# Patient Record
Sex: Female | Born: 1985 | Race: White | Hispanic: No | Smoking: Never smoker
Health system: Southern US, Community
[De-identification: ages and names within clinical notes are randomized; demographics above are authoritative.]

## PROBLEM LIST (undated history)

## (undated) DIAGNOSIS — R000 Tachycardia, unspecified: Secondary | ICD-10-CM

## (undated) DIAGNOSIS — Z8619 Personal history of other infectious and parasitic diseases: Secondary | ICD-10-CM

## (undated) DIAGNOSIS — T7840XA Allergy, unspecified, initial encounter: Secondary | ICD-10-CM

## (undated) DIAGNOSIS — K219 Gastro-esophageal reflux disease without esophagitis: Secondary | ICD-10-CM

## (undated) DIAGNOSIS — J386 Stenosis of larynx: Secondary | ICD-10-CM

## (undated) DIAGNOSIS — I10 Essential (primary) hypertension: Secondary | ICD-10-CM

## (undated) DIAGNOSIS — Z8041 Family history of malignant neoplasm of ovary: Secondary | ICD-10-CM

## (undated) DIAGNOSIS — O24419 Gestational diabetes mellitus in pregnancy, unspecified control: Secondary | ICD-10-CM

## (undated) HISTORY — DX: Personal history of other infectious and parasitic diseases: Z86.19

## (undated) HISTORY — DX: Gestational diabetes mellitus in pregnancy, unspecified control: O24.419

## (undated) HISTORY — DX: Family history of malignant neoplasm of ovary: Z80.41

## (undated) HISTORY — PX: TONSILLECTOMY: SUR1361

## (undated) HISTORY — DX: Essential (primary) hypertension: I10

## (undated) HISTORY — DX: Allergy, unspecified, initial encounter: T78.40XA

---
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
## 2005-01-04 ENCOUNTER — Emergency Department: Payer: Self-pay | Admitting: Emergency Medicine

## 2010-07-06 ENCOUNTER — Ambulatory Visit: Payer: Self-pay | Admitting: Family Medicine

## 2010-07-06 DIAGNOSIS — Z9189 Other specified personal risk factors, not elsewhere classified: Secondary | ICD-10-CM | POA: Insufficient documentation

## 2010-07-06 DIAGNOSIS — J301 Allergic rhinitis due to pollen: Secondary | ICD-10-CM

## 2010-07-06 DIAGNOSIS — F329 Major depressive disorder, single episode, unspecified: Secondary | ICD-10-CM

## 2010-07-14 ENCOUNTER — Ambulatory Visit: Payer: Self-pay | Admitting: Psychology

## 2010-08-11 ENCOUNTER — Ambulatory Visit: Payer: Self-pay | Admitting: Psychology

## 2010-09-16 ENCOUNTER — Ambulatory Visit: Admit: 2010-09-16 | Payer: Self-pay | Admitting: Psychology

## 2010-10-07 NOTE — Assessment & Plan Note (Signed)
Summary: 11:30 NEW PT PER DR COPLAND/CLE   Vital Signs:  Patient profile:   25 year old female Height:      68 inches Weight:      192.0 pounds BMI:     29.30 Temp:     97.8 degrees F oral Pulse rate:   76 / minute Pulse rhythm:   regular BP sitting:   120 / 78  (left arm)  Vitals Entered By: Mallory Pearson CMA Mallory Pearson) (July 06, 2010 11:32 AM)  History of Present Illness: Chief complaint new patient to be established   25 year old female:  Has beeen feeling beside herself.  Not herself. Crying. Went to the gym the other day.   Works about  40 hours a week at [employer] Thirty hours a week at [employer].  Mom and Dad is next door Crying some at work  Feels tired and not want to friends will make her    Preventive Screening-Counseling & Management  Alcohol-Tobacco     Smoking Status: never  Caffeine-Diet-Exercise     Does Patient Exercise: yes      Drug Use:  no.    Allergies (verified): 1)  ! Sulfa  Past History:  Past medical, surgical, family and social histories (including risk factors) reviewed, and no changes noted (except as noted below).  Past Medical History: Current Problems:  ALLERGIC RHINITIS, SEASONAL (ICD-477.0) CHICKENPOX, HX OF (ICD-V15.9)    Past Surgical History: tonsils and adenoids 2003 knee 2005  Family History: Reviewed history and no changes required.  Social History: Reviewed history and no changes required. Occupation:cna Single Never Smoked Alcohol use-yes Drug use-no Regular exercise-yes Occupation:  employed Smoking Status:  never Drug Use:  no Does Patient Exercise:  yes  Review of Systems Psych:  Complains of anxiety, depression, easily tearful, and irritability.  Physical Exam  General:  Well-developed,well-nourished,in no acute distress; alert,appropriate and cooperative throughout examination Head:  Normocephalic and atraumatic without obvious abnormalities. No apparent alopecia or balding. Ears:  no  external deformities.   Nose:  no external deformity.   Lungs:  Normal respiratory effort, chest expands symmetrically. Lungs are clear to auscultation, no crackles or wheezes. Heart:  Normal rate and regular rhythm. S1 and S2 normal without gallop, murmur, click, rub or other extra sounds. Psych:  labile affect and tearful.     Impression & Recommendations:  Problem # 1:  DEPRESSION (ICD-311) Assessment New >30 minutes spent in face to face time with patient, >50% spent in counselling or coordination of care: asked to see person acutely for new onset depression. For at least a month, crying all the time, does not feel herself. Sleeping 5-10 hours. Decreased interest and energy. Working 70+ hours a week, [place] and at [employer] buy. 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 No guns at home. No h/o psych hosp or suicide in the family. No hallucinations. No HI. Has had some fleeting thoughts of suicide, but no active plan, "would never actually do that," but some fleeting thoughts. With permission, called and spoke to the patient's mother, who I know as a pt. [employer]ory [employer] is going to spend the night with her mother and father to ensure that someone is around if needed at night over the next few weeks. They live next door. Start Celexa and close f/u with Dr. Laymond Pearson next week. To call if acutely doing worse. If worsens, she knows about psychiatric admission, ER.  Her updated medication list for this problem includes:    Citalopram  Hydrobromide 20 Mg Tabs (Citalopram hydrobromide) .Marland Kitchen... 1 by mouth daily  Orders: Psychology Referral (Psychology)  Complete Medication List: 1)  Citalopram Hydrobromide 20 Mg Tabs (Citalopram hydrobromide) .Marland Kitchen.. 1 by mouth daily  Patient Instructions: 1)  Referral Appointment Information 2)  Day/Date: 3)  Time: 4)  Place/MD: 5)  Address: 6)  Phone/Fax: 7)  Patient given appointment information. Information/Orders faxed/mailed.  8)  FOLLOW-UP IN 2 WEEKS Prescriptions: CITALOPRAM  HYDROBROMIDE 20 MG TABS (CITALOPRAM HYDROBROMIDE) 1 by mouth daily  #30 x 0   Entered and Authorized by:   Mallory Beat MD   Signed by:   Mallory Beat MD on 07/06/2010   Method used:   Print then Give to Patient   RxID:   [id number]    Orders Added: 1)  Psychology Referral [Psychology] 2)  New Patient Level III [14782]    Prior Medications (reviewed today): None Current Allergies (reviewed today): ! SULFA

## 2011-06-07 ENCOUNTER — Encounter: Payer: Self-pay | Admitting: Family Medicine

## 2011-06-08 ENCOUNTER — Encounter: Payer: Self-pay | Admitting: Family Medicine

## 2011-06-08 ENCOUNTER — Ambulatory Visit (INDEPENDENT_AMBULATORY_CARE_PROVIDER_SITE_OTHER): Payer: Managed Care, Other (non HMO) | Admitting: Family Medicine

## 2011-06-08 VITALS — BP 130/80 | HR 127 | Temp 98.4°F | Ht 67.0 in | Wt 212.4 lb

## 2011-06-08 DIAGNOSIS — R51 Headache: Secondary | ICD-10-CM

## 2011-06-08 DIAGNOSIS — R030 Elevated blood-pressure reading, without diagnosis of hypertension: Secondary | ICD-10-CM

## 2011-06-08 MED ORDER — LABETALOL HCL 100 MG PO TABS: 100.0000 mg | ORAL_TABLET | Freq: Two times a day (BID) | ORAL | Status: DC

## 2011-06-08 NOTE — Patient Instructions (Signed)
F/u with OB in 2 weeks

## 2011-06-08 NOTE — Progress Notes (Signed)
  Subjective:    Patient ID: Mallory Pearson, female    DOB: 1985-12-15, 25 y.o.   MRN: [id number]  HPI  Mallory Pearson, a 25 y.o. female presents today in the office for the following:    Patient well known who presents with repetitive blood pressures elevated above the goal of 130/80 during intrauterine pregnancy. She is [redacted] weeks pregnant. She is also been having some headaches over the last couple of weeks. These are not debilitating, do not have any aura or photophobia or photophobia.  F/u ? HTN: [redacted] weeks pregnant  OB is [facility]. And also Dr. Chauncey Cruel.  Tried caffeine, headaches would not go away. BP was 134/94 Was monitoring it at home -- has had some bad headaches --- was 150/96 150/88 at that point.  135/80  The PMH, PSH, Social History, Family History, Medications, and allergies have been reviewed in [facility], and have been updated if relevant.   Review of Systems ROS: GEN: No acute illnesses, no fevers, chills. GI: No n/v/d, eating normally Pulm: No SOB Interactive and getting along well at home.  Otherwise, ROS is as per the HPI.     Objective:   Physical Exam   Physical Exam  Blood pressure 130/80, pulse 127, temperature 98.4 F (36.9 C), temperature source Oral, height 5\' 7"  (1.702 m), weight 212 lb 6.4 oz (96.344 kg), SpO2 98.00%.  GEN: WDWN, NAD, Non-toxic, A & O x 3 HEENT: Atraumatic, Normocephalic. Neck supple. No masses, No LAD. Ears and Nose: No external deformity. CV: RRR, No M/G/R. No JVD. No thrill. No extra heart sounds. PULM: CTA B, no wheezes, crackles, rhonchi. No retractions. No resp. distress. No accessory muscle use EXTR: No c/c/e NEURO Normal gait.  PSYCH: Normally interactive. Conversant. Not depressed or anxious appearing.  Calm demeanor.        Assessment & Plan:   1. Elevated blood pressure   2. Headache     It is unclear at this is baseline hypertension or pregnancy-induced hypertension. Nevertheless during interuterine pregnancy at 13  weeks, recommendations would be to treat this repetitive elevated blood pressure. I have initiated the patient on labetalol 100 mg p.o. B.i.d. Also asked her to follow up with her obstetrician in 2 weeks.  Also wrote a letter to her obstetrician explaining the situation, findings, and starting her on labetalol.  I reassured her about her headaches, they may be related to her blood pressure. Certainly aside from controlling her blood pressure and using some occasional Tylenol, no other treatment is needed.

## 2011-08-25 ENCOUNTER — Encounter: Payer: Self-pay | Admitting: Family Medicine

## 2011-08-25 ENCOUNTER — Ambulatory Visit (INDEPENDENT_AMBULATORY_CARE_PROVIDER_SITE_OTHER): Payer: BC Managed Care – PPO | Admitting: Family Medicine

## 2011-08-25 VITALS — BP 120/72 | HR 112 | Temp 98.6°F | Ht 66.0 in | Wt 226.8 lb

## 2011-08-25 DIAGNOSIS — M654 Radial styloid tenosynovitis [de Quervain]: Secondary | ICD-10-CM

## 2011-08-25 DIAGNOSIS — G5600 Carpal tunnel syndrome, unspecified upper limb: Secondary | ICD-10-CM

## 2011-08-25 DIAGNOSIS — O139 Gestational [pregnancy-induced] hypertension without significant proteinuria, unspecified trimester: Secondary | ICD-10-CM

## 2011-08-25 DIAGNOSIS — G5603 Carpal tunnel syndrome, bilateral upper limbs: Secondary | ICD-10-CM

## 2011-08-25 DIAGNOSIS — IMO0002 Reserved for concepts with insufficient information to code with codable children: Secondary | ICD-10-CM

## 2011-08-25 MED ORDER — LABETALOL HCL 100 MG PO TABS: 100.0000 mg | ORAL_TABLET | Freq: Two times a day (BID) | ORAL | Status: DC

## 2011-08-25 NOTE — Progress Notes (Signed)
  Patient Name: Mallory Pearson Date of Birth: 09-14-1985 Age: 25 y.o. Medical Record Number: [id number] Gender: female  History of Present Illness:  Mallory Pearson is a 25 y.o. very pleasant female patient who presents with the following:  Pleasant female who is not bilateral hand numbness at all times, but is presented when the patient became pregnant. She is [redacted] weeks pregnant. She is at no trauma or injury.  She also has some pain with motion at the in the first dorsal compartment. 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  Pregnancy-induced hypertension, stable on current labetalol dose. No side effects.  Past Medical History, Surgical History, Social History, Family History, and Problem List have been reviewed in EHR and updated if relevant.  Review of Systems:  GEN: No fevers, chills. Nontoxic. Primarily MSK c/o today. MSK: Detailed in the HPI GI: tolerating PO intake without difficulty Neuro: detailed above Otherwise the pertinent positives of the ROS are noted above.   Physical Examination: Filed Vitals:   08/25/11 1207  BP: 120/72  Pulse: 112  Temp: 98.6 F (37 C)  TempSrc: Oral  Height: 5\' 6"  (1.676 m)  Weight: 226 lb 12.8 oz (102.876 kg)  SpO2: 99%    Body mass index is 36.61 kg/(m^2).   GEN: WDWN, NAD, Non-toxic, Alert & Oriented x 3 HEENT: Atraumatic, Normocephalic.  Ears and Nose: No external deformity. EXTR: No clubbing/cyanosis/edema NEURO: Normal gait.  PSYCH: Normally interactive. Conversant. Not depressed or anxious appearing.  Calm demeanor.   B hand Ecchymosis or edema: neg ROM wrist/hand/digits: full  Carpals, MCP's, digits: NT Distal Ulna and Radius: NT Ecchymosis or edema: neg No instability Cysts/nodules: There is a cyst on the right hand around the medial aspect of the radius distally. This does cause some discomfort with motion. Digit triggering: neg Finkelstein's test: pos Snuffbox tenderness: neg Scaphoid tubercle:  NT Resisted supination: NT Full composite fist, no malrotation Grip, all digits: 5/5 str DIPJT: NT PIP JT: NT MCP JT: NT No tenosynovitis Axial load test: neg Phalen's: pos Tinel's: pos Atrophy: neg  Hand sensation: intact   Assessment and Plan: 1. Carpal tunnel syndrome on both sides   2. DeQuervain's disease (tenosynovitis)   3. Pregnancy induced hypertension     Suspected carpal tunnel syndrome and nerve entrapment is likely pregnancy-induced. Certainly this often happens where edema from pregnancy and traps or compresses the median nerve. Advised her to continue conservative management until several months after her pregnancy. I placed her in a thumb spica splint bilaterally given that she also has de Quervain's tenosynovitis. She also takes Tylenol when necessary.  Refill labetalol

## 2011-11-12 ENCOUNTER — Observation Stay: Payer: Self-pay

## 2011-11-12 LAB — PIH PROFILE
Anion Gap: 12 (ref 7–16)
Chloride: 106 mmol/L (ref 98–107)
Co2: 21 mmol/L (ref 21–32)
Creatinine: 0.65 mg/dL (ref 0.60–1.30)
MCV: 86 fL (ref 80–100)
Potassium: 4.1 mmol/L (ref 3.5–5.1)
RBC: 3.97 10*6/uL (ref 3.80–5.20)
RDW: 13.1 % (ref 11.5–14.5)
Sodium: 139 mmol/L (ref 136–145)
Uric Acid: 4.8 mg/dL (ref 2.6–6.0)
WBC: 10.4 10*3/uL (ref 3.6–11.0)

## 2011-11-12 LAB — PROTEIN / CREATININE RATIO, URINE: Protein, Random Urine: 34 mg/dL — ABNORMAL HIGH (ref 0–12)

## 2011-11-18 ENCOUNTER — Inpatient Hospital Stay: Payer: Self-pay

## 2011-11-18 LAB — PIH PROFILE
Anion Gap: 12 (ref 7–16)
BUN: 13 mg/dL (ref 7–18)
Chloride: 105 mmol/L (ref 98–107)
Creatinine: 0.8 mg/dL (ref 0.60–1.30)
HCT: 33.2 % — ABNORMAL LOW (ref 35.0–47.0)
HGB: 11.4 g/dL — ABNORMAL LOW (ref 12.0–16.0)
MCHC: 34.2 g/dL (ref 32.0–36.0)
RBC: 3.89 10*6/uL (ref 3.80–5.20)
SGOT(AST): 20 U/L (ref 15–37)
WBC: 7.7 10*3/uL (ref 3.6–11.0)

## 2011-11-18 LAB — PROTEIN / CREATININE RATIO, URINE
Creatinine, Urine: 91.5 mg/dL (ref 30.0–125.0)
Protein, Random Urine: 71 mg/dL — ABNORMAL HIGH (ref 0–12)

## 2011-11-19 LAB — PROTEIN, URINE, 24 HOUR
Collection Hours: 24 hours
Protein, 24 Hour Urine: 782 mg/24HR — ABNORMAL HIGH (ref 30–149)
Protein, Urine: 34 mg/dL (ref 0–12)
Total Volume: 2300 mL

## 2011-11-19 LAB — CREATININE CLEARANCE, URINE, 24 HOUR
Creatinine Clearance: 142 mL/min — ABNORMAL HIGH (ref 80–130)
Creatinine, Urine: 89.3 mg/dL (ref 30.0–125.0)
Total Volume: 2300 mL

## 2011-11-21 LAB — PIH PROFILE
Anion Gap: 11 (ref 7–16)
Calcium, Total: 9 mg/dL (ref 8.5–10.1)
Chloride: 106 mmol/L (ref 98–107)
Co2: 22 mmol/L (ref 21–32)
Creatinine: 0.66 mg/dL (ref 0.60–1.30)
MCH: 28.9 pg (ref 26.0–34.0)
MCHC: 33.7 g/dL (ref 32.0–36.0)
MCV: 86 fL (ref 80–100)
Osmolality: 276 (ref 275–301)
Platelet: 174 10*3/uL (ref 150–440)
Potassium: 3.9 mmol/L (ref 3.5–5.1)
RBC: 3.9 10*6/uL (ref 3.80–5.20)
RDW: 13.3 % (ref 11.5–14.5)
Uric Acid: 5.5 mg/dL (ref 2.6–6.0)

## 2011-11-22 LAB — PROTEIN / CREATININE RATIO, URINE: Protein/Creat. Ratio: 370 mg/gCREAT — ABNORMAL HIGH (ref 0–200)

## 2011-11-23 LAB — PIH PROFILE
Anion Gap: 15 (ref 7–16)
Calcium, Total: 8.8 mg/dL (ref 8.5–10.1)
Chloride: 107 mmol/L (ref 98–107)
Co2: 20 mmol/L — ABNORMAL LOW (ref 21–32)
Glucose: 80 mg/dL (ref 65–99)
HCT: 33.1 % — ABNORMAL LOW (ref 35.0–47.0)
MCH: 28.6 pg (ref 26.0–34.0)
MCHC: 33.1 g/dL (ref 32.0–36.0)
MCV: 86 fL (ref 80–100)
Osmolality: 282 (ref 275–301)
Platelet: 168 10*3/uL (ref 150–440)
Potassium: 4.1 mmol/L (ref 3.5–5.1)
RDW: 13.2 % (ref 11.5–14.5)
SGOT(AST): 18 U/L (ref 15–37)
Sodium: 142 mmol/L (ref 136–145)
Uric Acid: 5.7 mg/dL (ref 2.6–6.0)
WBC: 9.4 10*3/uL (ref 3.6–11.0)

## 2011-11-25 LAB — CBC WITH DIFFERENTIAL/PLATELET
Basophil %: 0.2 %
Eosinophil #: 0 10*3/uL (ref 0.0–0.7)
Eosinophil %: 0.4 %
HCT: 34.5 % — ABNORMAL LOW (ref 35.0–47.0)
HGB: 11.9 g/dL — ABNORMAL LOW (ref 12.0–16.0)
Lymphocyte #: 1.5 10*3/uL (ref 1.0–3.6)
Lymphocyte %: 14.8 %
MCHC: 34.6 g/dL (ref 32.0–36.0)
MCV: 84 fL (ref 80–100)
Monocyte #: 0.7 10*3/uL (ref 0.0–0.7)
Monocyte %: 7.1 %
Neutrophil #: 7.8 10*3/uL — ABNORMAL HIGH (ref 1.4–6.5)
RBC: 4.1 10*6/uL (ref 3.80–5.20)
WBC: 10.1 10*3/uL (ref 3.6–11.0)

## 2011-11-26 DIAGNOSIS — O10919 Unspecified pre-existing hypertension complicating pregnancy, unspecified trimester: Secondary | ICD-10-CM

## 2011-11-27 LAB — HEMATOCRIT: HCT: 30.1 % — ABNORMAL LOW (ref 35.0–47.0)

## 2014-12-29 NOTE — Op Note (Signed)
PATIENT NAME:  Mallory Pearson, Mallory Pearson MR#:  [id number] DATE OF BIRTH:  Feb 14, 1986  DATE OF PROCEDURE:  11/26/2011  PREOPERATIVE DIAGNOSES: Term intrauterine pregnancy, hypertension, preeclampsia, failure to progress.   POSTOPERATIVE DIAGNOSES: Term intrauterine pregnancy, hypertension, preeclampsia, failure to progress.   PROCEDURE PERFORMED: Low transverse cesarean section.   SURGEON: Glean Salen, M.D.   ASSISTANT: Prentice Docker, M.D.  ANESTHESIA: Spinal.   ESTIMATED BLOOD LOSS: 500 mL.   COMPLICATIONS: None.   FINDINGS: Normal tubes, ovaries, and uterus. Viable female infant weighing 5 pounds 14 ounces with Apgar scores of 8 and 9 at one and five minutes, respectively.   DISPOSITION: To the recovery room in stable condition.   TECHNIQUE: The patient is prepped and draped in the usual sterile fashion after adequate anesthesia is obtained in the supine position on the operating room table. A skin incision is made with a scalpel that is extended in the horizontal fashion. The incision is carried down to the level of the rectus fascia which is then dissected bilaterally using Mayo scissors. The rectus muscles are dissected away from the rectus fascia and then separated in the midline, the peritoneum is penetrated and the bladder inferiorly retracted after careful dissection of the lower uterine segment. A scalpel is used to create a low transverse hysterotomy incision that is extended by blunt dissection and then amniotomy reveals clear fluid. The infant is delivered with the use of a vacuum and the nuchal cord is reduced. The infant is completely delivered and the umbilical cord is clamped and cut.   Cord blood is obtained, the placenta is manually extracted, and the uterus is externalized and cleansed of all membranes and debris using a moist sponge. The hysterotomy incision is closed with running #1 Vicryl suture in a locking fashion followed by a second layer to imbricate the first layer  with excellent hemostasis noted. The uterus is then placed back in the intra-abdominal cavity and the paracolic gutters are irrigated using warm saline. Re-examination of the incision reveals excellent hemostasis. Interceed is placed over the uterine incision. The peritoneum is closed with Vicryl suture. Two trocars are placed through the abdomen into the subfascial space with guidance of the Silver Soaker catheters into place. The fascia is closed with 0 Maxon suture with careful placement of sutures to avoid the catheters.  Subcutaneous tissues are irrigated with hemostasis assured using electrocautery. Skin is closed with subcuticular closure followed by Dermabond. The catheters are held in place by Dermabond and Steri-Strips. The patient goes to the Recovery Room in stable condition. All sponge, instrument, and needle counts are correct.  ____________________________ R. Barnett Applebaum, MD rph:slb D: 11/26/2011 16:55:40 ET T: 11/26/2011 17:13:24 ET JOB#: [id number]  cc: Glean Salen, MD, <Dictator> Gae Dry MD ELECTRONICALLY SIGNED 11/30/2011 9:51

## 2015-01-14 NOTE — H&P (Signed)
L&D Evaluation:  History Expanded:   HPI 29 yo G3P0020 whose EDC - 12/15/11.  PT's [renatal course complicated by developement of hypertension for which she now takes labetolol.  Pt referred from [facility] for a Bp 140/100    Blood Type A positive    Maternal HIV Negative    Maternal Syphilis Ab Nonreactive    Maternal Varicella Immune    Rubella Results immune    [facility] 15-Dec-2011    Patient's Medical History No Chronic Illness    Patient's Surgical History L knee surgery, eye, T+A    Medications Pre Natal Vitamins  Labetolol 200 bid    Allergies Sulfa, Hives    Social History none   ROS:   ROS All systems were reviewed.  HEENT, CNS, GI, GU, Respiratory, CV, Renal and Musculoskeletal systems were found to be normal.   Exam:   Vital Signs stable  127/80, 126/67    Urine Protein negative dipstick    General no apparent distress    Mental Status clear    Chest clear    Heart normal sinus rhythm    Abdomen gravid, non-tender    Estimated Fetal Weight Average for gestational age    Edema 1+    Reflexes 3+    Clonus negative   Impression:   Impression Poss Early PIH vs Chronic HBp   Plan:   Comments PIH profile, monitor   Electronic Signatures: Rosina Lowenstein (MD)  (Signed 08-Mar-13 13:19)  Authored: L&D Evaluation   Last Updated: 08-Mar-13 13:19 by Rosina Lowenstein (MD)

## 2015-01-14 NOTE — H&P (Signed)
L&D Evaluation:  History:   HPI 29 yo G3P0020 @ 36.1wks EDC 12/15/11 by LMP with CHTN on Labetalol presents from the office for PIH eval.  She was seen for routine visit today where BP was 150/110 with 1+ protein.  She had c/o intermittent dull HA and blurred vision.  Denies nausea/vomiting or pain.  +FM.  She has also had swelling of hands/feet.  BP has been mildly elevated to normal since admission.    Presents with elevated BP and proteinuria    Patient's Medical History Hypertension    Patient's Surgical History Knee surgery    Medications Pre Natal Vitamins  Labetalol 200mg  bid    Allergies Sulfa, (hives)    Social History none    Family History HTN   ROS:   ROS see HPI   Exam:   Vital Signs BP >140/90    Urine Protein 1+    General no apparent distress    Mental Status clear    Chest clear    Heart normal sinus rhythm    Abdomen gravid, non-tender    Edema 2+  Pitting    Pelvic Deferred    Mebranes Intact    Description clear    FHT normal rate with no decels    Ucx absent    Skin dry    Other PIH panel WNL except increase in P/C ratio from 502 to 776 in 1 wk.   Impression:   Impression Worsening CHTN vs CHTN with superimposed preeclampsia vs mild preeclampisa   Plan:   Comments 1)  Increase Labetalol to 300mg  bid.  Monitor BP and for s/sx of preeclampsia. 2)  Obs with 24hr urine collection. 3)  Discussed POC with pt and she understands the concern and need for further evaluation with possibility of early delivery if her clinical condition worsens.   Electronic Signatures: Donzetta Matters (MD)  (Signed 14-Mar-13 18:23)  Authored: L&D Evaluation   Last Updated: 14-Mar-13 18:23 by Donzetta Matters (MD)

## 2015-09-02 LAB — OB RESULTS CONSOLE HIV ANTIBODY (ROUTINE TESTING): HIV: NONREACTIVE

## 2015-09-02 LAB — OB RESULTS CONSOLE VARICELLA ZOSTER ANTIBODY, IGG: Varicella: IMMUNE

## 2015-09-02 LAB — OB RESULTS CONSOLE GC/CHLAMYDIA
Chlamydia: NEGATIVE
Gonorrhea: NEGATIVE

## 2015-09-02 LAB — OB RESULTS CONSOLE HEPATITIS B SURFACE ANTIGEN: HEP B S AG: NEGATIVE

## 2015-09-02 LAB — OB RESULTS CONSOLE RUBELLA ANTIBODY, IGM: Rubella: IMMUNE

## 2015-09-02 LAB — OB RESULTS CONSOLE RPR: RPR: NONREACTIVE

## 2015-10-20 ENCOUNTER — Encounter: Payer: BLUE CROSS/BLUE SHIELD | Attending: Obstetrics and Gynecology | Admitting: *Deleted

## 2015-10-20 ENCOUNTER — Encounter: Payer: Self-pay | Admitting: *Deleted

## 2015-10-20 VITALS — BP 136/84 | Ht 67.0 in | Wt 226.5 lb

## 2015-10-20 DIAGNOSIS — O24415 Gestational diabetes mellitus in pregnancy, controlled by oral hypoglycemic drugs: Secondary | ICD-10-CM

## 2015-10-20 DIAGNOSIS — O24419 Gestational diabetes mellitus in pregnancy, unspecified control: Secondary | ICD-10-CM | POA: Diagnosis present

## 2015-10-20 NOTE — Patient Instructions (Signed)
Read booklet on Gestational Diabetes Follow Gestational Meal Planning Guidelines Complete a 3 Day Food Record and bring to next appointment Check blood sugars 4 x day - before breakfast and 2 hrs after every meal and record  Bring blood sugar log to all appointments Purchase urine ketone strips if blood sugars not controlled and check urine ketones every am:  If + increase bedtime snack to 1 protein and 2 carbohydrate servings Walk 20-30 minutes at least 5 x week if permitted by MD  

## 2015-10-21 ENCOUNTER — Encounter: Payer: Self-pay | Admitting: *Deleted

## 2015-10-21 NOTE — Progress Notes (Signed)
Diabetes Self-Management Education  Visit Type: First/Initial  Appt. Start Time: 1505 Appt. End Time: [id number]  10/21/2015  Ms. Mallory Pearson, identified by name and date of birth, is a 30 y.o. female with a diagnosis of Diabetes: Gestational Diabetes.   ASSESSMENT  Blood pressure 136/84, height 5\' 7"  (1.702 m), weight 226 lb 8 oz (102.74 kg), last menstrual period 07/15/2015. Body mass index is 35.47 kg/(m^2).      Diabetes Self-Management Education - 10/20/15 1736    Visit Information   Visit Type First/Initial   Initial Visit   Diabetes Type Gestational Diabetes   Are you currently following a meal plan? Yes   What type of meal plan do you follow? "watching carbs and sugars; try not to eat more than 5 grams of each"   Are you taking your medications as prescribed? Yes   Date Diagnosed February 2017   Health Coping   How would you rate your overall health? Fair   Psychosocial Assessment   Patient Belief/Attitude about Diabetes Motivated to manage diabetes  "bad, nervous"   Self-care barriers None   Self-management support Doctor's office;Family   Patient Concerns Nutrition/Meal planning;Glycemic Control;Weight Control;Problem Solving;Healthy Lifestyle   Special Needs None   Preferred Learning Style Visual   Learning Readiness Change in progress   How often do you need to have someone help you when you read instructions, pamphlets, or other written materials from your doctor or pharmacy? 1 - Never   What is the last grade level you completed in school? BS   Complications   How often do you check your blood sugar? 3-4 times/day   Fasting Blood glucose range (mg/dL) 130-179;70-129  FBG's 119-155 mg/dL   Postprandial Blood glucose range (mg/dL) 130-179;70-129  pp's 105-145 mg/dL   Have you had a dilated eye exam in the past 12 months? Yes   Have you had a dental exam in the past 12 months? Yes   Are you checking your feet? No   Dietary Intake   Breakfast grapes, cheese,  peanuts   Snack (morning) trukey sausage   Lunch chicken, salads   Snack (afternoon) peanuts   Dinner steak and vegetables; chicken; salad   Beverage(s) water   Exercise   Exercise Type ADL's   Patient Education   Previous Diabetes Education No   Disease state  Definition of diabetes, type 1 and 2, and the diagnosis of diabetes   Nutrition management  Role of diet in the treatment of diabetes and the relationship between the three main macronutrients and blood glucose level;Carbohydrate counting   Physical activity and exercise  Role of exercise on diabetes management, blood pressure control and cardiac health.   Medications Reviewed patients medication for diabetes, action, purpose, timing of dose and side effects.   Monitoring Purpose and frequency of SMBG.;Identified appropriate SMBG and/or A1C goals.   Acute complications Taught treatment of hypoglycemia - the 15 rule.   Chronic complications Relationship between chronic complications and blood glucose control   Psychosocial adjustment Identified and addressed patients feelings and concerns about diabetes   Preconception care Pregnancy and GDM  Role of pre-pregnancy blood glucose control on the development of the fetus;Reviewed with patient blood glucose goals with pregnancy   Individualized Goals (developed by patient)   Reducing Risk Improve blood sugars Prevent diabetes complications Lose weight Lead a healthier lifestyle Become more fit   Outcomes   Expected Outcomes Demonstrated interest in learning. Expect positive outcomes      Individualized Plan for Diabetes  Self-Management Training:   Learning Objective:  Patient will have a greater understanding of diabetes self-management. Patient education plan is to attend individual and/or group sessions per assessed needs and concerns.   Plan:   Patient Instructions  Read booklet on Gestational Diabetes Follow Gestational Meal Planning Guidelines Complete a 3 Day Food  Record and bring to next appointment Check blood sugars 4 x day - before breakfast and 2 hrs after every meal and record  Bring blood sugar log to all appointments Purchase urine ketone strips if blood sugars not controlled and check urine ketones every am:  If + increase bedtime snack to 1 protein and 2 carbohydrate servings Walk 20-30 minutes at least 5 x week if permitted by MD  Expected Outcomes:  Demonstrated interest in learning. Expect positive outcomes  Education material provided:  Gestational Booklet Gestational Meal Planning Guidelines Viewed Gestational Diabetes Video Glucose tabs 3 Day Food Record Goals for a Healthy Pregnancy Causes, symptoms and treatments for low blood sugars  If problems or questions, patient to contact team via:   Johny Drilling, [place], [place], CDE [phone]  Future DSME appointment:  November 06, 2015 at 1:00 pm with dietitian

## 2015-11-06 ENCOUNTER — Encounter: Payer: Self-pay | Admitting: Dietician

## 2015-11-06 ENCOUNTER — Encounter: Payer: BLUE CROSS/BLUE SHIELD | Attending: Obstetrics and Gynecology | Admitting: Dietician

## 2015-11-06 VITALS — BP 130/76 | Ht 67.0 in | Wt 227.5 lb

## 2015-11-06 DIAGNOSIS — O24419 Gestational diabetes mellitus in pregnancy, unspecified control: Secondary | ICD-10-CM | POA: Insufficient documentation

## 2015-11-06 DIAGNOSIS — O24415 Gestational diabetes mellitus in pregnancy, controlled by oral hypoglycemic drugs: Secondary | ICD-10-CM

## 2015-11-06 NOTE — Progress Notes (Signed)
   Patient's BG record indicates BGs improved since starting glyburide, but some still above goal; FBGs ranging 92-107mg /dl, and post-meal BGs ranging 78-150.  She reports extra stress, as her husband is currently in [place], and will be there until May. She is trying to manage work, pregnancy, and 61yr-old child by herself.   Patient's food diary indicates healthy food choices, many meals low in carbohydrate.    Provided 1900kcal meal plan, and wrote individualized menus based on patient's food preferences. Encouraged at least 30g carb with each meal, and a total of at least 9 carb servings daily.   Discussed role of stress and exercise on BG control. Encouraged patient to begin with 10-15 min. light exercise if able.   Instructed patient on food safety, including avoidance of Listeriosis, and limiting mercury from fish.  Discussed importance of maintaining healthy lifestyle habits to reduce risk of Type 2 DM as well as Gestational DM with any future pregnancies.  Advised patient to use any remaining testing supplies to test some BGs after delivery, and to have BG tested ideally annually, as well as prior to attempting future pregnancies.

## 2015-11-06 NOTE — Patient Instructions (Signed)
   Begin walking 10-15 minutes at a time.   Include at least 30g carbohydrate with meals.   If you are still hungry after eating, add extra low-carb vegetables or protein food.   Call as needed with any questions or concerns.

## 2016-02-16 ENCOUNTER — Ambulatory Visit: Payer: Managed Care, Other (non HMO)

## 2016-02-16 ENCOUNTER — Ambulatory Visit
Admission: RE | Admit: 2016-02-16 | Discharge: 2016-02-16 | Disposition: A | Discharge status: Home / Self Care | Payer: Managed Care, Other (non HMO) | Source: Ambulatory Visit | Attending: Obstetrics & Gynecology | Admitting: Obstetrics & Gynecology

## 2016-02-16 VITALS — BP 119/80 | HR 119 | Temp 97.5°F | Resp 20 | Ht 66.0 in | Wt 234.0 lb

## 2016-02-16 DIAGNOSIS — O24419 Gestational diabetes mellitus in pregnancy, unspecified control: Secondary | ICD-10-CM

## 2016-02-16 NOTE — Progress Notes (Signed)
Maternal Fetal Medicine Consult   CC: Advice Only  HPI:  Mallory Pearson is a 30 y.o. G3P1010 @ [redacted]w[redacted]d (by LMP c/w 9 wk [place]) who presents for MFM consultation due to suboptimally controlled gestational diabetes type A2.   PMHx: Patient  has a past medical history of Allergy; History of chicken pox; Hypertension; and Gestational diabetes.  PSHx: She  has past surgical history that includes Tonsillectomy and adenoidectomy; Knee surgery (2005); and Cesarean section (11/26/11). Medications:  Current Outpatient Prescriptions on File Prior to Encounter  Medication Sig Dispense Refill  . glyBURIDE (DIABETA) 2.5 MG tablet Take 4 mg by mouth daily with supper.     . Prenatal Vit-Fe Fumarate-FA (MULTIVITAMIN-PRENATAL) 27-0.8 MG TABS Take 1 tablet by mouth daily. Reported on 10/20/2015     No current facility-administered medications on file prior to encounter.   Allergies: is allergic to sulfa antibiotics and sulfonamide derivatives. OBHx:  OB History  Gravida Para Term Preterm AB SAB TAB Ectopic Multiple Living  3 1 1  1 1         # Outcome Date GA Lbr Len/2nd Weight Sex Delivery Anes PTL Lv  3 Current           2 Term      CS-Unspec     1 SAB                FHx: family history is not on file. Soc Hx:  reports that she has never smoked. She has never used smokeless tobacco. She reports that she does not drink alcohol or use illicit drugs.    Objective:  BP 119/80 mmHg  Pulse 119  Temp(Src) 97.5 F (36.4 C)  Resp 20  Ht 5\' 6"  (1.676 m)  Wt 234 lb (106.142 kg)  BMI 37.79 kg/m2  SpO2 99%  LMP 07/15/2015 Temp:  [97.5 F (36.4 C)] 97.5 F (36.4 C) (06/12 1410) Pulse Rate:  [119] 119 (06/12 1410) Resp:  [20] 20 (06/12 1410) BP: (119)/(80) 119/80 mmHg (06/12 1410) SpO2:  [99 %] 99 % (06/12 1410) Weight:  [234 lb (106.142 kg)] 234 lb (106.142 kg) (06/12 1410) Exam Deferred  Assessment & Plan:  Mallory Pearson is a 30 y.o. G3P1010 @ [redacted]w[redacted]d (by LMP c/w 9 wk sono) who presents for MFM  consultation due to suboptimally controlled GDMA2.   Ms. Toon was diagnosed with gestational diabetes based on an abnormal first trimester 1 hr GCT (172) followed by an abnormal 3 hour GTT (107/204/198/145).  Ms. Lyster has been followed by her primary ob group [facility]) and is currently on glyburide 1.25 with lunch and 3.75 in the evening.  Ms. Curatola was previously on 2.5mg  in the morning and 5 mg at night, however her dose was decreased due to "low sugars".  She reports her low sugars were in the mid 60-70's.  She also reports feeling jittery at this glucose level.  Based on her symptoms she was decreased to her current regimen on 01/15/16.  Ms. Norton keeps meticulous logs of her glucose checks.  On review of her log from 5/12 to today (6/12), her fasting sugars are suboptimal (90-100's).  Although 24/32 of her fasting values are out of range the highest is 116. Our typical goal for fasting sugars is 60-90.  Her 2-hour post meal sugars better than the fastings goal ranging from the 80-130's with only 6/32 breakfast, 11/32 lunch and 22/32 values out of range with a max of 162.  Our typical goal for 2  hours post meal glucose is less than 120.    Per Ms. Frieze, her last growth ultrasound on 5/22 was within normal limits at the 75th percentile.  We discussed options to maximize her control including attempting a glyburide increase, switching to metformin or initiating insulin.  I am concerned that Ms. Oman will have more side effects with metformin and would be more likely to have episodes of true hypoglycemia on insulin.  We discussed that her previous "low sugars" are actually considered within the normal range.  After discussing the options we decided to attempt an increase in her glyburide to 5mg  qhs while continuing the 1.25mg  with lunch.  We discussed the natural history of diabetes to worsen during pregnancy therefore she may tolerate this dose better now that she's further along.  If Ms. Veal  is unable to to this increased glyburide dose we are happy to see her again and consider either switching to metformin or very careful insulin titration.  If Ms. Mathus is able to tolerate the increased pm glyburide she may also require gentle increases to her lunch time dose as well.  Only the nighttime dose was increased today to try and minimize symptomatology from the medication.    We further recommend continued serial growth scans ever 4 weeks.  We would also recommend increased monitoring with twice weekly BPP or twice weekly NST with weekly fluid checks starting at 34 weeks. Approximately 30 minutes were spent in face to face consultation and coordination of care.   We are happy to provide further consultation, ultrasounds and/or testing as desired.    Thank you for involving our group in the care of this patient. Louie Bun, MD

## 2016-03-15 ENCOUNTER — Ambulatory Visit
Admission: RE | Admit: 2016-03-15 | Discharge: 2016-03-15 | Disposition: A | Discharge status: Home / Self Care | Payer: Managed Care, Other (non HMO) | Source: Ambulatory Visit | Attending: Maternal and Fetal Medicine | Admitting: Maternal and Fetal Medicine

## 2016-03-15 VITALS — BP 134/80 | HR 128 | Temp 98.4°F | Resp 20 | Ht 66.0 in | Wt 236.0 lb

## 2016-03-15 DIAGNOSIS — O24419 Gestational diabetes mellitus in pregnancy, unspecified control: Secondary | ICD-10-CM

## 2016-03-15 MED ORDER — SYRINGE (DISPOSABLE) 1 ML MISC: Status: DC

## 2016-03-15 MED ORDER — INSULIN NPH (HUMAN) (ISOPHANE) 100 UNIT/ML ~~LOC~~ SUSP: SUBCUTANEOUS | Status: DC

## 2016-03-15 MED ORDER — ALCOHOL PADS 70 % PADS: MEDICATED_PAD | Status: DC

## 2016-03-15 MED ORDER — INSULIN LISPRO 100 UNIT/ML ~~LOC~~ SOLN: SUBCUTANEOUS | Status: DC

## 2016-03-15 NOTE — Progress Notes (Signed)
Dear Dr Leonides Schanz,  Thank you for referring MS Mallory Pearson to [facility] for ongoing management of her gestational diabetes. We reviewed the importance of good glucose control to minimize fetal and maternal risks.   Ms Mallory Pearson has tried glyburide and now is taking both metformin and glyburide with continued suboptimal control of her gestational diabetes. Her current control is as follows: 8/15 fasting abnormal (75-127) 7/13 PB abnormal (97-225) 5/14 PL abnormal (84-141) 10/12 PD abnormal (117-203)  We discussed the goals and the failure of therapy despite multiple medication regimens. Today, I recommended insulin therapy and the patient was in agreement with this.  The goals of her insulin regimen is to have: 1. Fasting a capillary blood glucose between 60 and 95 mg/dl; 2. Two hour postprandial capillary blood glucoses less than 120 mg/dl.  In addition, we have the following recommendations:  1. Ultrasound for fetal growth every 4 weeks to continue 2. Twice weekly nonstress tests 3. Delivery planning will be determined once the patient is closer to her estimated due date. This will be determined by:    A. Fetal size;    B. Amniotic fluid volume;    C. Level of glucose control;    D. Other medical or obstetrical complications.  We will be happy to be involved in the ongoing care of Mallory Pearson in anyway her obstetricians desire. We will see her weekly to adjust her insulin as needed.  Coralie Keens, MD

## 2016-03-19 ENCOUNTER — Encounter: Payer: BC Managed Care – PPO | Admitting: Obstetrics and Gynecology

## 2016-03-22 ENCOUNTER — Ambulatory Visit
Admission: RE | Admit: 2016-03-22 | Discharge: 2016-03-22 | Disposition: A | Discharge status: Home / Self Care | Payer: Managed Care, Other (non HMO) | Source: Ambulatory Visit | Attending: Obstetrics & Gynecology | Admitting: Obstetrics & Gynecology

## 2016-03-22 VITALS — BP 133/89 | HR 114 | Temp 98.3°F | Resp 20 | Ht 66.0 in | Wt 244.0 lb

## 2016-03-22 DIAGNOSIS — O24419 Gestational diabetes mellitus in pregnancy, unspecified control: Secondary | ICD-10-CM | POA: Diagnosis not present

## 2016-03-22 MED ORDER — INSULIN LISPRO 100 UNIT/ML ~~LOC~~ SOLN: 16.0000 [IU] | Freq: Three times a day (TID) | SUBCUTANEOUS | Status: DC

## 2016-03-22 MED ORDER — INSULIN GLARGINE 100 UNIT/ML ~~LOC~~ SOLN: 36.0000 [IU] | Freq: Every day | SUBCUTANEOUS | Status: DC

## 2016-03-22 NOTE — Progress Notes (Signed)
[facility] [place]: Mallory Pearson returns to review her sugar log. She was seen on July 1 and transitioned from oral agents (glyburide and metformin) to insulin. She was started on:  NPH 32/14 and Humalog 16 at breakfast, none at lunch and 14 at dinner. She has no complaints. She has a growth scan scheduled this Thursday at [facility] and is having twice-weekly testing. She is asking if she can have her NSTs on Monday's with [place] as she'll be here to review her logs.  Exam: Filed Vitals:   03/22/16 1541  BP: 133/89  Pulse: 114  Temp: 98.3 F (36.8 C)  Resp: 20   Accuchecks (03/15/16 - 03/22/16): FS 92, 94, 91, 102, 91, 95, 94, 90 2hr PP Breakfast: 119, 106, 120, 116, 99, 123, 104 2hr PP Lunch: 111, 138, 98, 170, 150, 147, 143, 186 2hr PP Dinner: 119, 113, 198, 188, 158, 140  Assessment and Recs: Need better lunch and dinner control. Will discontinue her NPH and adjust her insulin as follows: -Stop NPH -Start Lantus 34 Units at Bedtime -Use Humalog now at all three meals including lunch (16 at each meal) -RTC with log in one week -Continue twice weekly testing. NSTs at [facility] on Mondays per pt request and AFI/NST at [facility] on Thursdays -Growth scan at [facility] on Thursday -Insulin scripts sent electronically to her pharmacy.  Mallory Pearson, [place] A, MD

## 2016-03-25 LAB — OB RESULTS CONSOLE GBS: GBS: NEGATIVE

## 2016-03-29 ENCOUNTER — Ambulatory Visit
Admission: RE | Admit: 2016-03-29 | Discharge: 2016-03-29 | Disposition: A | Discharge status: Home / Self Care | Payer: Managed Care, Other (non HMO) | Source: Ambulatory Visit | Attending: Obstetrics and Gynecology | Admitting: Obstetrics and Gynecology

## 2016-03-29 DIAGNOSIS — O24419 Gestational diabetes mellitus in pregnancy, unspecified control: Secondary | ICD-10-CM

## 2016-03-29 NOTE — Progress Notes (Signed)
[facility]  Mallory Pearson returns for NST and follow up review of her BS.  She was seen on July 1 and transitioned from oral agents (glyburide and metformin) to insulin.  On 7/17 her NPH was discontinued and she was started on Lantus qhs.  She had a growth [place] at [place] last Thursday with an EFW at the [place] percentile but the [facility] was >98th percentile (suggestive of fetopathy).  She reports elevated BPs in the mornings with HA.  She had preeclampsia labs in the office last week (these were also requested).  Current insulin regimen:   Lantus 36 U qhs Humalog 16/16/16  NST:  160 with change to 150 baseline Moderate variability 15x15 accels; no decels No regular uterine activity REACTIVE NST  BP: 137/89   Pulse: 110 Temp: 98.5 Weight:243 #  Accuchecks (03/22/16-03/29/16) FS:  94, 93, 84, 96, 91, 84, 80 2hr PP breakfast:  109, 120, 114, 115, 121, 87 2hr PP lunch:  109, 95, 116, 131, 120, 113 2hr PP dinner:  121, 152, 129, 132, 153, 109  A/P:  30yo G3P1010 at [redacted]w[redacted]d (by LMP c/s 9wk [place]) with diabetes and history of preeclampsia requiring induction (leading to c/s) at 37 weeks. - elevated BPs compared to baseline - monitor for s/sx of preeclampsia.  Continue baby ASA.   - diabetes with continued need for optimization.  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   Change insulin regimen to:  Lantus 38 U qhs  Humalog 16/16/18 - NST today reactive - Would strongly consider delivery around 37 weeks given combination of increasing BPs and diabetes with evidence of fetopathy

## 2016-04-01 ENCOUNTER — Encounter: Payer: Self-pay | Admitting: Anesthesiology

## 2016-04-01 ENCOUNTER — Inpatient Hospital Stay
Admission: EM | Admit: 2016-04-01 | Discharge: 2016-04-04 | DRG: 765 | Disposition: A | Discharge status: Home / Self Care | Payer: Managed Care, Other (non HMO) | Attending: Obstetrics & Gynecology | Admitting: Obstetrics & Gynecology

## 2016-04-01 ENCOUNTER — Inpatient Hospital Stay: Payer: Managed Care, Other (non HMO) | Admitting: Anesthesiology

## 2016-04-01 ENCOUNTER — Encounter
Admission: EM | Disposition: A | Discharge status: Home / Self Care | Payer: Self-pay | Source: Home / Self Care | Attending: Obstetrics & Gynecology

## 2016-04-01 ENCOUNTER — Inpatient Hospital Stay
Admission: AD | Admit: 2016-04-01 | Payer: Managed Care, Other (non HMO) | Source: Ambulatory Visit | Admitting: Obstetrics & Gynecology

## 2016-04-01 DIAGNOSIS — Z882 Allergy status to sulfonamides status: Secondary | ICD-10-CM | POA: Diagnosis not present

## 2016-04-01 DIAGNOSIS — O1092 Unspecified pre-existing hypertension complicating childbirth: Secondary | ICD-10-CM | POA: Diagnosis present

## 2016-04-01 DIAGNOSIS — O10019 Pre-existing essential hypertension complicating pregnancy, unspecified trimester: Secondary | ICD-10-CM | POA: Diagnosis present

## 2016-04-01 DIAGNOSIS — O24424 Gestational diabetes mellitus in childbirth, insulin controlled: Secondary | ICD-10-CM | POA: Diagnosis present

## 2016-04-01 DIAGNOSIS — O34211 Maternal care for low transverse scar from previous cesarean delivery: Secondary | ICD-10-CM | POA: Diagnosis present

## 2016-04-01 DIAGNOSIS — Z3A37 37 weeks gestation of pregnancy: Secondary | ICD-10-CM

## 2016-04-01 DIAGNOSIS — Z98891 History of uterine scar from previous surgery: Secondary | ICD-10-CM

## 2016-04-01 LAB — COMPREHENSIVE METABOLIC PANEL
ALK PHOS: 83 U/L (ref 38–126)
ALT: 12 U/L — ABNORMAL LOW (ref 14–54)
ANION GAP: 10 (ref 5–15)
AST: 16 U/L (ref 15–41)
Albumin: 3.4 g/dL — ABNORMAL LOW (ref 3.5–5.0)
BILIRUBIN TOTAL: 0.8 mg/dL (ref 0.3–1.2)
BUN: 11 mg/dL (ref 6–20)
CALCIUM: 9.2 mg/dL (ref 8.9–10.3)
CO2: 20 mmol/L — ABNORMAL LOW (ref 22–32)
Chloride: 107 mmol/L (ref 101–111)
Creatinine, Ser: 0.49 mg/dL (ref 0.44–1.00)
GFR calc non Af Amer: >60 mL/min (ref >60)
Glucose, Bld: 73 mg/dL (ref 65–99)
Potassium: 3.9 mmol/L (ref 3.5–5.1)
Sodium: 137 mmol/L (ref 135–145)
TOTAL PROTEIN: 6.6 g/dL (ref 6.5–8.1)

## 2016-04-01 LAB — GLUCOSE, CAPILLARY
Glucose-Capillary: 62 mg/dL — ABNORMAL LOW (ref 65–99)
Glucose-Capillary: 81 mg/dL (ref 65–99)

## 2016-04-01 LAB — CBC
HEMATOCRIT: 39.4 % (ref 35.0–47.0)
Hemoglobin: 13.5 g/dL (ref 12.0–16.0)
MCH: 29 pg (ref 26.0–34.0)
MCHC: 34.3 g/dL (ref 32.0–36.0)
MCV: 84.5 fL (ref 80.0–100.0)
Platelets: 188 10*3/uL (ref 150–440)
RBC: 4.66 MIL/uL (ref 3.80–5.20)
RDW: 14.1 % (ref 11.5–14.5)
WBC: 10.9 10*3/uL (ref 3.6–11.0)

## 2016-04-01 LAB — RAPID HIV SCREEN (HIV 1/2 AB+AG)
HIV 1/2 ANTIBODIES: NONREACTIVE
HIV-1 P24 ANTIGEN - HIV24: NONREACTIVE

## 2016-04-01 LAB — TYPE AND SCREEN
ABO/RH(D): A POS
ANTIBODY SCREEN: NEGATIVE

## 2016-04-01 LAB — URIC ACID: URIC ACID, SERUM: 5 mg/dL (ref 2.3–6.6)

## 2016-04-01 SURGERY — Surgical Case
Anesthesia: Spinal | Site: Abdomen | Wound class: Clean Contaminated

## 2016-04-01 MED ORDER — BUPIVACAINE HCL 0.5 % IJ SOLN
10.0000 mL | Freq: Once | INTRAMUSCULAR | Status: DC
  Filled 2016-04-01: qty 10

## 2016-04-01 MED ORDER — OXYCODONE HCL 5 MG/5ML PO SOLN: 5.0000 mg | Freq: Once | ORAL | Status: DC | PRN

## 2016-04-01 MED ORDER — BUPIVACAINE IN DEXTROSE 0.75-8.25 % IT SOLN
INTRATHECAL | Status: DC | PRN
  Administered 2016-04-01: 1.8 mL via INTRATHECAL

## 2016-04-01 MED ORDER — SIMETHICONE 80 MG PO CHEW
80.0000 mg | CHEWABLE_TABLET | ORAL | Status: DC | PRN
  Administered 2016-04-02: 80 mg via ORAL

## 2016-04-01 MED ORDER — CEFOXITIN SODIUM-DEXTROSE 2-2.2 GM-% IV SOLR (PREMIX)
INTRAVENOUS | Status: AC
  Administered 2016-04-01: 19:00:00
  Filled 2016-04-01: qty 50

## 2016-04-01 MED ORDER — NALOXONE HCL 2 MG/2ML IJ SOSY
1.0000 ug/kg/h | PREFILLED_SYRINGE | INTRAVENOUS | Status: DC | PRN
  Filled 2016-04-01: qty 2

## 2016-04-01 MED ORDER — DIPHENHYDRAMINE HCL 25 MG PO CAPS: 25.0000 mg | ORAL_CAPSULE | Freq: Four times a day (QID) | ORAL | Status: DC | PRN

## 2016-04-01 MED ORDER — OXYCODONE-ACETAMINOPHEN 5-325 MG PO TABS
2.0000 | ORAL_TABLET | ORAL | Status: DC | PRN
  Administered 2016-04-02 – 2016-04-03 (×2): 2 via ORAL
  Filled 2016-04-01 (×4): qty 2

## 2016-04-01 MED ORDER — OXYTOCIN 40 UNITS IN LACTATED RINGERS INFUSION - SIMPLE MED
INTRAVENOUS | Status: AC
  Filled 2016-04-01: qty 1000

## 2016-04-01 MED ORDER — KETOROLAC TROMETHAMINE 30 MG/ML IJ SOLN
30.0000 mg | Freq: Four times a day (QID) | INTRAMUSCULAR | Status: DC | PRN
  Administered 2016-04-01: 30 mg via INTRAVENOUS
  Filled 2016-04-01 (×2): qty 1

## 2016-04-01 MED ORDER — KETOROLAC TROMETHAMINE 30 MG/ML IJ SOLN: 30.0000 mg | Freq: Four times a day (QID) | INTRAMUSCULAR | Status: DC | PRN

## 2016-04-01 MED ORDER — KETOROLAC TROMETHAMINE 30 MG/ML IJ SOLN
30.0000 mg | Freq: Four times a day (QID) | INTRAMUSCULAR | Status: DC
Start: 2016-04-02 — End: 2016-04-02
  Administered 2016-04-02 (×3): 30 mg via INTRAVENOUS
  Filled 2016-04-01 (×2): qty 1

## 2016-04-01 MED ORDER — OXYCODONE-ACETAMINOPHEN 5-325 MG PO TABS
1.0000 | ORAL_TABLET | ORAL | Status: DC | PRN
  Administered 2016-04-02 – 2016-04-04 (×8): 1 via ORAL
  Filled 2016-04-01 (×7): qty 1

## 2016-04-01 MED ORDER — DIPHENHYDRAMINE HCL 50 MG/ML IJ SOLN: 12.5000 mg | INTRAMUSCULAR | Status: DC | PRN

## 2016-04-01 MED ORDER — BUPIVACAINE 0.25 % ON-Q PUMP DUAL CATH 400 ML
INJECTION | Status: AC
  Filled 2016-04-01: qty 400

## 2016-04-01 MED ORDER — BUPIVACAINE 0.25 % ON-Q PUMP DUAL CATH 400 ML: 400.0000 mL | INJECTION | Status: DC

## 2016-04-01 MED ORDER — DEXTROSE 5 % IN LACTATED RINGERS IV BOLUS: 500.0000 mL | Freq: Once | INTRAVENOUS | Status: DC

## 2016-04-01 MED ORDER — SIMETHICONE 80 MG PO CHEW
80.0000 mg | CHEWABLE_TABLET | Freq: Three times a day (TID) | ORAL | Status: DC
  Filled 2016-04-01: qty 1

## 2016-04-01 MED ORDER — OXYCODONE HCL 5 MG PO TABS: 5.0000 mg | ORAL_TABLET | Freq: Once | ORAL | Status: DC | PRN

## 2016-04-01 MED ORDER — PRENATAL MULTIVITAMIN CH
1.0000 | ORAL_TABLET | Freq: Every day | ORAL | Status: DC
  Administered 2016-04-02 – 2016-04-04 (×3): 1 via ORAL
  Filled 2016-04-01 (×3): qty 1

## 2016-04-01 MED ORDER — FENTANYL CITRATE (PF) 100 MCG/2ML IJ SOLN: 25.0000 ug | INTRAMUSCULAR | Status: DC | PRN

## 2016-04-01 MED ORDER — ACETAMINOPHEN 325 MG PO TABS: 650.0000 mg | ORAL_TABLET | ORAL | Status: DC | PRN

## 2016-04-01 MED ORDER — NALBUPHINE HCL 10 MG/ML IJ SOLN: 5.0000 mg | INTRAMUSCULAR | Status: DC | PRN

## 2016-04-01 MED ORDER — MORPHINE SULFATE (PF) 0.5 MG/ML IJ SOLN
INTRAMUSCULAR | Status: DC | PRN
  Administered 2016-04-01: .1 mg via INTRATHECAL

## 2016-04-01 MED ORDER — OXYTOCIN 40 UNITS IN LACTATED RINGERS INFUSION - SIMPLE MED: 2.5000 [IU]/h | INTRAVENOUS | Status: AC

## 2016-04-01 MED ORDER — HEPARIN SODIUM (PORCINE) 5000 UNIT/ML IJ SOLN: 5000.0000 [IU] | INTRAMUSCULAR | Status: DC

## 2016-04-01 MED ORDER — OXYTOCIN 40 UNITS IN LACTATED RINGERS INFUSION - SIMPLE MED
INTRAVENOUS | Status: DC | PRN
  Administered 2016-04-01: 40 mL via INTRAVENOUS

## 2016-04-01 MED ORDER — HEPARIN SODIUM (PORCINE) 5000 UNIT/ML IJ SOLN
5000.0000 [IU] | INTRAMUSCULAR | Status: DC
  Filled 2016-04-01: qty 1

## 2016-04-01 MED ORDER — SIMETHICONE 80 MG PO CHEW: 80.0000 mg | CHEWABLE_TABLET | ORAL | Status: DC

## 2016-04-01 MED ORDER — ZOLPIDEM TARTRATE 5 MG PO TABS: 5.0000 mg | ORAL_TABLET | Freq: Every evening | ORAL | Status: DC | PRN

## 2016-04-01 MED ORDER — MENTHOL 3 MG MT LOZG: 1.0000 | LOZENGE | OROMUCOSAL | Status: DC | PRN

## 2016-04-01 MED ORDER — BUPIVACAINE HCL 0.25 % IJ SOLN
INTRAMUSCULAR | Status: DC | PRN
  Administered 2016-04-01: 10 mL

## 2016-04-01 MED ORDER — SODIUM CHLORIDE 0.9% FLUSH: 3.0000 mL | INTRAVENOUS | Status: DC | PRN

## 2016-04-01 MED ORDER — NALBUPHINE HCL 10 MG/ML IJ SOLN: 5.0000 mg | Freq: Once | INTRAMUSCULAR | Status: DC | PRN

## 2016-04-01 MED ORDER — DIPHENHYDRAMINE HCL 25 MG PO CAPS: 25.0000 mg | ORAL_CAPSULE | ORAL | Status: DC | PRN

## 2016-04-01 MED ORDER — SENNOSIDES-DOCUSATE SODIUM 8.6-50 MG PO TABS
2.0000 | ORAL_TABLET | ORAL | Status: DC
  Administered 2016-04-02 – 2016-04-03 (×3): 2 via ORAL
  Filled 2016-04-01 (×3): qty 2

## 2016-04-01 MED ORDER — LACTATED RINGERS IV SOLN: INTRAVENOUS | Status: DC

## 2016-04-01 MED ORDER — INSULIN ASPART 100 UNIT/ML ~~LOC~~ SOLN: 0.0000 [IU] | SUBCUTANEOUS | Status: DC

## 2016-04-01 MED ORDER — DIBUCAINE 1 % RE OINT: 1.0000 "application " | TOPICAL_OINTMENT | RECTAL | Status: DC | PRN

## 2016-04-01 MED ORDER — ONDANSETRON HCL 4 MG/2ML IJ SOLN: 4.0000 mg | Freq: Three times a day (TID) | INTRAMUSCULAR | Status: DC | PRN

## 2016-04-01 MED ORDER — FENTANYL CITRATE (PF) 100 MCG/2ML IJ SOLN
INTRAMUSCULAR | Status: DC | PRN
  Administered 2016-04-01: 15 ug via INTRATHECAL

## 2016-04-01 MED ORDER — EPHEDRINE SULFATE 50 MG/ML IJ SOLN
INTRAMUSCULAR | Status: DC | PRN
  Administered 2016-04-01: 10 mg via INTRAVENOUS

## 2016-04-01 MED ORDER — COCONUT OIL OIL: 1.0000 "application " | TOPICAL_OIL | Status: DC | PRN

## 2016-04-01 MED ORDER — SOD CITRATE-CITRIC ACID 500-334 MG/5ML PO SOLN
30.0000 mL | ORAL | Status: AC
  Administered 2016-04-01: 30 mL via ORAL

## 2016-04-01 MED ORDER — MORPHINE SULFATE (PF) 2 MG/ML IV SOLN: 1.0000 mg | INTRAVENOUS | Status: DC | PRN

## 2016-04-01 MED ORDER — PHENYLEPHRINE 40 MCG/ML (10ML) SYRINGE FOR IV PUSH (FOR BLOOD PRESSURE SUPPORT)
PREFILLED_SYRINGE | INTRAVENOUS | Status: DC | PRN
  Administered 2016-04-01 (×3): 100 ug via INTRAVENOUS

## 2016-04-01 MED ORDER — WITCH HAZEL-GLYCERIN EX PADS: 1.0000 "application " | MEDICATED_PAD | CUTANEOUS | Status: DC | PRN

## 2016-04-01 MED ORDER — BUPIVACAINE HCL (PF) 0.5 % IJ SOLN
INTRAMUSCULAR | Status: AC
  Filled 2016-04-01: qty 30

## 2016-04-01 MED ORDER — LABETALOL HCL 5 MG/ML IV SOLN: 10.0000 mg | INTRAVENOUS | Status: DC | PRN

## 2016-04-01 MED ORDER — CEFOXITIN SODIUM-DEXTROSE 2-2.2 GM-% IV SOLR (PREMIX)
2.0000 g | INTRAVENOUS | Status: AC
  Administered 2016-04-01: 2 g via INTRAVENOUS

## 2016-04-01 MED ORDER — LACTATED RINGERS IV SOLN
INTRAVENOUS | Status: DC
  Administered 2016-04-01: 18:00:00 via INTRAVENOUS
  Administered 2016-04-01: 125 mL/h via INTRAVENOUS
  Administered 2016-04-01: 500 mL via INTRAVENOUS
  Administered 2016-04-01: 125 mL/h via INTRAVENOUS

## 2016-04-01 MED ORDER — NALOXONE HCL 0.4 MG/ML IJ SOLN: 0.4000 mg | INTRAMUSCULAR | Status: DC | PRN

## 2016-04-01 MED ORDER — SOD CITRATE-CITRIC ACID 500-334 MG/5ML PO SOLN
ORAL | Status: AC
  Administered 2016-04-01: 30 mL via ORAL
  Filled 2016-04-01: qty 15

## 2016-04-01 MED ORDER — ACETAMINOPHEN 500 MG PO TABS
1000.0000 mg | ORAL_TABLET | Freq: Four times a day (QID) | ORAL | Status: AC
  Administered 2016-04-02 (×2): 1000 mg via ORAL
  Filled 2016-04-01 (×2): qty 2

## 2016-04-01 SURGICAL SUPPLY — 26 items
CANISTER SUCT 3000ML (MISCELLANEOUS) ×3 IMPLANT
CATH KIT ON-Q SILVERSOAK 5IN (CATHETERS) ×6 IMPLANT
CHLORAPREP W/TINT 26ML (MISCELLANEOUS) ×6 IMPLANT
CLOSURE WOUND 1/2 X4 (GAUZE/BANDAGES/DRESSINGS) ×1
DRSG OPSITE POSTOP 4X12 (GAUZE/BANDAGES/DRESSINGS) ×3 IMPLANT
ELECT CAUTERY BLADE 6.4 (BLADE) ×3 IMPLANT
ELECT REM PT RETURN 9FT ADLT (ELECTROSURGICAL) ×3
ELECTRODE REM PT RTRN 9FT ADLT (ELECTROSURGICAL) ×1 IMPLANT
GLOVE SKINSENSE NS SZ8.0 LF (GLOVE) ×2
GLOVE SKINSENSE STRL SZ8.0 LF (GLOVE) ×1 IMPLANT
GOWN STRL REUS W/ TWL LRG LVL3 (GOWN DISPOSABLE) ×1 IMPLANT
GOWN STRL REUS W/ TWL XL LVL3 (GOWN DISPOSABLE) ×2 IMPLANT
GOWN STRL REUS W/TWL LRG LVL3 (GOWN DISPOSABLE) ×2
GOWN STRL REUS W/TWL XL LVL3 (GOWN DISPOSABLE) ×4
LIQUID BAND (GAUZE/BANDAGES/DRESSINGS) ×3 IMPLANT
NS IRRIG 1000ML POUR BTL (IV SOLUTION) ×3 IMPLANT
PACK C SECTION AR (MISCELLANEOUS) ×3 IMPLANT
PAD OB MATERNITY 4.3X12.25 (PERSONAL CARE ITEMS) ×3 IMPLANT
PAD PREP 24X41 OB/GYN DISP (PERSONAL CARE ITEMS) ×3 IMPLANT
STRIP CLOSURE SKIN 1/2X4 (GAUZE/BANDAGES/DRESSINGS) ×2 IMPLANT
SUCT VACUUM KIWI BELL (SUCTIONS) ×3 IMPLANT
SUT MAXON ABS #0 GS21 30IN (SUTURE) ×6 IMPLANT
SUT PLAIN 2 0 XLH (SUTURE) ×3 IMPLANT
SUT VIC AB 1 CT1 36 (SUTURE) ×9 IMPLANT
SUT VIC AB 2-0 CT1 36 (SUTURE) IMPLANT
SUT VIC AB 4-0 FS2 27 (SUTURE) ×3 IMPLANT

## 2016-04-01 NOTE — Transfer of Care (Signed)
Immediate Anesthesia Transfer of Care Note  Patient: Mallory Pearson  Procedure(s) Performed: Procedure(s) with comments: CESAREAN SECTION (N/A) - Time of birth 18:50 Sex: Female Weight: 7 lbs, 13 oz   Patient Location: PACU  Anesthesia Type:Spinal  Level of Consciousness: awake, alert , oriented and patient cooperative  Airway & Oxygen Therapy: Patient Spontanous Breathing  Post-op Assessment: Report given to RN and Post -op Vital signs reviewed and stable  Post vital signs: Reviewed and stable  Last Vitals:  Vitals:   04/01/16 1815 04/01/16 1942  BP: (!) 137/102 (!) 114/44  Pulse:  (!) 106  Resp:  18  Temp:  36.6 C    Last Pain:  Vitals:   04/01/16 1814  TempSrc: Oral  PainSc:          Complications: No apparent anesthesia complications

## 2016-04-01 NOTE — Anesthesia Procedure Notes (Signed)
Spinal  Patient location during procedure: OR Start time: 04/01/2016 6:33 PM End time: 04/01/2016 6:35 PM Staffing Anesthesiologist: PISCITELLO, JOSEPH K Performed: anesthesiologist  Preanesthetic Checklist Completed: patient identified, site marked, surgical consent, pre-op evaluation, timeout performed, IV checked, risks and benefits discussed and monitors and equipment checked Spinal Block Patient position: sitting Prep: ChloraPrep Patient monitoring: heart rate, continuous pulse ox, blood pressure and cardiac monitor Approach: midline Location: L4-5 Injection technique: single-shot Needle Needle type: Whitacre and Introducer  Needle gauge: 24 G Needle length: 9 cm Assessment Sensory level: T4 Additional Notes Negative paresthesia. Negative blood return. Positive free-flowing CSF. Expiration date of kit checked and confirmed. Patient tolerated procedure well, without complications.       

## 2016-04-01 NOTE — Progress Notes (Signed)
Dr. Kenton Kingfisher at the bedside, D5LR given for low BS (62 mg/dl); 500 mol bolus given.

## 2016-04-01 NOTE — Progress Notes (Addendum)
Pt. Complaining of slight headache, blood sugar checked - 62 mg/dl.  MD notified.  (Dr. Kenton Kingfisher)

## 2016-04-01 NOTE — Progress Notes (Signed)
RN to the bedside, pt. Stable, fetal heart rate in 140's-150s, with accelerations, no decels.  Irregular contractions, 40-90 secs. Lower abd. Clipped in preparation for repeat c-section. SCDs in place. Chlorhexidine wrap performed on abd and upper thighs. Pt. And spouse waiting for surgery.

## 2016-04-01 NOTE — Lactation Note (Signed)
Lactation Consultation Note  Patient Name: Mallory Pearson [id number] Date: 04/01/2016 Reason for consult: Initial assessment   Maternal Data   Breast feeding basics reviewed. Lorenz Coaster Tasheba Henson 04/01/2016, 12:19 PM

## 2016-04-01 NOTE — H&P (Signed)
Obstetrics Admission History & Physical   CC: cHTN, Term Pregnancy, Prior CS    HPI:  30 y.o. G3P1010 @ [redacted]w[redacted]d (04/20/2016, Date entered prior to episode creation). Admitted on 04/01/2016:   Patient Active Problem List   Diagnosis Date Noted  . Gestational diabetes mellitus (GDM) affecting pregnancy 03/15/2016  . DEPRESSION 07/06/2010  . ALLERGIC RHINITIS, SEASONAL 07/06/2010     Presents for worsening BPs and headaches, now >37 weeks, prior CS and h/o cHTN and DM on Insulin.  Prenatal care at: at [facility]  PMHx:  Past Medical History:  Diagnosis Date  . Allergy   . Gestational diabetes   . History of chicken pox   . Hypertension    PSHx:  Past Surgical History:  Procedure Laterality Date  . CESAREAN SECTION  11/26/11  . KNEE SURGERY  2005  . TONSILLECTOMY AND ADENOIDECTOMY     Medications:  Prescriptions Prior to Admission  Medication Sig Dispense Refill Last Dose  . Alcohol Swabs (ALCOHOL PADS) 70 % PADS Prep skin prior to injection 100 each 10 03/31/2016  . aspirin EC 81 MG tablet Take 81 mg by mouth daily.   Past Week  . insulin glargine (LANTUS) 100 UNIT/ML injection Inject 0.36 mLs (36 Units total) into the skin at bedtime. 10 mL 11 03/31/2016  . insulin lispro (HUMALOG) 100 UNIT/ML injection Inject 0.16 mLs (16 Units total) into the skin 3 (three) times daily before meals. 10 mL 11 03/31/2016  . Prenatal Vit-Fe Fumarate-FA (MULTIVITAMIN-PRENATAL) 27-0.8 MG TABS Take 1 tablet by mouth daily. Reported on 10/20/2015   Past Week  . Syringe, Disposable, 1 ML MISC Use for insulin as directed 60 each 10 03/31/2016   Allergies: is allergic to sulfa antibiotics and sulfonamide derivatives. OBHx:  OB History  Gravida Para Term Preterm AB Living  3 1 1   1     SAB TAB Ectopic Multiple Live Births  1            # Outcome Date GA Lbr Len/2nd Weight Sex Delivery Anes PTL Lv  3 Current           2 Term      CS-Unspec     1 SAB              [id number] except as  detailed in HPI. Soc Hx: Pregnancy welcomed  Objective:   Vitals:   04/01/16 1037  BP: 140/90  Pulse: (!) 107  Resp: 20  Temp: 99.1 F (37.3 C)   General: Well nourished, well developed female in no acute distress.  Skin: Warm and dry.  Cardiovascular:Regular rate and rhythm. Respiratory: Clear to auscultation bilateral. Normal respiratory effort Abdomen: mild Neuro/Psych: Normal mood and affect.   Pelvic exam: is not limited by body habitus EGBUS: within normal limits Vagina: within normal limits and with normal mucosa blood in the vault Cervix: not evaluated Uterus: No contractions observed for 30 minutes.  Adnexa: not evaluated  EFM:FHR: 150 bpm, variability: moderate,  accelerations:  Present,  decelerations:  Absent Toco: None   Perinatal info:  Blood type: A positive Rubella- Immune Varicella -Immune TDaP Given during third trimester of this pregnancy RPR NR / HIV Neg/ HBsAg Neg   Assessment & Plan:   30 y.o. G3P1010 @ [redacted]w[redacted]d, Admitted on 04/01/2016:Repeat CS today due to >37 weeks with diagnosis of cHTN with superimposed acute hypertension with headache, diabetes, and prior CS.    CS discussed/ planned.   The risks of cesarean section discussed with the patient  included but were not limited to: bleeding which may require transfusion or reoperation; infection which may require antibiotics; injury to bowel, bladder, ureters or other surrounding organs; injury to the fetus; need for additional procedures including hysterectomy in the event of a life-threatening hemorrhage; placental abnormalities wth subsequent pregnancies, incisional problems, thromboembolic phenomenon and other postoperative/anesthesia complications. The patient concurred with the proposed plan, giving informed written consent for the procedure.

## 2016-04-01 NOTE — Op Note (Signed)
Cesarean Section Procedure Note Indications: Chronic Hypertension with superimposed symptomatic acute hypertension, prior cesarean section and term intrauterine pregnancy  Pre-operative Diagnosis: Intrauterine pregnancy [redacted]w[redacted]d ;  Cnronic HTN with superimposed acute HTN, prior cesarean section and term intrauterine pregnancy Post-operative Diagnosis: same, delivered. Procedure: Low Transverse Cesarean Section Surgeon: Barnett Applebaum, MD, FACOG Anesthesia: Spinal anesthesia Estimated Blood 123456 mL Complications: None; patient tolerated the procedure well. Disposition: PACU - hemodynamically stable. Condition: stable  Findings: A female infant in the cephalic presentation. Amniotic fluid - Clear  Birth weight 7-13 lbs.  Apgars of 7 and 8.  Intact placenta with a three-vessel cord. Grossly normal uterus, tubes and ovaries bilaterally. No intraabdominal adhesions were noted.  Procedure Details   The patient was taken to Operating Room, identified as the correct patient and the procedure verified as C-Section Delivery. A Time Out was held and the above information confirmed. After induction of anesthesia, the patient was draped and prepped in the usual sterile manner. A Pfannenstiel incision was made and carried down through the subcutaneous tissue to the fascia. Fascial incision was made and extended transversely with the Mayo scissors. The fascia was separated from the underlying rectus tissue superiorly and inferiorly. The peritoneum was identified and entered bluntly. Peritoneal incision was extended longitudinally. The utero-vesical peritoneal reflection was incised transversely and a bladder flap was created digitally.  A low transverse hysterotomy was made. The fetus was delivered atraumatically. The umbilical cord was clamped x2 and cut and the infant was handed to the awaiting pediatricians. The placenta was removed intact and appeared normal with a 3-vessel cord.  The uterus was  exteriorized and cleared of all clot and debris. The hysterotomy was closed with running sutures of 0 Vicryl suture. A second imbricating layer was placed with the same suture. Excellent hemostasis was observed. The uterus was returned to the abdomen. The pelvis was irrigated and again, excellent hemostasis was noted.  The On Q Pain pump System was then placed.  Trocars were placed through the abdominal wall into the subfascial space and these were used to thread the silver soaker cathaters into place.The rectus fascia was then reapproximated with running sutures of Maxon, with careful placement not to incorporate the cathaters. Subcutaneous tissues are then irrigated with saline and hemostasis assured.  Skin is then closed with 4-0 vicryl suture in a subcuticular fashion followed by skin adhesive. The cathaters are flushed each with 5 mL of Bupivicaine and stabilized into place with dressing. Instrument, sponge, and needle counts were correct prior to the abdominal closure and at the conclusion of the case.  The patient tolerated the procedure well and was transferred to the recovery room in stable condition.

## 2016-04-01 NOTE — Anesthesia Preprocedure Evaluation (Signed)
Anesthesia Evaluation  Patient identified by MRN, date of birth, ID band Patient awake    Reviewed: Allergy & Precautions, H&P , NPO status , Patient's Chart, lab work & pertinent test results  Airway Mallampati: III  TM Distance: >3 FB Neck ROM: full    Dental  (+) Poor Dentition   Pulmonary neg pulmonary ROS,    Pulmonary exam normal breath sounds clear to auscultation       Cardiovascular Exercise Tolerance: Good hypertension, Normal cardiovascular exam Rhythm:regular Rate:Normal     Neuro/Psych PSYCHIATRIC DISORDERS Depression    GI/Hepatic negative GI ROS,   Endo/Other  diabetes, Gestational, Insulin Dependent  Renal/GU   negative genitourinary   Musculoskeletal   Abdominal   Peds  Hematology negative hematology ROS (+)   Anesthesia Other Findings Past Medical History: No date: Allergy No date: Gestational diabetes No date: History of chicken pox No date: Hypertension  Past Surgical History: 11/26/11: CESAREAN SECTION 2005: KNEE SURGERY No date: TONSILLECTOMY AND ADENOIDECTOMY  BMI    Body Mass Index:  39.38 kg/m      Reproductive/Obstetrics (+) Pregnancy                             Anesthesia Physical Anesthesia Plan  ASA: III  Anesthesia Plan: Spinal   Post-op Pain Management:    Induction:   Airway Management Planned:   Additional Equipment:   Intra-op Plan:   Post-operative Plan:   Informed Consent: I have reviewed the patients History and Physical, chart, labs and discussed the procedure including the risks, benefits and alternatives for the proposed anesthesia with the patient or authorized representative who has indicated his/her understanding and acceptance.     Plan Discussed with: Anesthesiologist  Anesthesia Plan Comments:         Anesthesia Quick Evaluation

## 2016-04-02 ENCOUNTER — Encounter: Payer: Self-pay | Admitting: Obstetrics & Gynecology

## 2016-04-02 LAB — CBC
HEMATOCRIT: 33 % — AB (ref 35.0–47.0)
Hemoglobin: 11.5 g/dL — ABNORMAL LOW (ref 12.0–16.0)
MCH: 29.6 pg (ref 26.0–34.0)
MCHC: 34.7 g/dL (ref 32.0–36.0)
MCV: 85.2 fL (ref 80.0–100.0)
PLATELETS: 152 10*3/uL (ref 150–440)
RBC: 3.88 MIL/uL (ref 3.80–5.20)
RDW: 13.9 % (ref 11.5–14.5)
WBC: 10.5 10*3/uL (ref 3.6–11.0)

## 2016-04-02 LAB — GLUCOSE, CAPILLARY
GLUCOSE-CAPILLARY: 99 mg/dL (ref 65–99)
GLUCOSE-CAPILLARY: 80 mg/dL (ref 65–99)
Glucose-Capillary: 123 mg/dL — ABNORMAL HIGH (ref 65–99)

## 2016-04-02 MED ORDER — IBUPROFEN 600 MG PO TABS
600.0000 mg | ORAL_TABLET | Freq: Four times a day (QID) | ORAL | Status: DC | PRN
  Administered 2016-04-02 – 2016-04-04 (×7): 600 mg via ORAL
  Filled 2016-04-02 (×7): qty 1

## 2016-04-02 NOTE — Discharge Summary (Signed)
Obstetrical Discharge Summary  Date of Admission: 04/01/2016 Date of Discharge: @dischargedt @  Discharge Diagnosis: Term Pregnancy-delivered and chronic hypertension with superimposed acute symptomatic hypertension, Diabetes, Prior Cesraean Primary OB:  [facility]   Gestational Age at Delivery: [redacted]w[redacted]d  Antepartum complications: Diabetes, cHTN Date of Delivery: 04/04/2016  Delivered By: Barnett Applebaum, MD Delivery Type: repeat cesarean section, low transverse incision Intrapartum complications/course: acute symptomatic hypertension Anesthesia: spinal Placenta: manual removal Laceration: n/a Episiotomy: none Live born female  Birth Weight: 7 lb 12.9 oz (3540 g) APGAR: 7, 8   Post partum course: Since the delivery, patient has tolerate activity, diet, and daily functions without difficulty or complication.  Min lochia.  No breast concerns at this time.  No signs of depression currently.   Postpartum Exam:General appearance: alert, cooperative and no distress GI: soft, non-tender; bowel sounds normal; no masses,  no organomegaly and Fundus firm Extremities: extremities normal, atraumatic, no cyanosis or edema Breast: normal in size and symmetry, normal contour with no evidence of flattening or dimpling Inc: C/D/I  Disposition: home with infant Rh Immune globulin given: not applicable Rubella vaccine given: not applicable Varicella vaccine given: not applicable Tdap vaccine given in AP or PP setting: given during prenatal care Flu vaccine given in AP or PP setting: given during prenatal care Contraception: oral contraceptives (estrogen/progesterone), oral progesterone-only contraceptive, and this depends on breast feeding status; to determine at [facility] visit  Prenatal Labs: A POS//Rubella Immune//RPR negative//HIV negative/HepB Surface Ag negative//lactation referral  Plan:  Mallory Pearson was discharged to home in good condition. Follow-up appointment with [facility] provider in 1 week  Discharge  Medications:   Medication List    TAKE these medications   Ibuprofen prn   multivitamin-prenatal 27-0.8 MG Tabs tablet Take 1 tablet by mouth daily. Reported on 10/20/2015   oxyCODONE-acetaminophen 5-325 MG tablet Commonly known as:  PERCOCET/ROXICET Take 2 tablets by mouth every 4 (four) hours as needed (pain scale > 7).   senna-docusate 8.6-50 MG tablet Commonly known as:  Senokot-S Take 2 tablets by mouth at bedtime.       Follow-up arrangements:  1-2 weeks, Dr Kenton Kingfisher   Future Appointments Date Time Provider [place]  04/19/2016 10:45 AM [facility] None

## 2016-04-02 NOTE — Anesthesia Post-op Follow-up Note (Cosign Needed)
  Anesthesia Pain Follow-up Note  Patient: Mallory Pearson  Day #: 1  Date of Follow-up: 04/02/2016 Time: 7:41 AM  Last Vitals:  Vitals:   04/02/16 0201 04/02/16 0339  BP: 127/71 131/74  Pulse: (!) 109 100  Resp: 16 16  Temp: 36.8 C 36.9 C    Level of Consciousness: alert  Pain: mild   Side Effects:None  Catheter Site Exam:clean, dry, no drainage     Plan: Continue current therapy  Buckner [place]

## 2016-04-02 NOTE — Lactation Note (Signed)
This note was copied from a baby's chart. Lactation Consultation Note  Patient Name: Mallory Pearson [id number] Date: 04/02/2016 Reason for consult: Follow-up assessment   Maternal Data  pumped 9cc colostrum, baby took in 8cc by syringe  Feeding Feeding Type: Breast Milk Length of feed: 5 min Baby opening mouth, will latch and nurse a few times and then fall off, still has weak suck, spit small amt of mucous and had larger mec. stool  LATCH Score/Interventions Latch: Repeated attempts needed to sustain latch, nipple held in mouth throughout feeding, stimulation needed to elicit sucking reflex. Intervention(s): Adjust position;Assist with latch;Breast massage;Breast compression  Audible Swallowing: A few with stimulation Intervention(s): Skin to skin;Hand expression Intervention(s):  (supp with syringe 4 cc at breast)  Type of Nipple: Everted at rest and after stimulation  Comfort (Breast/Nipple): Soft / non-tender     Hold (Positioning): Assistance needed to correctly position infant at breast and maintain latch.  LATCH Score: 7  Lactation Tools Discussed/Used Tools: 34F feeding tube / Syringe Breast pump type: Double-Electric Breast Pump   Consult Status Consult Status: Follow-up Date: 04/03/16 Follow-up type: In-patient  Continue attempting latch with assist as needed and pump breasts after and give ebm if poor feeding at breast  Ferol Luz 04/02/2016, 6:02 PM

## 2016-04-02 NOTE — Anesthesia Postprocedure Evaluation (Signed)
Anesthesia Post Note  Patient: Mallory Pearson  Procedure(s) Performed: Procedure(s) (LRB): CESAREAN SECTION (N/A)  Patient location during evaluation: Mother Baby Anesthesia Type: Spinal Level of consciousness: awake, awake and alert and oriented Pain management: pain level controlled Vital Signs Assessment: post-procedure vital signs reviewed and stable Respiratory status: spontaneous breathing Cardiovascular status: blood pressure returned to baseline and stable Postop Assessment: no headache and no backache Anesthetic complications: no Comments: Sitting up, no complaints of pain, alert, oriented, moving feet and legs, foley remains    Last Vitals:  Vitals:   04/02/16 0201 04/02/16 0339  BP: 127/71 131/74  Pulse: (!) 109 100  Resp: 16 16  Temp: 36.8 C 36.9 C    Last Pain:  Vitals:   04/02/16 0339  TempSrc: Oral  PainSc:                  Buckner [place]

## 2016-04-02 NOTE — Lactation Note (Signed)
This note was copied from a baby's chart. Lactation Consultation Note  Patient Name: Mallory Pearson [id number] Date: 04/02/2016 Reason for consult: Follow-up assessment;Difficult latch   Maternal Data Does the patient have breastfeeding experience prior to this delivery?: Yes First time getting baby to latch since delivery, low blood sugars yesterday with formula supp.Able to latch after several attempts, and sucked for 5 min. On right.  Mom pumped after and obtained 4 cc colostrum. Feeding Feeding Type: Breast Milk Nipple Type: Slow - flow Length of feed: 5 min (5 min. right breast, few sucks left ) Baby has tight jaw, sl. High palate with difficulty obtaining latch for any length of time, uncoordinated suck on bottle, finger, supplemented by dad 3 cc formula taken poorly and then given 4 cc expressed breastmilk by syringewith poor suck.  Gagging during feed   LATCH Score/Interventions Latch: Repeated attempts needed to sustain latch, nipple held in mouth throughout feeding, stimulation needed to elicit sucking reflex. Intervention(s): Adjust position;Assist with latch;Breast massage;Breast compression  Audible Swallowing: None Intervention(s): Skin to skin;Hand expression  Type of Nipple: Everted at rest and after stimulation  Comfort (Breast/Nipple): Soft / non-tender     Hold (Positioning): Assistance needed to correctly position infant at breast and maintain latch.  LATCH Score: 6  Lactation Tools Discussed/Used Tools: Pump;3F feeding tube / Syringe Breast pump type: Double-Electric Breast Pump WIC Program: No Pump Review: Setup, frequency, and cleaning;Milk Storage Initiated by:: Chaya Jan RNC IBCLC Date initiated:: 04/02/16   Consult Status Consult Status: Follow-up Date: 04/02/16 Follow-up type: In-patient    Ferol Luz 04/02/2016, 1:52 PM

## 2016-04-02 NOTE — Progress Notes (Signed)
Admit Date: 04/01/2016 Today's Date: 04/02/2016  Subjective: Postpartum Day 1: Cesarean Delivery- cHTN, DM, Repeat. Patient reports incisional pain and tolerating PO.  Doing well.  Objective: Vital signs in last 24 hours: Temp:  [97.7 F (36.5 C)-99.1 F (37.3 C)] 98.2 F (36.8 C) (07/28 0757) Pulse Rate:  [95-116] 101 (07/28 0757) Resp:  [16-22] 18 (07/28 0757) BP: (113-140)/(44-102) 125/80 (07/28 0757) SpO2:  [97 %-100 %] 97 % (07/28 0339) Weight:  [244 lb (110.7 kg)] 244 lb (110.7 kg) (07/27 1037)  Physical Exam:  General: alert, cooperative and no distress Lochia: appropriate Uterine Fundus: firm Incision: healing well DVT Evaluation: No evidence of DVT seen on physical exam.   Recent Labs  04/01/16 1225 04/02/16 0500  HGB 13.5 11.5*  HCT 39.4 33.0*    Assessment/Plan: Status post Cesarean section. Doing well postoperatively.  Continue current care. All BS so far have been 80-100.  No meds. BP all normal since PP.  Monitor. Plans to try breast feeding; lactation today. Plans OCP/minipill.  Hoyt Koch 04/02/2016, 8:18 AM

## 2016-04-03 NOTE — Progress Notes (Signed)
Admit Date: 04/01/2016 Today's Date: 04/03/2016  Subjective: Postpartum Day 2: Cesarean Delivery- cHTN, DM, Repeat. Patient reports min-no incisional pain and tolerating PO.  Doing well.  Objective: Vital signs in last 24 hours: Temp:  [97.8 F (36.6 C)-98.9 F (37.2 C)] 98.3 F (36.8 C) (07/29 0713) Pulse Rate:  [88-121] 98 (07/29 0713) Resp:  [17-20] 17 (07/29 0713) BP: (116-137)/(69-90) 127/87 (07/29 0713) SpO2:  [97 %-99 %] 99 % (07/29 0713)  Physical Exam:  General: alert, cooperative and no distress Lochia: appropriate Uterine Fundus: firm Incision: healing well DVT Evaluation: No evidence of DVT seen on physical exam.   Recent Labs  04/01/16 1225 04/02/16 0500  HGB 13.5 11.5*  HCT 39.4 33.0*    Assessment/Plan: Status post Cesarean section. Doing well postoperatively.  Continue current care. BP all normal since PP.  Monitor. Plans to try breast feeding; lactation today. Infant doing well but needs another day of observation and feeding help. Plans OCP/minipill.  Hoyt Koch 04/03/2016, 9:14 AM

## 2016-04-03 NOTE — Discharge Instructions (Signed)
Cesarean Delivery, Care After °Refer to this sheet in the next few weeks. These instructions provide you with information on caring for yourself after your procedure. Your health care provider may also give you specific instructions. Your treatment has been planned according to current medical practices, but problems sometimes occur. Call your health care provider if you have any problems or questions after you go home. °HOME CARE INSTRUCTIONS  °· Only take over-the-counter or prescription medications as directed by your health care provider. °· Do not drink alcohol, especially if you are breastfeeding or taking medication to relieve pain. °· Do not chew or smoke tobacco. °· Continue to use good perineal care. Good perineal care includes: °¨ Wiping your perineum from front to back. °¨ Keeping your perineum clean. °· Check your surgical cut (incision) daily for increased redness, drainage, swelling, or separation of skin. °· Clean your incision gently with soap and water every day, and then pat it dry. If your health care provider says it is okay, leave the incision uncovered. Use a bandage (dressing) if the incision is draining fluid or appears irritated. If the adhesive strips across the incision do not fall off within 7 days, carefully peel them off. °· Hug a pillow when coughing or sneezing until your incision is healed. This helps to relieve pain. °· Do not use tampons or douche until your health care provider says it is okay. °· Shower, wash your hair, and take tub baths as directed by your health care provider. °· Wear a well-fitting bra that provides breast support. °· Limit wearing support panties or control-top hose. °· Drink enough fluids to keep your urine clear or pale yellow. °· Eat high-fiber foods such as whole grain cereals and breads, brown rice, beans, and fresh fruits and vegetables every day. These foods may help prevent or relieve constipation. °· Resume activities such as climbing stairs,  driving, lifting, exercising, or traveling as directed by your health care provider. °· Talk to your health care provider about resuming sexual activities. This is dependent upon your risk of infection, your rate of healing, and your comfort and desire to resume sexual activity. °· Try to have someone help you with your household activities and your newborn for at least a few days after you leave the hospital. °· Rest as much as possible. Try to rest or take a nap when your newborn is sleeping. °· Increase your activities gradually. °· Keep all of your scheduled postpartum appointments. It is very important to keep your scheduled follow-up appointments. At these appointments, your health care provider will be checking to make sure that you are healing physically and emotionally. °SEEK MEDICAL CARE IF:  °· You are passing large clots from your vagina. Save any clots to show your health care provider. °· You have a foul smelling discharge from your vagina. °· You have trouble urinating. °· You are urinating frequently. °· You have pain when you urinate. °· You have a change in your bowel movements. °· You have increasing redness, pain, or swelling near your incision. °· You have pus draining from your incision. °· Your incision is separating. °· You have painful, hard, or reddened breasts. °· You have a severe headache. °· You have blurred vision or see spots. °· You feel sad or depressed. °· You have thoughts of hurting yourself or your newborn. °· You have questions about your care, the care of your newborn, or medications. °· You are dizzy or light-headed. °· You have a rash. °· You   have pain, redness, or swelling at the site of the removed intravenous access (IV) tube.  You have nausea or vomiting.  You stopped breastfeeding and have not had a menstrual period within 12 weeks of stopping.  You are not breastfeeding and have not had a menstrual period within 12 weeks of delivery.  You have a fever. SEEK  IMMEDIATE MEDICAL CARE IF:  You have persistent pain.  You have chest pain.  You have shortness of breath.  You faint.  You have leg pain.  You have stomach pain.  Your vaginal bleeding saturates 2 or more sanitary pads in 1 hour. MAKE SURE YOU:   Understand these instructions.  Will watch your condition.  Will get help right away if you are not doing well or get worse.   This information is not intended to replace advice given to you by your health care provider. Make sure you discuss any questions you have with your health care provider.   Document Released: 05/15/2002 Document Revised: 09/13/2014 Document Reviewed: 04/19/2012 Elsevier Interactive Patient Education 2016 [employer].  Call your doctor for increased pain or vaginal bleeding, temperature above 100.4, depression, or concerns.  No strenuous activity or heavy lifting for 6 weeks.  No intercourse, tampons, douching, or enemas for 6 weeks.  No tub baths-showers only.  No driving for 2 weeks or while taking pain medications.  Continue prenatal vitamin and iron.  Keep incision clean and dry.  Call your doctor for incision concerns including redness, swelling, bleeding or drainage, or if begins to come apart.  Increase calories and fluids while breastfeeding.

## 2016-04-04 MED ORDER — SENNOSIDES-DOCUSATE SODIUM 8.6-50 MG PO TABS
2.0000 | ORAL_TABLET | Freq: Every day | ORAL | 0 refills | Status: DC
Start: 2016-04-04 — End: 2017-04-11

## 2016-04-04 MED ORDER — OXYCODONE-ACETAMINOPHEN 5-325 MG PO TABS: 2.0000 | ORAL_TABLET | ORAL | 0 refills | Status: DC | PRN

## 2016-04-04 NOTE — Progress Notes (Signed)
Patient discharge to home via wheelchair with spouse and baby in car seat.

## 2016-04-19 ENCOUNTER — Inpatient Hospital Stay: Admission: RE | Admit: 2016-04-19 | Payer: BC Managed Care – PPO | Source: Ambulatory Visit

## 2016-09-06 HISTORY — PX: LARYNX SURGERY: SHX692

## 2016-11-02 DIAGNOSIS — H52213 Irregular astigmatism, bilateral: Secondary | ICD-10-CM | POA: Diagnosis not present

## 2017-01-17 ENCOUNTER — Encounter: Payer: Self-pay | Admitting: Physician Assistant

## 2017-01-17 ENCOUNTER — Ambulatory Visit: Payer: Self-pay | Admitting: Physician Assistant

## 2017-01-17 VITALS — BP 130/80 | HR 95 | Temp 98.0°F

## 2017-01-17 DIAGNOSIS — R110 Nausea: Secondary | ICD-10-CM

## 2017-01-17 DIAGNOSIS — R251 Tremor, unspecified: Secondary | ICD-10-CM

## 2017-01-17 LAB — GLUCOSE, POCT (MANUAL RESULT ENTRY): POC Glucose: 97 mg/dl (ref 70–99)

## 2017-01-17 LAB — POCT URINE PREGNANCY: PREG TEST UR: NEGATIVE

## 2017-01-17 MED ORDER — GLUCOSE BLOOD VI STRP: ORAL_STRIP | 12 refills | Status: DC

## 2017-01-17 NOTE — Progress Notes (Signed)
S: c/o being nauseated, shaky, having tremors for about a week, did have gestational diabetes and is concerned that maybe her glucose is elevated as this is how she felt when she was diagnosed with gestational diabetes, denies fever/chills Is breast feeding  O: vitals wnl, nad, fsbs is wnl, neck supple no lymph, lungs c t a, cv rrr, r hand with intentional tremor  A: tremor, nausea  P: rx for glucose monitoring test strips , f/u with pcp on June 4th for eval, consider hyperthyroidism also

## 2017-02-07 DIAGNOSIS — J301 Allergic rhinitis due to pollen: Secondary | ICD-10-CM | POA: Diagnosis not present

## 2017-02-07 DIAGNOSIS — Z13220 Encounter for screening for lipoid disorders: Secondary | ICD-10-CM | POA: Diagnosis not present

## 2017-02-07 DIAGNOSIS — I471 Supraventricular tachycardia: Secondary | ICD-10-CM | POA: Diagnosis not present

## 2017-02-07 DIAGNOSIS — I10 Essential (primary) hypertension: Secondary | ICD-10-CM | POA: Diagnosis not present

## 2017-02-07 DIAGNOSIS — Z0184 Encounter for antibody response examination: Secondary | ICD-10-CM | POA: Diagnosis not present

## 2017-02-07 DIAGNOSIS — Z0000 Encounter for general adult medical examination without abnormal findings: Secondary | ICD-10-CM | POA: Diagnosis not present

## 2017-02-07 DIAGNOSIS — Z111 Encounter for screening for respiratory tuberculosis: Secondary | ICD-10-CM | POA: Diagnosis not present

## 2017-02-07 DIAGNOSIS — R7301 Impaired fasting glucose: Secondary | ICD-10-CM | POA: Diagnosis not present

## 2017-04-06 ENCOUNTER — Encounter: Payer: Self-pay | Admitting: Obstetrics and Gynecology

## 2017-04-11 ENCOUNTER — Ambulatory Visit (INDEPENDENT_AMBULATORY_CARE_PROVIDER_SITE_OTHER): Payer: 59 | Admitting: Obstetrics and Gynecology

## 2017-04-11 ENCOUNTER — Other Ambulatory Visit: Payer: Self-pay | Admitting: Obstetrics and Gynecology

## 2017-04-11 ENCOUNTER — Encounter: Payer: Self-pay | Admitting: Obstetrics and Gynecology

## 2017-04-11 VITALS — BP 130/80 | Ht 67.0 in | Wt 222.0 lb

## 2017-04-11 DIAGNOSIS — O09299 Supervision of pregnancy with other poor reproductive or obstetric history, unspecified trimester: Secondary | ICD-10-CM | POA: Diagnosis not present

## 2017-04-11 DIAGNOSIS — O09899 Supervision of other high risk pregnancies, unspecified trimester: Secondary | ICD-10-CM | POA: Diagnosis not present

## 2017-04-11 DIAGNOSIS — O0990 Supervision of high risk pregnancy, unspecified, unspecified trimester: Secondary | ICD-10-CM

## 2017-04-11 DIAGNOSIS — Z3A09 9 weeks gestation of pregnancy: Secondary | ICD-10-CM

## 2017-04-11 DIAGNOSIS — Z124 Encounter for screening for malignant neoplasm of cervix: Secondary | ICD-10-CM | POA: Diagnosis not present

## 2017-04-11 DIAGNOSIS — Z8632 Personal history of gestational diabetes: Secondary | ICD-10-CM

## 2017-04-11 DIAGNOSIS — O99210 Obesity complicating pregnancy, unspecified trimester: Secondary | ICD-10-CM

## 2017-04-11 DIAGNOSIS — O34219 Maternal care for unspecified type scar from previous cesarean delivery: Secondary | ICD-10-CM | POA: Diagnosis not present

## 2017-04-11 NOTE — Progress Notes (Signed)
New Obstetric Patient H&P    Chief Complaint: "Desires prenatal care"   History of Present Illness: Patient is a 31 y.o. [id number] Not Hispanic or Latino female, LMP 02/06/17 presents with amenorrhea and positive home pregnancy test. Based on her  LMP, her EDD is Estimated Date of Delivery: 11/13/17 and her EGA is [redacted]w[redacted]d. Cycles are regular monthly, lasting 5 days.    She had a urine pregnancy test which was positive 2 weeks ago. Her last menstrual period was normal and lasted for  5 day(s). Since her LMP she claims she has experienced breast tenderness, and fatigue. She denies vaginal bleeding. Her past medical history is noncontributory. Her prior pregnancies are notable for pre-eclampsia, gestational DM  Since her LMP, she admits to the use of tobacco products  no She claims she has gained   no pounds since the start of her pregnancy.  There are cats in the home in the home  no If yes N/A She admits close contact with children on a regular basis  yes  She has had chicken pox in the past no She has had Tuberculosis exposures, symptoms, or previously tested positive for TB   no Current or past history of domestic violence. no  Genetic Screening/Teratology Counseling: (Includes patient, baby's father, or anyone in either family with:)   77. Patient's age >/= 63 at [facility]  no 2. Thalassemia ([place], [place], [place], or Asian background): MCV<80  no 3. Neural tube defect (meningomyelocele, spina bifida, anencephaly)  no 4. Congenital heart defect  no  5. Down syndrome  no 6. Tay-Sachs (Jewish, [place])  no 7. Canavan's Disease  no 8. Sickle cell disease or trait (African)  no  9. Hemophilia or other blood disorders  no  10. Muscular dystrophy  no  11. Cystic fibrosis  no  12. Huntington's Chorea  no  13. Mental retardation/autism  no 14. Other inherited genetic or chromosomal disorder  no 15. Maternal metabolic disorder (DM, PKU, etc)  no 16. Patient or FOB with a  child with a birth defect not listed above no  16a. Patient or FOB with a birth defect themselves no 17. Recurrent pregnancy loss, or stillbirth  no  18. Any medications since LMP other than prenatal vitamins (include vitamins, supplements, OTC meds, drugs, alcohol)  no 19. Any other genetic/environmental exposure to discuss  no  Infection History:   1. Lives with someone with TB or TB exposed  no  2. Patient or partner has history of genital herpes  no 3. Rash or viral illness since LMP  no 4. History of STI (GC, CT, HPV, syphilis, HIV)  no 5. History of recent travel :  no  Other pertinent information:  no     Review of Systems:10 point review of systems negative unless otherwise noted in HPI  Past Medical History:  Past Medical History:  Diagnosis Date  . Allergy   . Gestational diabetes   . History of chicken pox   . Hypertension     Past Surgical History:  Past Surgical History:  Procedure Laterality Date  . CESAREAN SECTION  11/26/11  . CESAREAN SECTION N/A 04/01/2016   Procedure: CESAREAN SECTION;  Surgeon: Gae Dry, MD;  Location: [facility] ORS;  Service: Obstetrics;  Laterality: N/A;  Time of birth 18:50 Sex: Female Weight: 7 lbs, 13 oz   . KNEE SURGERY  2005  . TONSILLECTOMY AND ADENOIDECTOMY      Gynecologic History: Patient's last menstrual  period was 02/06/2017 (exact date).  Obstetric History: [id number]  Family History:  No family history on file.  Social History:  Social History   Social History  . Marital status: Married    Spouse name: N/A  . Number of children: N/A  . Years of education: N/A   Occupational History  . cna [employer]   Social History Main Topics  . Smoking status: Never Smoker  . Smokeless tobacco: Never Used  . Alcohol use No  . Drug use: No  . Sexual activity: Not on file   Other Topics Concern  . Not on file   Social History Narrative   Regular exercise--yes    Allergies:  Allergies  Allergen Reactions  .  Sulfa Antibiotics Hives  . Sulfonamide Derivatives Hives    Medications: Prior to Admission medications   Medication Sig Start Date End Date Taking? Authorizing Provider  Prenatal Vit-Fe Fumarate-FA (MULTIVITAMIN-PRENATAL) 27-0.8 MG TABS Take 1 tablet by mouth daily. Reported on 10/20/2015   Yes [provider]    Physical Exam Vitals: Blood pressure 130/80, height 5\' 7"  (1.702 m), weight 222 lb (100.7 kg), last menstrual period 02/06/2017, unknown if currently breastfeeding.  General: NAD HEENT: normocephalic, anicteric Thyroid: no enlargement, no palpable nodules Pulmonary: No increased work of breathing, CTAB Cardiovascular: RRR, distal pulses 2+ Abdomen: NABS, soft, non-tender, non-distended.  Umbilicus without lesions.  No hepatomegaly, splenomegaly or masses palpable. No evidence of hernia  Genitourinary:  External: Normal external female genitalia.  Normal urethral meatus, normal  Bartholin's and Skene's glands.    Vagina: Normal vaginal mucosa, no evidence of prolapse.    Cervix: Grossly normal in appearance, no bleeding  Uterus:  Non-enlarged, mobile, normal contour.  No CMT  Adnexa: ovaries non-enlarged, no adnexal masses  Rectal: deferred Extremities: no edema, erythema, or tenderness Neurologic: Grossly intact Psychiatric: mood appropriate, affect full   Assessment: 31 y.o. G4P2011 at [redacted]w[redacted]d presenting to initiate prenatal care  Plan: 1) Avoid alcoholic beverages. 2) Patient encouraged not to smoke.  3) Discontinue the use of all non-medicinal drugs and chemicals.  4) Take prenatal vitamins daily.  5) Nutrition, food safety (fish, cheese advisories, and high nitrite foods) and exercise discussed. 6) Hospital and practice style discussed with cross coverage system.  7) Genetic Screening, such as with 1st Trimester Screening, cell free fetal DNA, AFP testing, and Ultrasound, as well as with amniocentesis and CVS as appropriate, is discussed with patient. At the  conclusion of today's visit patient requested genetic testing 8) Patient is asked about travel to areas at risk for the Zika virus, and counseled to avoid travel and exposure to mosquitoes or sexual partners who may have themselves been exposed to the virus. Testing is discussed, and will be ordered as appropriate.

## 2017-04-11 NOTE — Patient Instructions (Signed)
Start low dose ASA at >[redacted] weeks gestation as per USPTF recommendation "Low-Dose Aspirin Use for the Prevention of Morbidity and Mortality From Preeclampsia: Preventive Medicine"  furthermore endorsed by ACOG, WHO, and NIH based on evidence level B for the prevention of preeclampsia  In women deemed high risk  (diabetes, renal disease, chronic hypertension, history of preeclampsia in prior gestation, autoimmune diseases, or multifetal gestations)  First Trimester of Pregnancy The first trimester of pregnancy is from week 1 until the end of week 13 (months 1 through 3). A week after a sperm fertilizes an egg, the egg will implant on the wall of the uterus. This embryo will begin to develop into a baby. Genes from you and your partner will form the baby. The female genes will determine whether the baby will be a boy or a girl. At 6-8 weeks, the eyes and face will be formed, and the heartbeat can be seen on ultrasound. At the end of 12 weeks, all the baby's organs will be formed. Now that you are pregnant, you will want to do everything you can to have a healthy baby. Two of the most important things are to get good prenatal care and to follow your health care provider's instructions. Prenatal care is all the medical care you receive before the baby's birth. This care will help prevent, find, and treat any problems during the pregnancy and childbirth. Body changes during your first trimester Your body goes through many changes during pregnancy. The changes vary from woman to woman.  You may gain or lose a couple of pounds at first.  You may feel sick to your stomach (nauseous) and you may throw up (vomit). If the vomiting is uncontrollable, call your health care provider.  You may tire easily.  You may develop headaches that can be relieved by medicines. All medicines should be approved by your health care provider.  You may urinate more often. Painful urination may mean you have a bladder  infection.  You may develop heartburn as a result of your pregnancy.  You may develop constipation because certain hormones are causing the muscles that push stool through your intestines to slow down.  You may develop hemorrhoids or swollen veins (varicose veins).  Your breasts may begin to grow larger and become tender. Your nipples may stick out more, and the tissue that surrounds them (areola) may become darker.  Your gums may bleed and may be sensitive to brushing and flossing.  Dark spots or blotches (chloasma, mask of pregnancy) may develop on your face. This will likely fade after the baby is born.  Your menstrual periods will stop.  You may have a loss of appetite.  You may develop cravings for certain kinds of food.  You may have changes in your emotions from day to day, such as being excited to be pregnant or being concerned that something may go wrong with the pregnancy and baby.  You may have more vivid and strange dreams.  You may have changes in your hair. These can include thickening of your hair, rapid growth, and changes in texture. Some women also have hair loss during or after pregnancy, or hair that feels dry or thin. Your hair will most likely return to normal after your baby is born.  What to expect at prenatal visits During a routine prenatal visit:  You will be weighed to make sure you and the baby are growing normally.  Your blood pressure will be taken.  Your abdomen will be  measured to track your baby's growth.  The fetal heartbeat will be listened to between weeks 10 and 14 of your pregnancy.  Test results from any previous visits will be discussed.  Your health care provider may ask you:  How you are feeling.  If you are feeling the baby move.  If you have had any abnormal symptoms, such as leaking fluid, bleeding, severe headaches, or abdominal cramping.  If you are using any tobacco products, including cigarettes, chewing tobacco, and  electronic cigarettes.  If you have any questions.  Other tests that may be performed during your first trimester include:  Blood tests to find your blood type and to check for the presence of any previous infections. The tests will also be used to check for low iron levels (anemia) and protein on red blood cells (Rh antibodies). Depending on your risk factors, or if you previously had diabetes during pregnancy, you may have tests to check for high blood sugar that affects pregnant women (gestational diabetes).  Urine tests to check for infections, diabetes, or protein in the urine.  An ultrasound to confirm the proper growth and development of the baby.  Fetal screens for spinal cord problems (spina bifida) and Down syndrome.  HIV (human immunodeficiency virus) testing. Routine prenatal testing includes screening for HIV, unless you choose not to have this test.  You may need other tests to make sure you and the baby are doing well.  Follow these instructions at home: Medicines  Follow your health care provider's instructions regarding medicine use. Specific medicines may be either safe or unsafe to take during pregnancy.  Take a prenatal vitamin that contains at least 600 micrograms (mcg) of folic acid.  If you develop constipation, try taking a stool softener if your health care provider approves. Eating and drinking  Eat a balanced diet that includes fresh fruits and vegetables, whole grains, good sources of protein such as meat, eggs, or tofu, and low-fat dairy. Your health care provider will help you determine the amount of weight gain that is right for you.  Avoid raw meat and uncooked cheese. These carry germs that can cause birth defects in the baby.  Eating four or five small meals rather than three large meals a day may help relieve nausea and vomiting. If you start to feel nauseous, eating a few soda crackers can be helpful. Drinking liquids between meals, instead of  during meals, also seems to help ease nausea and vomiting.  Limit foods that are high in fat and processed sugars, such as fried and sweet foods.  To prevent constipation: ? Eat foods that are high in fiber, such as fresh fruits and vegetables, whole grains, and beans. ? Drink enough fluid to keep your urine clear or pale yellow. Activity  Exercise only as directed by your health care provider. Most women can continue their usual exercise routine during pregnancy. Try to exercise for 30 minutes at least 5 days a week. Exercising will help you: ? Control your weight. ? Stay in shape. ? Be prepared for labor and delivery.  Experiencing pain or cramping in the lower abdomen or lower back is a good sign that you should stop exercising. Check with your health care provider before continuing with normal exercises.  Try to avoid standing for long periods of time. Move your legs often if you must stand in one place for a long time.  Avoid heavy lifting.  Wear low-heeled shoes and practice good posture.  You may continue  to have sex unless your health care provider tells you not to. Relieving pain and discomfort  Wear a good support bra to relieve breast tenderness.  Take warm sitz baths to soothe any pain or discomfort caused by hemorrhoids. Use hemorrhoid cream if your health care provider approves.  Rest with your legs elevated if you have leg cramps or low back pain.  If you develop varicose veins in your legs, wear support hose. Elevate your feet for 15 minutes, 3-4 times a day. Limit salt in your diet. Prenatal care  Schedule your prenatal visits by the twelfth week of pregnancy. They are usually scheduled monthly at first, then more often in the last 2 months before delivery.  Write down your questions. Take them to your prenatal visits.  Keep all your prenatal visits as told by your health care provider. This is important. Safety  Wear your seat belt at all times when  driving.  Make a list of emergency phone numbers, including numbers for family, friends, the hospital, and police and fire departments. General instructions  Ask your health care provider for a referral to a local prenatal education class. Begin classes no later than the beginning of month 6 of your pregnancy.  Ask for help if you have counseling or nutritional needs during pregnancy. Your health care provider can offer advice or refer you to specialists for help with various needs.  Do not use hot tubs, steam rooms, or saunas.  Do not douche or use tampons or scented sanitary pads.  Do not cross your legs for long periods of time.  Avoid cat litter boxes and soil used by cats. These carry germs that can cause birth defects in the baby and possibly loss of the fetus by miscarriage or stillbirth.  Avoid all smoking, herbs, alcohol, and medicines not prescribed by your health care provider. Chemicals in these products affect the formation and growth of the baby.  Do not use any products that contain nicotine or tobacco, such as cigarettes and e-cigarettes. If you need help quitting, ask your health care provider. You may receive counseling support and other resources to help you quit.  Schedule a dentist appointment. At home, brush your teeth with a soft toothbrush and be gentle when you floss. Contact a health care provider if:  You have dizziness.  You have mild pelvic cramps, pelvic pressure, or nagging pain in the abdominal area.  You have persistent nausea, vomiting, or diarrhea.  You have a bad smelling vaginal discharge.  You have pain when you urinate.  You notice increased swelling in your face, hands, legs, or ankles.  You are exposed to fifth disease or chickenpox.  You are exposed to [place] measles (rubella) and have never had it. Get help right away if:  You have a fever.  You are leaking fluid from your vagina.  You have spotting or bleeding from your  vagina.  You have severe abdominal cramping or pain.  You have rapid weight gain or loss.  You vomit blood or material that looks like coffee grounds.  You develop a severe headache.  You have shortness of breath.  You have any kind of trauma, such as from a fall or a car accident. Summary  The first trimester of pregnancy is from week 1 until the end of week 13 (months 1 through 3).  Your body goes through many changes during pregnancy. The changes vary from woman to woman.  You will have routine prenatal visits. During those visits,  your health care provider will examine you, discuss any test results you may have, and talk with you about how you are feeling. This information is not intended to replace advice given to you by your health care provider. Make sure you discuss any questions you have with your health care provider. Document Released: 08/17/2001 Document Revised: 08/04/2016 Document Reviewed: 08/04/2016 Elsevier Interactive Patient Education  2017 [employer].

## 2017-04-11 NOTE — Progress Notes (Signed)
NOB 

## 2017-04-12 DIAGNOSIS — O0990 Supervision of high risk pregnancy, unspecified, unspecified trimester: Secondary | ICD-10-CM | POA: Insufficient documentation

## 2017-04-12 DIAGNOSIS — O99210 Obesity complicating pregnancy, unspecified trimester: Secondary | ICD-10-CM | POA: Insufficient documentation

## 2017-04-12 LAB — PROTEIN / CREATININE RATIO, URINE
Creatinine, Urine: 169.9 mg/dL
Protein, Ur: 12.4 mg/dL
Protein/Creat Ratio: 73 mg/g creat (ref 0–200)

## 2017-04-13 LAB — PAPIG, CTNGTV, HPV, RFX 16/18
Chlamydia, Nuc. Acid Amp: NEGATIVE
Gonococcus, Nuc. Acid Amp: NEGATIVE
HPV, high-risk: NEGATIVE
PAP SMEAR COMMENT: 0
Trich vag by NAA: NEGATIVE

## 2017-04-13 LAB — COMPREHENSIVE METABOLIC PANEL
A/G RATIO: 2 (ref 1.2–2.2)
ALT: 6 IU/L (ref 0–32)
AST: 8 IU/L (ref 0–40)
Albumin: 4.7 g/dL (ref 3.5–5.5)
Alkaline Phosphatase: 43 IU/L (ref 39–117)
BUN/Creatinine Ratio: 19 (ref 9–23)
BUN: 12 mg/dL (ref 6–20)
Bilirubin Total: 0.4 mg/dL (ref 0.0–1.2)
CO2: 20 mmol/L (ref 20–29)
Calcium: 10.1 mg/dL (ref 8.7–10.2)
Chloride: 99 mmol/L (ref 96–106)
Creatinine, Ser: 0.64 mg/dL (ref 0.57–1.00)
GFR, EST AFRICAN AMERICAN: 138 mL/min/{1.73_m2} (ref >59)
GFR, EST NON AFRICAN AMERICAN: 119 mL/min/{1.73_m2} (ref >59)
GLOBULIN, TOTAL: 2.3 g/dL (ref 1.5–4.5)
GLUCOSE: 91 mg/dL (ref 65–99)
POTASSIUM: 4 mmol/L (ref 3.5–5.2)
SODIUM: 139 mmol/L (ref 134–144)
Total Protein: 7 g/dL (ref 6.0–8.5)

## 2017-04-13 LAB — RPR+RH+ABO+RUB AB+AB SCR+CB...
ANTIBODY SCREEN: NEGATIVE
HIV SCREEN 4TH GENERATION: NONREACTIVE
Hematocrit: 42.2 % (ref 34.0–46.6)
Hemoglobin: 14.4 g/dL (ref 11.1–15.9)
Hepatitis B Surface Ag: NEGATIVE
MCH: 29 pg (ref 26.6–33.0)
MCHC: 34.1 g/dL (ref 31.5–35.7)
MCV: 85 fL (ref 79–97)
PLATELETS: 272 10*3/uL (ref 150–379)
RBC: 4.96 x10E6/uL (ref 3.77–5.28)
RDW: 13.2 % (ref 12.3–15.4)
RPR: NONREACTIVE
Rh Factor: POSITIVE
Rubella Antibodies, IGG: 11.5 index (ref >0.99)
Varicella zoster IgG: 1185 index (ref >165)
WBC: 13.3 10*3/uL — AB (ref 3.4–10.8)

## 2017-04-13 LAB — URINE CULTURE

## 2017-04-19 ENCOUNTER — Ambulatory Visit (INDEPENDENT_AMBULATORY_CARE_PROVIDER_SITE_OTHER): Payer: 59

## 2017-04-19 ENCOUNTER — Other Ambulatory Visit: Payer: 59

## 2017-04-19 ENCOUNTER — Ambulatory Visit (INDEPENDENT_AMBULATORY_CARE_PROVIDER_SITE_OTHER): Payer: 59 | Admitting: Obstetrics & Gynecology

## 2017-04-19 VITALS — BP 120/80 | Wt 223.0 lb

## 2017-04-19 DIAGNOSIS — O34219 Maternal care for unspecified type scar from previous cesarean delivery: Secondary | ICD-10-CM | POA: Diagnosis not present

## 2017-04-19 DIAGNOSIS — Z3A09 9 weeks gestation of pregnancy: Secondary | ICD-10-CM

## 2017-04-19 DIAGNOSIS — Z3A10 10 weeks gestation of pregnancy: Secondary | ICD-10-CM

## 2017-04-19 DIAGNOSIS — O09299 Supervision of pregnancy with other poor reproductive or obstetric history, unspecified trimester: Secondary | ICD-10-CM

## 2017-04-19 DIAGNOSIS — Z8632 Personal history of gestational diabetes: Secondary | ICD-10-CM

## 2017-04-19 DIAGNOSIS — O0990 Supervision of high risk pregnancy, unspecified, unspecified trimester: Secondary | ICD-10-CM

## 2017-04-19 DIAGNOSIS — O09899 Supervision of other high risk pregnancies, unspecified trimester: Secondary | ICD-10-CM | POA: Diagnosis not present

## 2017-04-19 DIAGNOSIS — O99210 Obesity complicating pregnancy, unspecified trimester: Secondary | ICD-10-CM

## 2017-04-19 NOTE — Progress Notes (Signed)
Review of ULTRASOUND.    I have personally reviewed images and report of recent ultrasound done at [facility].    Plan of management to be discussed with patient.  PNV. Glucola today (obesity, BMI 34). First screen nv (desires genetic testing). Barnett Applebaum, MD, [facility], [facility] 04/19/2017  11:12 AM

## 2017-04-20 LAB — GLUCOSE, 1 HOUR GESTATIONAL: GESTATIONAL DIABETES SCREEN: 127 mg/dL (ref 65–139)

## 2017-04-25 ENCOUNTER — Encounter: Payer: Self-pay | Admitting: Obstetrics and Gynecology

## 2017-05-04 ENCOUNTER — Ambulatory Visit (INDEPENDENT_AMBULATORY_CARE_PROVIDER_SITE_OTHER): Payer: 59 | Admitting: Obstetrics & Gynecology

## 2017-05-04 ENCOUNTER — Ambulatory Visit (INDEPENDENT_AMBULATORY_CARE_PROVIDER_SITE_OTHER): Payer: 59

## 2017-05-04 VITALS — BP 130/80 | Wt 230.0 lb

## 2017-05-04 DIAGNOSIS — O0990 Supervision of high risk pregnancy, unspecified, unspecified trimester: Secondary | ICD-10-CM | POA: Diagnosis not present

## 2017-05-04 DIAGNOSIS — Z3A12 12 weeks gestation of pregnancy: Secondary | ICD-10-CM

## 2017-05-04 DIAGNOSIS — Z362 Encounter for other antenatal screening follow-up: Secondary | ICD-10-CM | POA: Diagnosis not present

## 2017-05-04 DIAGNOSIS — Z8632 Personal history of gestational diabetes: Secondary | ICD-10-CM

## 2017-05-04 DIAGNOSIS — O09299 Supervision of pregnancy with other poor reproductive or obstetric history, unspecified trimester: Secondary | ICD-10-CM

## 2017-05-04 DIAGNOSIS — O09899 Supervision of other high risk pregnancies, unspecified trimester: Secondary | ICD-10-CM

## 2017-05-04 DIAGNOSIS — O34219 Maternal care for unspecified type scar from previous cesarean delivery: Secondary | ICD-10-CM

## 2017-05-04 NOTE — Patient Instructions (Signed)

## 2017-05-04 NOTE — Progress Notes (Signed)
PNV.  [place] anat screen sch for 20 weeks today so husband can plan to be here. Review of ULTRASOUND. First Screen done.  Labs today for that.    I have personally reviewed images and report of recent ultrasound done at [facility].    Plan of management to be discussed with patient.

## 2017-05-06 DIAGNOSIS — D2339 Other benign neoplasm of skin of other parts of face: Secondary | ICD-10-CM | POA: Diagnosis not present

## 2017-05-06 DIAGNOSIS — L218 Other seborrheic dermatitis: Secondary | ICD-10-CM | POA: Diagnosis not present

## 2017-05-06 DIAGNOSIS — Q828 Other specified congenital malformations of skin: Secondary | ICD-10-CM | POA: Diagnosis not present

## 2017-05-07 LAB — FIRST TRIMESTER SCREEN W/NT
CRL: 66.4 mm
DIA MOM: 0.41
DIA VALUE: 73 pg/mL
GEST AGE-COLLECT: 12.9 wk
MATERNAL AGE AT EDD: 32 a
NUMBER OF FETUSES: 1
Nuchal Translucency MoM: 0.85
Nuchal Translucency: 1.4 mm
PAPP-A MoM: 0.98
PAPP-A Value: 618.2 ng/mL
Test Results:: NEGATIVE
Weight: 230 [lb_av]
hCG MoM: 0.79
hCG Value: 51.1 IU/mL

## 2017-05-29 ENCOUNTER — Emergency Department
Admission: EM | Admit: 2017-05-29 | Discharge: 2017-05-29 | Disposition: A | Discharge status: Home / Self Care | Payer: 59 | Attending: Emergency Medicine | Admitting: Emergency Medicine

## 2017-05-29 ENCOUNTER — Encounter: Payer: Self-pay | Admitting: Emergency Medicine

## 2017-05-29 ENCOUNTER — Emergency Department: Payer: 59

## 2017-05-29 DIAGNOSIS — O99512 Diseases of the respiratory system complicating pregnancy, second trimester: Secondary | ICD-10-CM | POA: Diagnosis not present

## 2017-05-29 DIAGNOSIS — O10912 Unspecified pre-existing hypertension complicating pregnancy, second trimester: Secondary | ICD-10-CM | POA: Diagnosis not present

## 2017-05-29 DIAGNOSIS — Z3A16 16 weeks gestation of pregnancy: Secondary | ICD-10-CM | POA: Insufficient documentation

## 2017-05-29 DIAGNOSIS — J4540 Moderate persistent asthma, uncomplicated: Secondary | ICD-10-CM | POA: Insufficient documentation

## 2017-05-29 DIAGNOSIS — R0602 Shortness of breath: Secondary | ICD-10-CM | POA: Diagnosis not present

## 2017-05-29 LAB — CBC
HCT: 39.2 % (ref 35.0–47.0)
Hemoglobin: 13.6 g/dL (ref 12.0–16.0)
MCH: 28.6 pg (ref 26.0–34.0)
MCHC: 34.7 g/dL (ref 32.0–36.0)
MCV: 82.6 fL (ref 80.0–100.0)
PLATELETS: 209 10*3/uL (ref 150–440)
RBC: 4.75 MIL/uL (ref 3.80–5.20)
RDW: 12.9 % (ref 11.5–14.5)
WBC: 13.7 10*3/uL — ABNORMAL HIGH (ref 3.6–11.0)

## 2017-05-29 LAB — BASIC METABOLIC PANEL
Anion gap: 11 (ref 5–15)
BUN: 11 mg/dL (ref 6–20)
CALCIUM: 10 mg/dL (ref 8.9–10.3)
CO2: 22 mmol/L (ref 22–32)
CREATININE: 0.51 mg/dL (ref 0.44–1.00)
Chloride: 105 mmol/L (ref 101–111)
GFR calc Af Amer: >60 mL/min (ref >60)
GFR calc non Af Amer: >60 mL/min (ref >60)
Glucose, Bld: 113 mg/dL — ABNORMAL HIGH (ref 65–99)
Potassium: 3.4 mmol/L — ABNORMAL LOW (ref 3.5–5.1)
Sodium: 138 mmol/L (ref 135–145)

## 2017-05-29 LAB — TROPONIN I

## 2017-05-29 MED ORDER — PREDNISONE 10 MG PO TABS: 50.0000 mg | ORAL_TABLET | Freq: Every day | ORAL | 0 refills | Status: AC

## 2017-05-29 MED ORDER — IPRATROPIUM-ALBUTEROL 0.5-2.5 (3) MG/3ML IN SOLN
3.0000 mL | Freq: Once | RESPIRATORY_TRACT | Status: AC
  Administered 2017-05-29: 3 mL via RESPIRATORY_TRACT
  Filled 2017-05-29: qty 3

## 2017-05-29 MED ORDER — ALBUTEROL SULFATE (2.5 MG/3ML) 0.083% IN NEBU: 2.5000 mg | INHALATION_SOLUTION | Freq: Four times a day (QID) | RESPIRATORY_TRACT | 12 refills | Status: DC | PRN

## 2017-05-29 MED ORDER — SPACER/AERO CHAMBER MOUTHPIECE MISC: 1.0000 [IU] | 0 refills | Status: DC | PRN

## 2017-05-29 MED ORDER — SODIUM CHLORIDE 0.9 % IV BOLUS (SEPSIS)
1000.0000 mL | Freq: Once | INTRAVENOUS | Status: AC
  Administered 2017-05-29: 1000 mL via INTRAVENOUS

## 2017-05-29 MED ORDER — ALBUTEROL SULFATE HFA 108 (90 BASE) MCG/ACT IN AERS: 2.0000 | INHALATION_SPRAY | Freq: Four times a day (QID) | RESPIRATORY_TRACT | 2 refills | Status: DC | PRN

## 2017-05-29 MED ORDER — PREDNISONE 20 MG PO TABS
60.0000 mg | ORAL_TABLET | Freq: Once | ORAL | Status: AC
  Administered 2017-05-29: 60 mg via ORAL
  Filled 2017-05-29: qty 3

## 2017-05-29 NOTE — ED Triage Notes (Signed)
Patient presents to ED via POV from pcp office with c/o SOB. Patient is [redacted] weeks pregnant. Patient states the SOB has gotten progressively worse. Patient with labored breathing noted. Denies CP.

## 2017-05-29 NOTE — ED Notes (Signed)
Pt c/o increasing SHOB x several weeks. Pt states has progressed to [facility] with speaking sentences, pt noted to have some difficulty with speaking in complete sentences while changing into a gown. Pt is alert and oriented, noted to have a dry cough. Pt is also noted to be tachycardic with a HR of 116-130. Pt is neurologically intact, skin is warm, dry, and intact.

## 2017-05-29 NOTE — ED Provider Notes (Signed)
[facility] Emergency Department Provider Note  ____________________________________________   First MD Initiated Contact with Patient 05/29/17 1500     (approximate)  I have reviewed the triage vital signs and the nursing notes.   HISTORY  Chief Complaint Shortness of Breath    HPI Mallory Pearson is a 31 y.o. female who self presents to the emergency department with 3-4 days of shortness of breath. Her shortness of breath is moderate severity. It is not worse with exertion. She has no leg swelling. No hemoptysis. No fevers or chills. No rhinorrhea or other upper respiratory symptoms. The patient is roughly [redacted] weeks pregnant with her third pregnancy. She never had shortness of breath with other pregnancies. She's never had a deep vein thrombosis or pulmonary embolism. She has no history of asthma or COPD. She has mild severity diffuse aching upper chest pain worse with coughing improved when not coughing. The pain is not ripping or tearing and does not go straight to her back. Past Medical History:  Diagnosis Date  . Allergy   . Gestational diabetes   . History of chicken pox   . Hypertension     Patient Active Problem List   Diagnosis Date Noted  . Maternal obesity, antepartum 04/12/2017  . High-risk pregnancy supervision, unspecified trimester 04/12/2017  . Hx of preeclampsia, prior pregnancy, currently pregnant, unspecified trimester 04/11/2017  . History of cesarean section complicating pregnancy 09/60/4540  . History of gestational diabetes in prior pregnancy, currently pregnant 04/11/2017  . Short interval between pregnancies affecting pregnancy, antepartum 04/11/2017  . Benign essential hypertension antepartum 04/01/2016  . DEPRESSION 07/06/2010  . ALLERGIC RHINITIS, SEASONAL 07/06/2010    Past Surgical History:  Procedure Laterality Date  . CESAREAN SECTION  11/26/11  . CESAREAN SECTION N/A 04/01/2016   Procedure: CESAREAN SECTION;   Surgeon: Gae Dry, MD;  Location: [facility] ORS;  Service: Obstetrics;  Laterality: N/A;  Time of birth 18:50 Sex: Female Weight: 7 lbs, 13 oz   . KNEE SURGERY  2005  . TONSILLECTOMY AND ADENOIDECTOMY      Prior to Admission medications   Medication Sig Start Date End Date Taking? Authorizing Provider  Prenatal Vit-Fe Fumarate-FA (MULTIVITAMIN-PRENATAL) 27-0.8 MG TABS Take 1 tablet by mouth daily. Reported on 10/20/2015   Yes [provider]  albuterol (PROVENTIL HFA;VENTOLIN HFA) 108 (90 Base) MCG/ACT inhaler Inhale 2 puffs into the lungs every 6 (six) hours as needed for wheezing or shortness of breath. 05/29/17   Darel Hong, MD  albuterol (PROVENTIL) (2.5 MG/3ML) 0.083% nebulizer solution Take 3 mLs (2.5 mg total) by nebulization every 6 (six) hours as needed for wheezing or shortness of breath. 05/29/17   Darel Hong, MD  predniSONE (DELTASONE) 10 MG tablet Take 5 tablets (50 mg total) by mouth daily. 05/29/17 06/02/17  Darel Hong, MD  Spacer/Aero Chamber Mouthpiece MISC 1 Units by Does not apply route every 4 (four) hours as needed (wheezing). 05/29/17   Darel Hong, MD    Allergies Sulfa antibiotics and Sulfonamide derivatives  No family history on file.  Social History Social History  Substance Use Topics  . Smoking status: Never Smoker  . Smokeless tobacco: Never Used  . Alcohol use No    Review of Systems Constitutional: No fever/chills Eyes: No visual changes. ENT: No sore throat. Cardiovascular: Positive chest pain. Respiratory: Positive shortness of breath. Gastrointestinal: No abdominal pain.  No nausea, no vomiting.  No diarrhea.  No constipation. Genitourinary: Negative for dysuria. Musculoskeletal: Negative for back  pain. Skin: Negative for rash. Neurological: Negative for headaches, focal weakness or numbness.   ____________________________________________   PHYSICAL EXAM:  VITAL SIGNS: ED Triage Vitals  Enc Vitals Group     BP  05/29/17 1410 (!) 144/87     Pulse Rate 05/29/17 1410 (!) 132     Resp 05/29/17 1410 (!) 30     Temp 05/29/17 1410 98.4 F (36.9 C)     Temp Source 05/29/17 1410 Oral     SpO2 05/29/17 1410 98 %     Weight 05/29/17 1406 230 lb (104.3 kg)     Height 05/29/17 1406 5\' 6"  (1.676 m)     Head Circumference --      Peak Flow --      Pain Score 05/29/17 1406 0     Pain Loc --      Pain Edu? --      Excl. in [place]? --     Constitutional: Alert and oriented 4 smiling joking and laughing well-appearing no respiratory distress whatsoever Eyes: PERRL EOMI. Head: Atraumatic. Nose: No congestion/rhinnorhea. Mouth/Throat: No trismus Neck: No stridor.   Cardiovascular: Tachycardic rate, regular rhythm. Grossly normal heart sounds.  Good peripheral circulation. Respiratory: Mildly increased respiratory effort with equal lung sounds bilaterally and moving good air. She does have expiratory wheezes throughout all fields Gastrointestinal: Soft nontender Musculoskeletal: No lower extremity edema  legs are equal in size Neurologic:  Normal speech and language. No gross focal neurologic deficits are appreciated. Skin:  Skin is warm, dry and intact. No rash noted. Psychiatric: Mood and affect are normal. Speech and behavior are normal.    ____________________________________________   DIFFERENTIAL includes but not limited to  Reactive airway disease, asthma, COPD, pneumonia, pulmonary embolism, pneumothorax ____________________________________________   LABS (all labs ordered are listed, but only abnormal results are displayed)  Labs Reviewed  BASIC METABOLIC PANEL - Abnormal; Notable for the following:       Result Value   Potassium 3.4 (*)    Glucose, Bld 113 (*)    All other components within normal limits  CBC - Abnormal; Notable for the following:    WBC 13.7 (*)    All other components within normal limits  TROPONIN I    Blood work reviewed and interpreted by me shows an elevated  white count is nonspecific __________________________________________  EKG  ED ECG REPORT I, Darel Hong, the attending physician, personally viewed and interpreted this ECG.  Date: 05/29/2017 EKG Time:  Rate: 127 Rhythm: sinus tachycardia QRS Axis: normal Intervals: normal ST/T Wave abnormalities: normal Narrative Interpretation: no evidence of acute ischemia  ____________________________________________  RADIOLOGY  Chest x-ray reviewed by me shows no acute disease ____________________________________________   PROCEDURES  Procedure(s) performed: no  Procedures  Critical Care performed: no  Observation: no ____________________________________________   INITIAL IMPRESSION / ASSESSMENT AND PLAN / ED COURSE  Pertinent labs & imaging results that were available during my care of the patient were reviewed by me and considered in my medical decision making (see chart for details).  The patient arrives tachycardic with a somewhat increased respiratory rate although comfortable joking laughing and very well-appearing. She is saturating 99% on room air. Her lungs do sound wheezy bilaterally. She has no history of asthma. Differentials of course broad and includes pneumonia as well as reactive airway disease and pulmonary embolism. Pulmonary embolism however is typically with more silent lungs. I'll give her a trial of albuterol now and reevaluate. She is clinically dehydrated as well likely from  elevated respiratory rate for several days so she got a liter of fluid in the meantime.  After the first breathing treatment the patient feels remarkably improved. Her lung sounds are also more clear. I will give her another breathing treatment and treat her with a short course of steroids for presumptive reactive airway disease. I do not believe she had a pulmonary embolism. She has a follow-up appointment with her OB gynecologist in 4 days which I think is reasonable. Strict return  precautions have been given and she is discharged home in improved condition.      ____________________________________________   FINAL CLINICAL IMPRESSION(S) / ED DIAGNOSES  Final diagnoses:  Moderate persistent reactive airway disease without complication      NEW MEDICATIONS STARTED DURING THIS VISIT:  Discharge Medication List as of 05/29/2017  5:26 PM    START taking these medications   Details  albuterol (PROVENTIL HFA;VENTOLIN HFA) 108 (90 Base) MCG/ACT inhaler Inhale 2 puffs into the lungs every 6 (six) hours as needed for wheezing or shortness of breath., Starting Sun 05/29/2017, Print    albuterol (PROVENTIL) (2.5 MG/3ML) 0.083% nebulizer solution Take 3 mLs (2.5 mg total) by nebulization every 6 (six) hours as needed for wheezing or shortness of breath., Starting Sun 05/29/2017, Print    predniSONE (DELTASONE) 10 MG tablet Take 5 tablets (50 mg total) by mouth daily., Starting Sun 05/29/2017, Until Thu 06/02/2017, Print    Spacer/Aero Chamber Mouthpiece MISC 1 Units by Does not apply route every 4 (four) hours as needed (wheezing)., Starting Sun 05/29/2017, Print         Note:  This document was prepared using Dragon voice recognition software and may include unintentional dictation errors.     Darel Hong, MD 05/30/17 1409

## 2017-05-29 NOTE — Discharge Instructions (Signed)
Please take all of your steroids as prescribed and use your inhaler as needed for severe symptoms. Follow-up with your OB gynecologist this coming Wednesday as scheduled and return to the emergency department sooner for any concerns.  It was a pleasure to take care of you today, and thank you for coming to our emergency department.  If you have any questions or concerns before leaving please ask the nurse to grab me and I'm more than happy to go through your aftercare instructions again.  If you were prescribed any opioid pain medication today such as Norco, Vicodin, Percocet, morphine, hydrocodone, or oxycodone please make sure you do not drive when you are taking this medication as it can alter your ability to drive safely.  If you have any concerns once you are home that you are not improving or are in fact getting worse before you can make it to your follow-up appointment, please do not hesitate to call 911 and come back for further evaluation.  Darel Hong, MD  Results for orders placed or performed during the hospital encounter of 48/27/07  Basic metabolic panel  Result Value Ref Range   Sodium 138 135 - 145 mmol/L   Potassium 3.4 (L) 3.5 - 5.1 mmol/L   Chloride 105 101 - 111 mmol/L   CO2 22 22 - 32 mmol/L   Glucose, Bld 113 (H) 65 - 99 mg/dL   BUN 11 6 - 20 mg/dL   Creatinine, Ser 0.51 0.44 - 1.00 mg/dL   Calcium 10.0 8.9 - 10.3 mg/dL   GFR calc non Af Amer >60 >60 mL/min   GFR calc Af Amer >60 >60 mL/min   Anion gap 11 5 - 15  CBC  Result Value Ref Range   WBC 13.7 (H) 3.6 - 11.0 K/uL   RBC 4.75 3.80 - 5.20 MIL/uL   Hemoglobin 13.6 12.0 - 16.0 g/dL   HCT 39.2 35.0 - 47.0 %   MCV 82.6 80.0 - 100.0 fL   MCH 28.6 26.0 - 34.0 pg   MCHC 34.7 32.0 - 36.0 g/dL   RDW 12.9 11.5 - 14.5 %   Platelets 209 150 - 440 K/uL  Troponin I  Result Value Ref Range   Troponin I <0.03 <0.03 ng/mL   Dg Chest 1 View  Result Date: 05/29/2017 CLINICAL DATA:  Shortness of breath 2-3 weeks  with worsening chest pain 2 days. EXAM: CHEST 1 VIEW COMPARISON:  None. FINDINGS: Lungs are hypoinflated without consolidation or effusion. Cardiomediastinal silhouette, bones and soft tissues are normal. IMPRESSION: Hypoinflation without acute cardiopulmonary disease. Electronically Signed   By: Marin Olp M.D.   On: 05/29/2017 15:29

## 2017-06-01 ENCOUNTER — Ambulatory Visit (INDEPENDENT_AMBULATORY_CARE_PROVIDER_SITE_OTHER): Payer: 59 | Admitting: Obstetrics and Gynecology

## 2017-06-01 VITALS — BP 146/80 | HR 122 | Wt 234.0 lb

## 2017-06-01 DIAGNOSIS — J4540 Moderate persistent asthma, uncomplicated: Secondary | ICD-10-CM

## 2017-06-01 DIAGNOSIS — Z3A16 16 weeks gestation of pregnancy: Secondary | ICD-10-CM

## 2017-06-01 DIAGNOSIS — O09899 Supervision of other high risk pregnancies, unspecified trimester: Secondary | ICD-10-CM

## 2017-06-01 DIAGNOSIS — O09299 Supervision of pregnancy with other poor reproductive or obstetric history, unspecified trimester: Secondary | ICD-10-CM

## 2017-06-01 DIAGNOSIS — O0990 Supervision of high risk pregnancy, unspecified, unspecified trimester: Secondary | ICD-10-CM

## 2017-06-01 DIAGNOSIS — O99210 Obesity complicating pregnancy, unspecified trimester: Secondary | ICD-10-CM

## 2017-06-01 DIAGNOSIS — O34219 Maternal care for unspecified type scar from previous cesarean delivery: Secondary | ICD-10-CM

## 2017-06-01 DIAGNOSIS — Z8632 Personal history of gestational diabetes: Secondary | ICD-10-CM

## 2017-06-01 NOTE — Progress Notes (Signed)
Routine Prenatal Care Visit  Subjective  Mallory Pearson is a 31 y.o. G4P2011 at [redacted]w[redacted]d being seen today for ongoing prenatal care.  She is currently monitored for the following issues for this high-risk pregnancy and has DEPRESSION; ALLERGIC RHINITIS, SEASONAL; Benign essential hypertension antepartum; Hx of preeclampsia, prior pregnancy, currently pregnant, unspecified trimester; History of cesarean section complicating pregnancy; History of gestational diabetes in prior pregnancy, currently pregnant; Short interval between pregnancies affecting pregnancy, antepartum; Maternal obesity, antepartum; and High-risk pregnancy supervision, unspecified trimester on her problem list.  ----------------------------------------------------------------------------------- Patient reports shortness of breath.  Evaluated in ED over the weekend and diagnosed with reactive airway disease.  Has noted some improvement with albuterole but continues to have symptoms and audible wheezing.  No drooling, does feel symptoms are worse laying down.  Denies CP, no swelling.  Symptoms a little more prominent laying down.  No history of asthma, no recent viral illnesses, or exposures that are temporally related.  . Vag. Bleeding: None.   . Denies leaking of fluid.  ----------------------------------------------------------------------------------- The following portions of the patient's history were reviewed and updated as appropriate: allergies, current medications, past family history, past medical history, past social history, past surgical history and problem list. Problem list updated.   Objective  Blood pressure (!) 146/80, pulse (!) 122, weight 234 lb (106.1 kg), last menstrual period 02/06/2017, unknown if currently breastfeeding. Pregravid weight 222 lb (100.7 kg) Total Weight Gain 12 lb (5.443 kg) Urinalysis:      Fetal Status: Fetal Heart Rate (bpm): 150         General:  Alert, oriented and cooperative.  Patient is in no acute distress.  Skin: Skin is warm and dry. No rash noted.   Cardiovascular: Normal heart rate noted  Respiratory: Audible stridor, both inspiratory and expiratory stridor, able to talk in complete sentences, no accessory muscle use  Abdomen: Soft, gravid, appropriate for gestational age. Pain/Pressure: Absent     Pelvic:  Cervical exam deferred        Extremities: Normal range of motion.     ental Status: Normal mood and affect. Normal behavior. Normal judgment and thought content.     Assessment   31 y.o. Y3K1601 at [redacted]w[redacted]d by  11/13/2017, by Last Menstrual Period presenting for routine prenatal visit  Plan   pregnancy#4 Problems (from 02/06/17 to present)    Problem Noted Resolved   High-risk pregnancy supervision, unspecified trimester 04/12/2017 by Malachy Mood, MD No   Priority:  High     Overview Addendum 04/25/2017  1:24 PM by Malachy Mood, MD    [facility] Prenatal Labs  Dating  Blood type: A positive  Genetic Screen 1 Screen:    AFP:     Quad:     NIPS: Antibody:Negative  Anatomic [place]  Rubella: Immune Varicella: Immune  GTT Early:129    Third trimester:  RPR: NR  Rhogam  HBsAg: Negative  TDaP vaccine                       Flu Shot: HIV: Negative  Baby Food                                GBS:   Contraception  Pap: NIL HPV negative 04/11/17  CBB   Baseline P/C ratio 169mg /dL  CS/VBAC    Support Person  Maternal obesity, antepartum 04/12/2017 by Malachy Mood, MD No   Overview Signed 04/12/2017  8:53 PM by Malachy Mood, MD    BMI >=40 [ ]  early 1h gtt -  [ ]  u/s for dating [ ]   [ ]  nutritional goals [ ]  folic acid 1mg  [ ]  bASA (>12 weeks) [ ]  consider nutrition consult [ ]  consider maternal EKG 1st trimester [ ]  Growth u/s 28 [ ] , 32 [ ] , 36 weeks [ ]  [ ]  NST/AFI weekly 36+ weeks (36[] , 37[] , 38[] , 39[] , 40[] ) [ ]  IOL by 41 weeks (scheduled, prn [] )       Hx of preeclampsia, prior pregnancy, currently pregnant,  unspecified trimester 04/11/2017 by Malachy Mood, MD No   Overview Addendum 04/14/2017  1:06 PM by Malachy Mood, MD    [X]  Baseline P/C ratio 169mg /dL [ ]  Aspirin 81 mg daily after 12 weeks; discontinue after 36 weeks   Lab Results  Component Value Date   PLT 272 04/11/2017   CREATININE 0.64 04/11/2017   AST 8 04/11/2017   ALT 6 04/11/2017          History of cesarean section complicating pregnancy 05/11/3201 by Malachy Mood, MD No   Overview Signed 04/12/2017  8:52 PM by Malachy Mood, MD    Desires repeat      History of gestational diabetes in prior pregnancy, currently pregnant 04/11/2017 by Malachy Mood, MD No   Overview Signed 04/12/2017  8:52 PM by Malachy Mood, MD    HgbA1C at [facility], early 1-hr ordered      Short interval between pregnancies affecting pregnancy, antepartum 04/11/2017 by Malachy Mood, MD No   DEPRESSION 07/06/2010 by Owens Loffler, MD No   Overview Signed 10/14/2010  6:18 PM by Interface, Problem List In    Qualifier: Diagnosis of  By: Copland MD, Spencer            Preterm labor symptoms and general obstetric precautions including but not limited to vaginal bleeding, contractions, leaking of fluid and fetal movement were reviewed in detail with the patient. Please refer to After Visit Summary for other counseling recommendations.  - Currently on steroid course and albuterol - Audible wheezing both expiratory and inspiratory, really sounds more like stridor than wheezing - CXR clear, no evidence of enlargement of cardiac silhouette  - Referral to ENT to evaluate upper airway Return in about 1 week (around 06/08/2017) for [place].

## 2017-06-01 NOTE — Progress Notes (Signed)
ROB

## 2017-06-02 ENCOUNTER — Emergency Department: Payer: 59

## 2017-06-02 ENCOUNTER — Encounter: Payer: Self-pay | Admitting: Obstetrics and Gynecology

## 2017-06-02 ENCOUNTER — Encounter: Payer: Self-pay | Admitting: Emergency Medicine

## 2017-06-02 ENCOUNTER — Emergency Department
Admission: EM | Admit: 2017-06-02 | Discharge: 2017-06-02 | Disposition: A | Discharge status: Home / Self Care | Payer: 59 | Attending: Emergency Medicine | Admitting: Emergency Medicine

## 2017-06-02 DIAGNOSIS — R0600 Dyspnea, unspecified: Secondary | ICD-10-CM | POA: Insufficient documentation

## 2017-06-02 DIAGNOSIS — I10 Essential (primary) hypertension: Secondary | ICD-10-CM | POA: Diagnosis not present

## 2017-06-02 DIAGNOSIS — R0602 Shortness of breath: Secondary | ICD-10-CM | POA: Diagnosis not present

## 2017-06-02 DIAGNOSIS — E010 Iodine-deficiency related diffuse (endemic) goiter: Secondary | ICD-10-CM | POA: Diagnosis not present

## 2017-06-02 LAB — CBC WITH DIFFERENTIAL/PLATELET
BASOS ABS: 0.1 10*3/uL (ref 0–0.1)
Basophils Relative: 0 %
EOS PCT: 0 %
Eosinophils Absolute: 0 10*3/uL (ref 0–0.7)
HEMATOCRIT: 39.2 % (ref 35.0–47.0)
Hemoglobin: 13.6 g/dL (ref 12.0–16.0)
LYMPHS ABS: 1.4 10*3/uL (ref 1.0–3.6)
LYMPHS PCT: 9 %
MCH: 28.8 pg (ref 26.0–34.0)
MCHC: 34.6 g/dL (ref 32.0–36.0)
MCV: 83.3 fL (ref 80.0–100.0)
MONO ABS: 0.5 10*3/uL (ref 0.2–0.9)
Monocytes Relative: 4 %
NEUTROS ABS: 13.2 10*3/uL — AB (ref 1.4–6.5)
Neutrophils Relative %: 87 %
PLATELETS: 214 10*3/uL (ref 150–440)
RBC: 4.7 MIL/uL (ref 3.80–5.20)
RDW: 13.1 % (ref 11.5–14.5)
WBC: 15.2 10*3/uL — ABNORMAL HIGH (ref 3.6–11.0)

## 2017-06-02 LAB — BASIC METABOLIC PANEL
Anion gap: 11 (ref 5–15)
BUN: 10 mg/dL (ref 6–20)
CALCIUM: 9.2 mg/dL (ref 8.9–10.3)
CHLORIDE: 103 mmol/L (ref 101–111)
CO2: 22 mmol/L (ref 22–32)
CREATININE: 0.57 mg/dL (ref 0.44–1.00)
GFR calc Af Amer: >60 mL/min (ref >60)
Glucose, Bld: 132 mg/dL — ABNORMAL HIGH (ref 65–99)
Potassium: 3.5 mmol/L (ref 3.5–5.1)
Sodium: 136 mmol/L (ref 135–145)

## 2017-06-02 LAB — TROPONIN I

## 2017-06-02 LAB — BRAIN NATRIURETIC PEPTIDE: B NATRIURETIC PEPTIDE 5: 26 pg/mL (ref 0.0–100.0)

## 2017-06-02 LAB — TSH: TSH: 0.315 u[IU]/mL — ABNORMAL LOW (ref 0.350–4.500)

## 2017-06-02 LAB — FIBRIN DERIVATIVES D-DIMER (ARMC ONLY): Fibrin derivatives D-dimer (ARMC): 211.22 ng/mL (FEU) (ref 0.00–499.00)

## 2017-06-02 MED ORDER — IPRATROPIUM-ALBUTEROL 0.5-2.5 (3) MG/3ML IN SOLN
RESPIRATORY_TRACT | Status: AC
  Administered 2017-06-02: 3 mL via RESPIRATORY_TRACT
  Filled 2017-06-02: qty 3

## 2017-06-02 MED ORDER — IPRATROPIUM-ALBUTEROL 0.5-2.5 (3) MG/3ML IN SOLN
3.0000 mL | Freq: Once | RESPIRATORY_TRACT | Status: AC
Start: 2017-06-02 — End: 2017-06-02
  Administered 2017-06-02: 3 mL via RESPIRATORY_TRACT

## 2017-06-02 MED ORDER — ALBUTEROL SULFATE (2.5 MG/3ML) 0.083% IN NEBU
5.0000 mg | INHALATION_SOLUTION | Freq: Once | RESPIRATORY_TRACT | Status: AC
  Administered 2017-06-02: 5 mg via RESPIRATORY_TRACT
  Filled 2017-06-02: qty 6

## 2017-06-02 MED ORDER — OMEPRAZOLE 20 MG PO CPDR: 40.0000 mg | DELAYED_RELEASE_CAPSULE | Freq: Two times a day (BID) | ORAL | 1 refills | Status: DC

## 2017-06-02 MED ORDER — IOPAMIDOL (ISOVUE-370) INJECTION 76%
75.0000 mL | Freq: Once | INTRAVENOUS | Status: AC | PRN
  Administered 2017-06-02: 75 mL via INTRAVENOUS

## 2017-06-02 MED ORDER — SODIUM CHLORIDE 0.9 % IV BOLUS (SEPSIS)
1000.0000 mL | Freq: Once | INTRAVENOUS | Status: AC
  Administered 2017-06-02: 1000 mL via INTRAVENOUS

## 2017-06-02 MED ORDER — IPRATROPIUM BROMIDE 0.02 % IN SOLN
0.5000 mg | Freq: Once | RESPIRATORY_TRACT | Status: AC
  Administered 2017-06-02: 0.5 mg via RESPIRATORY_TRACT
  Filled 2017-06-02: qty 2.5

## 2017-06-02 MED ORDER — LIDOCAINE HCL 2 % EX GEL
CUTANEOUS | Status: AC
  Filled 2017-06-02: qty 10

## 2017-06-02 NOTE — Consult Note (Signed)
Mallory Pearson, Mallory Pearson [id number] 1985/12/05 [name], MD  Reason for Consult: stridor, shortness of breath  HPI: 31 y.o. Female with 2 week history of progressive shortness of breath and tachycardia.  Treated at home with albuterol nebulizers with minimal improvement.  Feels better today but yesterday at [facility] was in distress especially while supine with audible inspiratory and expiratory stridor per OB.  Asked to evaluate for airway exam and concern for subglottic stenosis of pregnancy.  Patient does report some reflux type symptoms.  No prior autoimmune disease.  This is patient's third pregnancy and no similar issues prior.    Allergies:  Allergies  Allergen Reactions  . Sulfa Antibiotics Hives  . Sulfonamide Derivatives Hives    ROS: Review of systems normal other than 12 systems except per HPI.  PMH:  Past Medical History:  Diagnosis Date  . Allergy   . Gestational diabetes   . History of chicken pox   . Hypertension     FH: No family history on file.  SH:  Social History   Social History  . Marital status: Married    Spouse name: N/A  . Number of children: N/A  . Years of education: N/A   Occupational History  . cna [employer]   Social History Main Topics  . Smoking status: Never Smoker  . Smokeless tobacco: Never Used  . Alcohol use No  . Drug use: No  . Sexual activity: Not on file   Other Topics Concern  . Not on file   Social History Narrative   Regular exercise--yes    PSH:  Past Surgical History:  Procedure Laterality Date  . CESAREAN SECTION  11/26/11  . CESAREAN SECTION N/A 04/01/2016   Procedure: CESAREAN SECTION;  Surgeon: Gae Dry, MD;  Location: [facility] ORS;  Service: Obstetrics;  Laterality: N/A;  Time of birth 18:50 Sex: Female Weight: 7 lbs, 13 oz   . KNEE SURGERY  2005  . TONSILLECTOMY AND ADENOIDECTOMY      Physical  Exam:  GEN-  CN 2-12 grossly intact and symmetric. EARS- external ears clear NOSE-  Mild left sided spur,  mild turbinate hypertrophy OC/OP-  Clear with no masses or lesions NECK-  Short neck with mild fullness of thyroid but incomplete evaluation due to clavicle blocking inferior aspect of thyroid for palpation   Procedure:  Trans-nasal flexible laryngoscopy-  After verbal consent was obtained, the patient's left nasal cavity was sprayed with 2% viscous lidocaine.  No vasoconstrictor was used due to concern for pregnancy.  The flexible laryngoscopy was inserted to the patient's nasal cavity and the nasopharynx, pharynx, larynx and hypopharynx evaluated.  This demonstrated post-cricoid edema and erythema.  True vocal folds were mobile bilaterally.  There was erythema and exudate on the anterior tracheal wall.  Unclear if any significant subglottic stenosis was present due to visualization.   A/P: Shortness of breath-  Not a clear subglottic evaluation on scope although there was some erythema and exudate present on anterior wall.  Recommend CT scan of neck with contrast to evaluate subglottic airway.  Discussed with OB who agreed with examination.  This will also evaluate her thyroid as well to see if any large substernal goiter/nodules that are present.  Agree with TSH given thyroid fullness and tachycardia.  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  Unclear how significant this is currently with her symptoms but could be a large part.  Recommend Omeprazole 40mg  PO qday as well if cleared by OB.  Will follow CT results.  If subglottic stenosis present and significant, will most likely need evaluation/care at tertiary center.   Velora Horstman 06/02/2017 5:59 PM

## 2017-06-02 NOTE — ED Notes (Signed)
ENT at bedside

## 2017-06-02 NOTE — ED Triage Notes (Addendum)
Pt reports seen here Sunday and diagnosed with reactive airway disease uncomplicated. Pt reports shortness of breath continues. Pt reports attempted to make appt with ENT but they would not see her until breathing is better, pt then contacted Dr. Star Age who told her to come to the ED. Pt speaking in complete sentences without difficulty. Dyspnea at rest and tachypnea noted in triage. Denies pain. Pt is [redacted] weeks pregnant. Per Dr. Mariea Clonts, no labs at this time, duoneb ordered.

## 2017-06-02 NOTE — ED Notes (Signed)
Pt waiting on ENT to arrive

## 2017-06-02 NOTE — Discharge Instructions (Signed)
it is not clear exactly why you have the shortness of breath;  we strongly advise you take antacids and that you elevate the bed and avoid spicy food is acid ingestion can cause this. If you have increasing shortness of breath chest pain or other concerns please return immediately to the emergency department. Follow up with Dr. Pryor Ochoa as well as Dr. Star Age closely in the outpatient setting for further care.

## 2017-06-02 NOTE — ED Provider Notes (Addendum)
[facility]  ____________________________________________   I have reviewed the triage vital signs and the nursing notes.   HISTORY  Chief Complaint Shortness of Breath    HPI Mallory Pearson is a 31 y.o. female He presents today complaining of shortness of breath. Patient has been short of breath for 2 weeks. She does not have a history of asthma, she does have some degree of seasonal allergies. She denies any fever or chills. She has cough and wheeze. Cough is nonproductive. She has been seen by the emergency department and her primary care OB/GYN for this. Patient states she has such shortness of breath she has difficulty getting around the house. She has been wheezing. There is no productive cough. She denies any significant seasonal allergies that are likely to cause this. No history of COPD. No personal or family history of blood clots. No lower extremity swelling. The steroids that she took did not seem to help to any significant degree although maybe there was some alleviating symptoms. They are gone. he does not get much relief from breathing treatments either.no calf pain or leg swelling. She did have hypertension in her last pregnancy towards the end. She is not having any nausea or vomiting she denies pain     Past Medical History:  Diagnosis Date  . Allergy   . Gestational diabetes   . History of chicken pox   . Hypertension     Patient Active Problem List   Diagnosis Date Noted  . Maternal obesity, antepartum 04/12/2017  . High-risk pregnancy supervision, unspecified trimester 04/12/2017  . Hx of preeclampsia, prior pregnancy, currently pregnant, unspecified trimester 04/11/2017  . History of cesarean section complicating pregnancy 69/48/5462  . History of gestational diabetes in prior pregnancy, currently pregnant 04/11/2017  . Short interval between pregnancies affecting pregnancy, antepartum  04/11/2017  . Benign essential hypertension antepartum 04/01/2016  . DEPRESSION 07/06/2010  . ALLERGIC RHINITIS, SEASONAL 07/06/2010    Past Surgical History:  Procedure Laterality Date  . CESAREAN SECTION  11/26/11  . CESAREAN SECTION N/A 04/01/2016   Procedure: CESAREAN SECTION;  Surgeon: Gae Dry, MD;  Location: [facility] ORS;  Service: Obstetrics;  Laterality: N/A;  Time of birth 18:50 Sex: Female Weight: 7 lbs, 13 oz   . KNEE SURGERY  2005  . TONSILLECTOMY AND ADENOIDECTOMY      Prior to Admission medications   Medication Sig Start Date End Date Taking? Authorizing Provider  albuterol (PROVENTIL HFA;VENTOLIN HFA) 108 (90 Base) MCG/ACT inhaler Inhale 2 puffs into the lungs every 6 (six) hours as needed for wheezing or shortness of breath. 05/29/17   Darel Hong, MD  albuterol (PROVENTIL) (2.5 MG/3ML) 0.083% nebulizer solution Take 3 mLs (2.5 mg total) by nebulization every 6 (six) hours as needed for wheezing or shortness of breath. 05/29/17   Darel Hong, MD  predniSONE (DELTASONE) 10 MG tablet Take 5 tablets (50 mg total) by mouth daily. 05/29/17 06/02/17  Darel Hong, MD  Prenatal Vit-Fe Fumarate-FA (MULTIVITAMIN-PRENATAL) 27-0.8 MG TABS Take 1 tablet by mouth daily. Reported on 10/20/2015    [provider]  Spacer/Aero Chamber Mouthpiece MISC 1 Units by Does not apply route every 4 (four) hours as needed (wheezing). 05/29/17   Darel Hong, MD    Allergies Sulfa antibiotics and Sulfonamide derivatives  No family history on file.  Social History Social History  Substance Use Topics  . Smoking status: Never Smoker  . Smokeless tobacco: Never Used  . Alcohol  use No    Review of Systems Constitutional: No fever/chills Eyes: No visual changes. ENT: No sore throat. No stiff neck no neck pain Cardiovascular: Denies chest pain. Respiratory: positive shortness of breath. Gastrointestinal:   no vomiting.  No diarrhea.  No constipation. Genitourinary:  Negative for dysuria. Musculoskeletal: Negative lower extremity swelling Skin: Negative for rash. Neurological: Negative for severe headaches, focal weakness or numbness.   ____________________________________________   PHYSICAL EXAM:  VITAL SIGNS: ED Triage Vitals  Enc Vitals Group     BP 06/02/17 1144 (!) 129/101     Pulse Rate 06/02/17 1144 (!) 125     Resp 06/02/17 1144 (!) 22     Temp 06/02/17 1144 98.2 F (36.8 C)     Temp Source 06/02/17 1144 Oral     SpO2 06/02/17 1144 99 %     Weight 06/02/17 1146 234 lb (106.1 kg)     Height 06/02/17 1146 5\' 6"  (1.676 m)     Head Circumference --      Peak Flow --      Pain Score 06/02/17 1146 0     Pain Loc --      Pain Edu? --      Excl. in [place]? --     Constitutional: Alert and oriented. Well appearing and in no acute distress. Eyes: Conjunctivae are normal Head: Atraumatic HEENT: No congestion/rhinnorhea. Mucous membranes are moist.  Oropharynx non-erythematous Neck:   Nontender with no meningismus, no masses, no stridor Cardiovascular: tachycardia notedregular rhythm. Grossly normal heart sounds.  Good peripheral circulation. Respiratory: patient has shortness of breath moving around the room, there is Diffuse coarse breath sounds with occasional wheeze noted, she is moving air, she sounds somewhat tight however. No rales no rhonchi Abdominal: Soft and nontender. No distention. No guarding no rebound Back:  There is no focal tenderness or step off.  there is no midline tenderness there are no lesions noted. there is no CVA tenderness Musculoskeletal: No lower extremity tenderness, no upper extremity tenderness. No joint effusions, no DVT signs strong distal pulses no edema Neurologic:  Normal speech and language. No gross focal neurologic deficits are appreciated.  Skin:  Skin is warm, dry and intact. No rash noted. Psychiatric: Mood and affect are normal. Speech and behavior are  normal.  ____________________________________________   LABS (all labs ordered are listed, but only abnormal results are displayed)  Labs Reviewed  CBC WITH DIFFERENTIAL/PLATELET - Abnormal; Notable for the following:       Result Value   WBC 15.2 (*)    Neutro Abs 13.2 (*)    All other components within normal limits  BASIC METABOLIC PANEL  BRAIN NATRIURETIC PEPTIDE  TROPONIN I    Pertinent labs  results that were available during my care of the patient were reviewed by me and considered in my medical decision making (see chart for details). ____________________________________________  EKG  I personally interpreted any EKGs ordered by me or triage sinus tach rate 110 normal sinus rhythm with no acute ST elevation or depression normal axis ____________________________________________  RADIOLOGY  Pertinent labs & imaging results that were available during my care of the patient were reviewed by me and considered in my medical decision making (see chart for details). If possible, patient and/or family made aware of any abnormal findings. ____________________________________________    PROCEDURES  Procedure(s) performed: None  Procedures  Critical Care performed: None  ____________________________________________   INITIAL IMPRESSION / ASSESSMENT AND PLAN / ED COURSE  Pertinent labs &  imaging results that were available during my care of the patient were reviewed by me and considered in my medical decision making (see chart for details).  patient with persistentdyspnea, associated with wheeze and coarse breath sounds. There is interfering with her activities of daily life. She has not had fevers. No other URI symptoms at this time although she has had possibly some sinus drainage. Certainly could just be a viral pathology however given pregnancy, tachycardia, and failure to resolve with standard treatment despite compliance as reported blood clot must be further  considered in this pregnant patient. we will therefore obtain a CT scan to see if there is other primary pulmonary pathology. I'll send a BNP to ensure that this does not appear to be cardiogenic cardiomyopathy with some element of failure although clinically that does not seem quite likely we will treat her with albuterol again here she already had one that and she is still having coarse breath sounds and seems winded. We will give her IV fluid, and I explained the risks benefits and alternative to CT scan patient agrees with the procedure, she will be shielded.  ----------------------------------------- 2:27 PM on 06/02/2017 -----------------------------------------  Patient declines CT at this time because she wanted me to talk to her OB/GYN becauseshe reports that he feels this is more of an upper airway problem involving her throat and he wants her to see ENT. Apparently she tried to make an ENT appointment couldn't do so because of her breathing. Dr. Georgianne Fick, who feels that she needs an ENT consult is "okay" with a CT scan that he was ENT to see her in the emergency room because he reports he heard stridor in the office. I have not heard stridor here she has no muffled voice he does not have a significant sore throat she has mild cobblestoning to her posterior pharynx. I will send a d-dimer to see if in the meantime we can stratify her risk little further, and we have given her breathing treatments her sats are good she doesn't appear to be in any imminent danger of decompensation and we'll see if ENT is willing to come evaluate the patient.  Clinical Course as of Jun 02 1742  Thu Jun 02, 2017  1454 discussed with ENT, Dr Pryor Ochoa, who will, evaluate the patient for upper airway issues,  Patient is declining CT at this time which we will therefore defer pending further information. However, we'll continue to observe the patient, Dr. of ENT will come evaluate the patient here in the emergency  department. This is been going on for 2 weeks and there is no evidence of imminent decline in health. I will also await results of d-dimer although again my concern is such that I may have to scan her irrespective of the result.  [JM]  1308 Pt in nad, awaiting ENT. D dimer negative which is reassuring. D/w dr. Georgianne Fick who agrees.  [JM]    Clinical Course User Index [JM] [name], MD   _______________  ----------------------------------------- 5:43 PM on 06/02/2017 -----------------------------------------  ENT has scoped the patient, they do see some question of change in the airway and they would like a CT with contrast to rule out subglottic stenosis. After discussing in conference with OB and ENT, they would also like a CT scan with PE protocolGiven persistent shortness of breath and tachycardic in this pregnant lady to rule that out definitively. D-dimer was noted however patient has ongoing and persistent symptoms of great concern of high risk factor.  Patient is resting comfortably remains somewhat tachycardic but no other acute distress, she still complains of shortness of breath but is in no way decompensated.patient agrees to scan  ----------------------------------------- 7:33 PM on 06/02/2017 -----------------------------------------  Very reassuring CT scan evidence of PE, aorta arrested pathology, CT of the neck is also very reassuring no evidence of significant stenosis. Patient's heart rate is now 100 after she's been told of her negative results she states her baseline heart rate is elevated. She states now on further reflection that her symptoms did start at the beginning her pregnancy and just get worse over the last 2 weeks which seems somewhat distinct from what she initially told me. In addition, I have talked to Dr. Star Age as well as Dr. Pryor Ochoa extensively about the findings. Both of them feel she is safe for discharge. Dr. Star Age or follow up on her possible mild  hyperthyroidism the office. Patient is eager to go home. Sats are 100% lungs at this time are clear, we have advised that she start omeprazole for possible reflux mediated reactive airway disease and I have advised to elevate the head of the bed. Patient states that for reflux, she is already sleeping on 2 pillows, we will advise that she continue do that as well. Extensive return precautions and follow-up given and understood, patient is very comfortable with this plan. Partially no evidence of pulmonary embolism or other pathology. At this time nor is there evidence of myocarditis endocarditis or pericarditis or pericardial effusion. There is no evidence of cardiomyopathy of pregnancy etc. We will discharge with close outpatient follow-up and return precautions. I will also have follow-up with cardiology range.   _____________________________   FINAL CLINICAL IMPRESSION(S) / ED DIAGNOSES  Final diagnoses:  None      This chart was dictated using voice recognition software.  Despite best efforts to proofread,  errors can occur which can change meaning.      [name], MD 06/02/17 1345    [name], MD 06/02/17 1429    [name], MD 06/02/17 1744    [name], MD 06/02/17 1744    [name], MD 06/02/17 Joen Laura

## 2017-06-03 ENCOUNTER — Telehealth: Payer: Self-pay

## 2017-06-03 NOTE — Telephone Encounter (Signed)
Spoke w/Creighton Vaught, MD w/[facility]. Pt was seen in and had CT. Initial report looked fine. 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 Due to pregnancy & pt likely planning C-Section, pt may need to deliver at tertiary hospital as she will not intubate easily. He will discuss w/pt in the event she has any issues over the weekend with breathing, he will advise her to report to ER. Cb#[phone].

## 2017-06-06 DIAGNOSIS — J386 Stenosis of larynx: Secondary | ICD-10-CM | POA: Diagnosis not present

## 2017-06-06 DIAGNOSIS — R490 Dysphonia: Secondary | ICD-10-CM | POA: Diagnosis not present

## 2017-06-06 DIAGNOSIS — R0603 Acute respiratory distress: Secondary | ICD-10-CM | POA: Diagnosis not present

## 2017-06-06 DIAGNOSIS — K219 Gastro-esophageal reflux disease without esophagitis: Secondary | ICD-10-CM | POA: Diagnosis not present

## 2017-06-07 ENCOUNTER — Ambulatory Visit (INDEPENDENT_AMBULATORY_CARE_PROVIDER_SITE_OTHER): Payer: 59 | Admitting: Obstetrics and Gynecology

## 2017-06-07 ENCOUNTER — Encounter: Payer: Self-pay | Admitting: Obstetrics and Gynecology

## 2017-06-07 VITALS — BP 130/80 | HR 116 | Wt 232.0 lb

## 2017-06-07 DIAGNOSIS — O34219 Maternal care for unspecified type scar from previous cesarean delivery: Secondary | ICD-10-CM

## 2017-06-07 DIAGNOSIS — O09299 Supervision of pregnancy with other poor reproductive or obstetric history, unspecified trimester: Secondary | ICD-10-CM

## 2017-06-07 DIAGNOSIS — Z3A17 17 weeks gestation of pregnancy: Secondary | ICD-10-CM

## 2017-06-07 DIAGNOSIS — J386 Stenosis of larynx: Secondary | ICD-10-CM

## 2017-06-07 DIAGNOSIS — O09899 Supervision of other high risk pregnancies, unspecified trimester: Secondary | ICD-10-CM

## 2017-06-07 DIAGNOSIS — Z8632 Personal history of gestational diabetes: Secondary | ICD-10-CM

## 2017-06-07 DIAGNOSIS — O99210 Obesity complicating pregnancy, unspecified trimester: Secondary | ICD-10-CM

## 2017-06-07 NOTE — Progress Notes (Signed)
ROB SOB/PF 200

## 2017-06-07 NOTE — Progress Notes (Signed)
Routine Prenatal Care Visit  Subjective  Mallory Pearson is a 31 y.o. G4P2011 at [redacted]w[redacted]d being seen today for ongoing prenatal care.  She is currently monitored for the following issues for this high-risk pregnancy and has DEPRESSION; ALLERGIC RHINITIS, SEASONAL; Benign essential hypertension antepartum; Hx of preeclampsia, prior pregnancy, currently pregnant, unspecified trimester; History of cesarean section complicating pregnancy; History of gestational diabetes in prior pregnancy, currently pregnant; Short interval between pregnancies affecting pregnancy, antepartum; Maternal obesity, antepartum; and High-risk pregnancy supervision, unspecified trimester on her problem list.  ----------------------------------------------------------------------------------- Patient reports continued SOB particularly with exertion.  Was started on Pulmicort, has follow up in place with [facility] ENT.   Contractions: Not present. Vag. Bleeding: None.   . Denies leaking of fluid.  ----------------------------------------------------------------------------------- The following portions of the patient's history were reviewed and updated as appropriate: allergies, current medications, past family history, past medical history, past social history, past surgical history and problem list. Problem list updated.   Objective  Blood pressure 130/80, pulse (!) 116, weight 232 lb (105.2 kg), last menstrual period 02/06/2017, unknown if currently breastfeeding. Pregravid weight 222 lb (100.7 kg) Total Weight Gain 10 lb (4.536 kg) Urinalysis: Urine Protein: Negative Urine Glucose: Negative  Fetal Status: Fetal Heart Rate (bpm): 160         General:  Alert, oriented and cooperative. Patient is in no acute distress.  Skin: Skin is warm and dry. No rash noted.   Cardiovascular: Normal heart rate noted  Respiratory: Normal respiratory effort, no problems with respiration noted  Abdomen: Soft, gravid, appropriate for  gestational age. Pain/Pressure: Absent     Pelvic:  Cervical exam deferred        Extremities: Normal range of motion.     ental Status: Normal mood and affect. Normal behavior. Normal judgment and thought content.     Assessment   31 y.o. [id number] at [redacted]w[redacted]d by  11/13/2017, by Last Menstrual Period presenting for routine prenatal visit  Plan   pregnancy#4 Problems (from 02/06/17 to present)    Problem Noted Resolved   High-risk pregnancy supervision, unspecified trimester 04/12/2017 by Malachy Mood, MD No   Priority:  High     Overview Addendum 04/25/2017  1:24 PM by Malachy Mood, MD    [facility] Prenatal Labs  Dating  Blood type: A positive  Genetic Screen 1 Screen:    AFP:     Quad:     NIPS: Antibody:Negative  Anatomic [place]  Rubella: Immune Varicella: Immune  GTT Early:129    Third trimester:  RPR: NR  Rhogam  HBsAg: Negative  TDaP vaccine                       Flu Shot: HIV: Negative  Baby Food                                GBS:   Contraception  Pap: NIL HPV negative 04/11/17  CBB   Baseline P/C ratio 169mg /dL  CS/VBAC    Support Person               Maternal obesity, antepartum 04/12/2017 by Malachy Mood, MD No   Overview Signed 04/12/2017  8:53 PM by Malachy Mood, MD    BMI >=40 [ ]  early 1h gtt -  [ ]  u/s for dating [ ]   [ ]  nutritional goals [ ]  folic acid 1mg  [ ]  bASA (>12 weeks) [ ]   consider nutrition consult [ ]  consider maternal EKG 1st trimester [ ]  Growth u/s 28 [ ] , 32 [ ] , 36 weeks [ ]  [ ]  NST/AFI weekly 36+ weeks (36[] , 37[] , 38[] , 39[] , 40[] ) [ ]  IOL by 41 weeks (scheduled, prn [] )       Hx of preeclampsia, prior pregnancy, currently pregnant, unspecified trimester 04/11/2017 by Malachy Mood, MD No   Overview Addendum 04/14/2017  1:06 PM by Malachy Mood, MD    [X]  Baseline P/C ratio 169mg /dL [ ]  Aspirin 81 mg daily after 12 weeks; discontinue after 36 weeks   Lab Results  Component Value Date   PLT 272 04/11/2017    CREATININE 0.64 04/11/2017   AST 8 04/11/2017   ALT 6 04/11/2017          History of cesarean section complicating pregnancy 03/11/7340 by Malachy Mood, MD No   Overview Signed 04/12/2017  8:52 PM by Malachy Mood, MD    Desires repeat      History of gestational diabetes in prior pregnancy, currently pregnant 04/11/2017 by Malachy Mood, MD No   Overview Signed 04/12/2017  8:52 PM by Malachy Mood, MD    HgbA1C at [facility], early 1-hr ordered      Short interval between pregnancies affecting pregnancy, antepartum 04/11/2017 by Malachy Mood, MD No   DEPRESSION 07/06/2010 by Owens Loffler, MD No   Overview Signed 10/14/2010  6:18 PM by Interface, Problem List In    Qualifier: Diagnosis of  By: Copland MD, Spencer            Preterm labor symptoms and general obstetric precautions including but not limited to vaginal bleeding, contractions, leaking of fluid and fetal movement were reviewed in detail with the patient. Please refer to After Visit Summary for other counseling recommendations.   Return in about 1 week (around 06/14/2017) for ROB-Car[facility] Murakami. - 1 week ROB - DP referral - waiting to see ENT at [facility], Depending on time frame and expected clinical course may require tertiary care center for delivery with ENT present should airway problems emerge and emergency tracheotomy be required  - Question benefit of CPAP at night -Work at [employer] has had influenza vaccination but possibly out of work to avoid URI exposures?

## 2017-06-08 ENCOUNTER — Encounter: Payer: Self-pay | Admitting: Obstetrics and Gynecology

## 2017-06-15 ENCOUNTER — Ambulatory Visit (INDEPENDENT_AMBULATORY_CARE_PROVIDER_SITE_OTHER): Payer: 59 | Admitting: Obstetrics and Gynecology

## 2017-06-15 VITALS — BP 122/82 | HR 131 | Wt 232.0 lb

## 2017-06-15 DIAGNOSIS — Z3A18 18 weeks gestation of pregnancy: Secondary | ICD-10-CM

## 2017-06-15 DIAGNOSIS — O0990 Supervision of high risk pregnancy, unspecified, unspecified trimester: Secondary | ICD-10-CM

## 2017-06-15 NOTE — Progress Notes (Signed)
ROB

## 2017-06-16 ENCOUNTER — Ambulatory Visit: Payer: 59

## 2017-06-17 NOTE — Progress Notes (Signed)
Routine Prenatal Care Visit  Subjective  Mallory Pearson is a 31 y.o. G4P2011 at [redacted]w[redacted]d being seen today for ongoing prenatal care.  She is currently monitored for the following issues for this high-risk pregnancy and has DEPRESSION; ALLERGIC RHINITIS, SEASONAL; Benign essential hypertension antepartum; Hx of preeclampsia, prior pregnancy, currently pregnant, unspecified trimester; History of cesarean section complicating pregnancy; History of gestational diabetes in prior pregnancy, currently pregnant; Short interval between pregnancies affecting pregnancy, antepartum; Maternal obesity, antepartum; and High-risk pregnancy supervision, unspecified trimester on her problem list.  ----------------------------------------------------------------------------------- Patient reports SOB stable.   Contractions: Not present. Vag. Bleeding: None.   . Denies leaking of fluid.  ----------------------------------------------------------------------------------- The following portions of the patient's history were reviewed and updated as appropriate: allergies, current medications, past family history, past medical history, past social history, past surgical history and problem list. Problem list updated.   Objective  Blood pressure 122/82, pulse (!) 131, weight 232 lb (105.2 kg), last menstrual period 02/06/2017, unknown if currently breastfeeding. Pregravid weight 222 lb (100.7 kg) Total Weight Gain 10 lb (4.536 kg) Urinalysis: Urine Protein: Negative Urine Glucose: Negative  Fetal Status: Fetal Heart Rate (bpm): 150         General:  Alert, oriented and cooperative. Patient is in no acute distress.  Skin: Skin is warm and dry. No rash noted.   Cardiovascular: Normal heart rate noted  Respiratory: Normal respiratory effort, no problems with respiration noted  Abdomen: Soft, gravid, appropriate for gestational age. Pain/Pressure: Absent     Pelvic:  Cervical exam deferred        Extremities: Normal  range of motion.     ental Status: Normal mood and affect. Normal behavior. Normal judgment and thought content.     Assessment   31 y.o. [id number] at [redacted]w[redacted]d by  11/13/2017, by Last Menstrual Period presenting for routine prenatal visit  Plan   pregnancy#4 Problems (from 02/06/17 to present)    Problem Noted Resolved   High-risk pregnancy supervision, unspecified trimester 04/12/2017 by Malachy Mood, MD No   Priority:  High     Overview Addendum 04/25/2017  1:24 PM by Malachy Mood, MD    [facility] Prenatal Labs  Dating  Blood type: A positive  Genetic Screen 1 Screen:    AFP:     Quad:     NIPS: Antibody:Negative  Anatomic [place]  Rubella: Immune Varicella: Immune  GTT Early:129    Third trimester:  RPR: NR  Rhogam  HBsAg: Negative  TDaP vaccine                       Flu Shot: HIV: Negative  Baby Food                                GBS:   Contraception  Pap: NIL HPV negative 04/11/17  CBB   Baseline P/C ratio 169mg /dL  CS/VBAC    Support Person               Maternal obesity, antepartum 04/12/2017 by Malachy Mood, MD No   Overview Signed 04/12/2017  8:53 PM by Malachy Mood, MD    BMI >=40 [ ]  early 1h gtt -  [ ]  u/s for dating [ ]   [ ]  nutritional goals [ ]  folic acid 1mg  [ ]  bASA (>12 weeks) [ ]  consider nutrition consult [ ]  consider maternal EKG 1st trimester [ ]  Growth u/s  28 [ ] , 32 [ ] , 36 weeks [ ]  [ ]  NST/AFI weekly 36+ weeks (36[] , 37[] , 38[] , 39[] , 40[] ) [ ]  IOL by 41 weeks (scheduled, prn [] )       Hx of preeclampsia, prior pregnancy, currently pregnant, unspecified trimester 04/11/2017 by Malachy Mood, MD No   Overview Addendum 04/14/2017  1:06 PM by Malachy Mood, MD    [X]  Baseline P/C ratio 169mg /dL [ ]  Aspirin 81 mg daily after 12 weeks; discontinue after 36 weeks   Lab Results  Component Value Date   PLT 272 04/11/2017   CREATININE 0.64 04/11/2017   AST 8 04/11/2017   ALT 6 04/11/2017          History of cesarean  section complicating pregnancy 04/09/6961 by Malachy Mood, MD No   Overview Signed 04/12/2017  8:52 PM by Malachy Mood, MD    Desires repeat      History of gestational diabetes in prior pregnancy, currently pregnant 04/11/2017 by Malachy Mood, MD No   Overview Signed 04/12/2017  8:52 PM by Malachy Mood, MD    HgbA1C at [facility], early 1-hr ordered      Short interval between pregnancies affecting pregnancy, antepartum 04/11/2017 by Malachy Mood, MD No   DEPRESSION 07/06/2010 by Owens Loffler, MD No   Overview Signed 10/14/2010  6:18 PM by Interface, Problem List In    Qualifier: Diagnosis of  By: Copland MD, Spencer            Preterm labor symptoms and general obstetric precautions including but not limited to vaginal bleeding, contractions, leaking of fluid and fetal movement were reviewed in detail with the patient. Please refer to After Visit Summary for other counseling recommendations.   - Surgery at [facility] [place] scheduled end of October, written out of work till two weeks postop to see how breathing improves.  Feel that risk of URI in hospital environment to great  - continue weekly follow up to assess symptoms Return in about 1 week (around 06/22/2017) for ROB.

## 2017-06-19 ENCOUNTER — Encounter: Payer: Self-pay | Admitting: Obstetrics and Gynecology

## 2017-06-20 ENCOUNTER — Other Ambulatory Visit: Payer: Self-pay | Admitting: Obstetrics and Gynecology

## 2017-06-20 MED ORDER — OMEPRAZOLE 40 MG PO CPDR: 40.0000 mg | DELAYED_RELEASE_CAPSULE | Freq: Every day | ORAL | 11 refills | Status: DC

## 2017-06-21 ENCOUNTER — Telehealth: Payer: Self-pay

## 2017-06-21 NOTE — Telephone Encounter (Signed)
FMLA/DISABILITY forms (2) filled out for [employer] and given to [place] for processing.

## 2017-06-22 ENCOUNTER — Ambulatory Visit (INDEPENDENT_AMBULATORY_CARE_PROVIDER_SITE_OTHER): Payer: 59 | Admitting: Obstetrics and Gynecology

## 2017-06-22 VITALS — BP 136/76 | HR 115 | Wt 234.0 lb

## 2017-06-22 DIAGNOSIS — Z8632 Personal history of gestational diabetes: Secondary | ICD-10-CM

## 2017-06-22 DIAGNOSIS — O99210 Obesity complicating pregnancy, unspecified trimester: Secondary | ICD-10-CM

## 2017-06-22 DIAGNOSIS — Z3A19 19 weeks gestation of pregnancy: Secondary | ICD-10-CM

## 2017-06-22 DIAGNOSIS — O09899 Supervision of other high risk pregnancies, unspecified trimester: Secondary | ICD-10-CM

## 2017-06-22 DIAGNOSIS — O09299 Supervision of pregnancy with other poor reproductive or obstetric history, unspecified trimester: Secondary | ICD-10-CM

## 2017-06-22 DIAGNOSIS — O34219 Maternal care for unspecified type scar from previous cesarean delivery: Secondary | ICD-10-CM

## 2017-06-22 NOTE — Progress Notes (Signed)
ROB Anatomy scan NV

## 2017-06-23 DIAGNOSIS — R000 Tachycardia, unspecified: Secondary | ICD-10-CM | POA: Diagnosis not present

## 2017-06-23 DIAGNOSIS — Z01818 Encounter for other preprocedural examination: Secondary | ICD-10-CM | POA: Diagnosis not present

## 2017-06-23 DIAGNOSIS — Z01810 Encounter for preprocedural cardiovascular examination: Secondary | ICD-10-CM | POA: Diagnosis not present

## 2017-06-23 DIAGNOSIS — O162 Unspecified maternal hypertension, second trimester: Secondary | ICD-10-CM | POA: Diagnosis not present

## 2017-06-23 DIAGNOSIS — O0992 Supervision of high risk pregnancy, unspecified, second trimester: Secondary | ICD-10-CM | POA: Diagnosis not present

## 2017-06-23 DIAGNOSIS — I471 Supraventricular tachycardia: Secondary | ICD-10-CM | POA: Diagnosis not present

## 2017-06-23 DIAGNOSIS — K219 Gastro-esophageal reflux disease without esophagitis: Secondary | ICD-10-CM | POA: Diagnosis not present

## 2017-06-23 NOTE — Progress Notes (Signed)
Routine Prenatal Care Visit  Subjective  Mallory Pearson is a 31 y.o. G4P2011 at [redacted]w[redacted]d being seen today for ongoing prenatal care.  She is currently monitored for the following issues for this high-risk pregnancy and has DEPRESSION; ALLERGIC RHINITIS, SEASONAL; Benign essential hypertension antepartum; Hx of preeclampsia, prior pregnancy, currently pregnant, unspecified trimester; History of cesarean section complicating pregnancy; History of gestational diabetes in prior pregnancy, currently pregnant; Short interval between pregnancies affecting pregnancy, antepartum; Maternal obesity, antepartum; and High-risk pregnancy supervision, unspecified trimester on her problem list.  ----------------------------------------------------------------------------------- Patient reports Continuous to have SOB but stable, surgical intervention through [facility] ENT planned for end of the month.  Was treated with antibiotics and steroid for URI,  No fever, no chills still having some postnasal drip.   Contractions: Not present. Vag. Bleeding: None.  Movement: Present. Denies leaking of fluid.  ----------------------------------------------------------------------------------- The following portions of the patient's history were reviewed and updated as appropriate: allergies, current medications, past family history, past medical history, past social history, past surgical history and problem list. Problem list updated.   Objective  Blood pressure 136/76, pulse (!) 115, weight 234 lb (106.1 kg), last menstrual period 02/06/2017, unknown if currently breastfeeding. Pregravid weight 222 lb (100.7 kg) Total Weight Gain 12 lb (5.443 kg) Urinalysis: Urine Protein: Negative Urine Glucose: Negative  Fetal Status: Fetal Heart Rate (bpm): 145   Movement: Present     General:  Alert, oriented and cooperative. Patient is in no acute distress.  Skin: Skin is warm and dry. No rash noted.   Cardiovascular: Normal heart rate  noted  Respiratory: Normal respiratory effort, continued stridor, lung field otherwise clear  Abdomen: Soft, gravid, appropriate for gestational age. Pain/Pressure: Absent     Pelvic:  Cervical exam deferred        Extremities: Normal range of motion.     ental Status: Normal mood and affect. Normal behavior. Normal judgment and thought content.     Assessment   31 y.o. [id number] at [redacted]w[redacted]d by  11/13/2017, by Last Menstrual Period presenting for routine prenatal visit  Plan   pregnancy#4 Problems (from 02/06/17 to present)    Problem Noted Resolved   High-risk pregnancy supervision, unspecified trimester 04/12/2017 by Malachy Mood, MD No   Priority:  High     Overview Addendum 04/25/2017  1:24 PM by Malachy Mood, MD    [facility] Prenatal Labs  Dating  Blood type: A positive  Genetic Screen 1 Screen:    AFP:     Quad:     NIPS: Antibody:Negative  Anatomic [place]  Rubella: Immune Varicella: Immune  GTT Early:129    Third trimester:  RPR: NR  Rhogam  HBsAg: Negative  TDaP vaccine                       Flu Shot: HIV: Negative  Baby Food                                GBS:   Contraception  Pap: NIL HPV negative 04/11/17  CBB   Baseline P/C ratio 169mg /dL  CS/VBAC    Support Person               Maternal obesity, antepartum 04/12/2017 by Malachy Mood, MD No   Overview Signed 04/12/2017  8:53 PM by Malachy Mood, MD    BMI >=40 [ ]  early 1h gtt -  [ ]  u/s  for dating [ ]   [ ]  nutritional goals [ ]  folic acid 1mg  [ ]  bASA (>12 weeks) [ ]  consider nutrition consult [ ]  consider maternal EKG 1st trimester [ ]  Growth u/s 28 [ ] , 32 [ ] , 36 weeks [ ]  [ ]  NST/AFI weekly 36+ weeks (36[] , 37[] , 38[] , 39[] , 40[] ) [ ]  IOL by 41 weeks (scheduled, prn [] )       Hx of preeclampsia, prior pregnancy, currently pregnant, unspecified trimester 04/11/2017 by Malachy Mood, MD No   Overview Addendum 04/14/2017  1:06 PM by Malachy Mood, MD    [X]  Baseline P/C ratio 169mg /dL [  ] Aspirin 81 mg daily after 12 weeks; discontinue after 36 weeks   Lab Results  Component Value Date   PLT 272 04/11/2017   CREATININE 0.64 04/11/2017   AST 8 04/11/2017   ALT 6 04/11/2017          History of cesarean section complicating pregnancy 04/12/5783 by Malachy Mood, MD No   Overview Signed 04/12/2017  8:52 PM by Malachy Mood, MD    Desires repeat      History of gestational diabetes in prior pregnancy, currently pregnant 04/11/2017 by Malachy Mood, MD No   Overview Signed 04/12/2017  8:52 PM by Malachy Mood, MD    HgbA1C at [facility], early 1-hr ordered      Short interval between pregnancies affecting pregnancy, antepartum 04/11/2017 by Malachy Mood, MD No   DEPRESSION 07/06/2010 by Owens Loffler, MD No   Overview Signed 10/14/2010  6:18 PM by Interface, Problem List In    Qualifier: Diagnosis of  By: Copland MD, Spencer            Preterm labor symptoms and general obstetric precautions including but not limited to vaginal bleeding, contractions, leaking of fluid and fetal movement were reviewed in detail with the patient. Please refer to After Visit Summary for other counseling recommendations.   Return in about 1 week (around 06/29/2017) for ROB anatomy should be scheduled already next week (also need ROB in 2 weeks).

## 2017-06-29 ENCOUNTER — Ambulatory Visit (INDEPENDENT_AMBULATORY_CARE_PROVIDER_SITE_OTHER): Payer: 59 | Admitting: Obstetrics & Gynecology

## 2017-06-29 ENCOUNTER — Ambulatory Visit (INDEPENDENT_AMBULATORY_CARE_PROVIDER_SITE_OTHER): Payer: 59

## 2017-06-29 ENCOUNTER — Other Ambulatory Visit: Payer: 59

## 2017-06-29 ENCOUNTER — Encounter: Payer: 59 | Admitting: Obstetrics & Gynecology

## 2017-06-29 VITALS — BP 130/80 | HR 122 | Wt 235.0 lb

## 2017-06-29 DIAGNOSIS — O0990 Supervision of high risk pregnancy, unspecified, unspecified trimester: Secondary | ICD-10-CM | POA: Diagnosis not present

## 2017-06-29 DIAGNOSIS — O99210 Obesity complicating pregnancy, unspecified trimester: Secondary | ICD-10-CM

## 2017-06-29 DIAGNOSIS — O34219 Maternal care for unspecified type scar from previous cesarean delivery: Secondary | ICD-10-CM | POA: Diagnosis not present

## 2017-06-29 DIAGNOSIS — Z3A20 20 weeks gestation of pregnancy: Secondary | ICD-10-CM

## 2017-06-29 MED ORDER — OXYCODONE-ACETAMINOPHEN 5-325 MG PO TABS: 1.0000 | ORAL_TABLET | ORAL | 0 refills | Status: DC | PRN

## 2017-06-29 NOTE — Progress Notes (Signed)
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    I have personally reviewed images and report of recent ultrasound done at [facility].    Plan of management to be discussed with patient.

## 2017-07-04 DIAGNOSIS — O34219 Maternal care for unspecified type scar from previous cesarean delivery: Secondary | ICD-10-CM | POA: Diagnosis not present

## 2017-07-04 DIAGNOSIS — O99512 Diseases of the respiratory system complicating pregnancy, second trimester: Secondary | ICD-10-CM | POA: Diagnosis not present

## 2017-07-04 DIAGNOSIS — I471 Supraventricular tachycardia: Secondary | ICD-10-CM | POA: Diagnosis not present

## 2017-07-04 DIAGNOSIS — Z331 Pregnant state, incidental: Secondary | ICD-10-CM | POA: Diagnosis not present

## 2017-07-04 DIAGNOSIS — J386 Stenosis of larynx: Secondary | ICD-10-CM | POA: Diagnosis not present

## 2017-07-04 DIAGNOSIS — K219 Gastro-esophageal reflux disease without esophagitis: Secondary | ICD-10-CM | POA: Diagnosis not present

## 2017-07-04 DIAGNOSIS — O26892 Other specified pregnancy related conditions, second trimester: Secondary | ICD-10-CM | POA: Diagnosis not present

## 2017-07-04 DIAGNOSIS — Z3A21 21 weeks gestation of pregnancy: Secondary | ICD-10-CM | POA: Diagnosis not present

## 2017-07-04 DIAGNOSIS — R0602 Shortness of breath: Secondary | ICD-10-CM | POA: Diagnosis not present

## 2017-07-04 DIAGNOSIS — O09892 Supervision of other high risk pregnancies, second trimester: Secondary | ICD-10-CM | POA: Diagnosis not present

## 2017-07-06 ENCOUNTER — Ambulatory Visit (INDEPENDENT_AMBULATORY_CARE_PROVIDER_SITE_OTHER): Payer: 59 | Admitting: Obstetrics and Gynecology

## 2017-07-06 VITALS — BP 128/82 | HR 135 | Wt 238.0 lb

## 2017-07-06 DIAGNOSIS — O99210 Obesity complicating pregnancy, unspecified trimester: Secondary | ICD-10-CM

## 2017-07-06 DIAGNOSIS — Z3A21 21 weeks gestation of pregnancy: Secondary | ICD-10-CM

## 2017-07-06 DIAGNOSIS — Z8632 Personal history of gestational diabetes: Secondary | ICD-10-CM

## 2017-07-06 DIAGNOSIS — O09899 Supervision of other high risk pregnancies, unspecified trimester: Secondary | ICD-10-CM

## 2017-07-06 DIAGNOSIS — O09299 Supervision of pregnancy with other poor reproductive or obstetric history, unspecified trimester: Secondary | ICD-10-CM

## 2017-07-06 DIAGNOSIS — O0990 Supervision of high risk pregnancy, unspecified, unspecified trimester: Secondary | ICD-10-CM

## 2017-07-06 NOTE — Progress Notes (Signed)
ROB

## 2017-07-07 NOTE — Progress Notes (Signed)
Breathing improved, still some residual hoarseness following surgery but otherwise doing well

## 2017-07-08 ENCOUNTER — Encounter: Payer: 59 | Admitting: Obstetrics and Gynecology

## 2017-07-13 ENCOUNTER — Encounter: Payer: Self-pay | Admitting: Obstetrics and Gynecology

## 2017-07-13 ENCOUNTER — Other Ambulatory Visit: Payer: Self-pay | Admitting: Obstetrics and Gynecology

## 2017-07-13 DIAGNOSIS — J988 Other specified respiratory disorders: Secondary | ICD-10-CM | POA: Insufficient documentation

## 2017-07-13 DIAGNOSIS — O99210 Obesity complicating pregnancy, unspecified trimester: Secondary | ICD-10-CM

## 2017-07-13 DIAGNOSIS — O09899 Supervision of other high risk pregnancies, unspecified trimester: Secondary | ICD-10-CM

## 2017-07-13 DIAGNOSIS — O09299 Supervision of pregnancy with other poor reproductive or obstetric history, unspecified trimester: Secondary | ICD-10-CM

## 2017-07-13 DIAGNOSIS — O0990 Supervision of high risk pregnancy, unspecified, unspecified trimester: Secondary | ICD-10-CM

## 2017-07-13 DIAGNOSIS — Z8632 Personal history of gestational diabetes: Secondary | ICD-10-CM

## 2017-07-13 DIAGNOSIS — O34219 Maternal care for unspecified type scar from previous cesarean delivery: Secondary | ICD-10-CM

## 2017-07-18 ENCOUNTER — Ambulatory Visit (INDEPENDENT_AMBULATORY_CARE_PROVIDER_SITE_OTHER): Payer: 59 | Admitting: Obstetrics and Gynecology

## 2017-07-18 VITALS — BP 134/90 | HR 138 | Wt 239.0 lb

## 2017-07-18 DIAGNOSIS — O34219 Maternal care for unspecified type scar from previous cesarean delivery: Secondary | ICD-10-CM

## 2017-07-18 DIAGNOSIS — O99210 Obesity complicating pregnancy, unspecified trimester: Secondary | ICD-10-CM

## 2017-07-18 DIAGNOSIS — Z8632 Personal history of gestational diabetes: Secondary | ICD-10-CM

## 2017-07-18 DIAGNOSIS — O0990 Supervision of high risk pregnancy, unspecified, unspecified trimester: Secondary | ICD-10-CM

## 2017-07-18 DIAGNOSIS — J988 Other specified respiratory disorders: Secondary | ICD-10-CM

## 2017-07-18 DIAGNOSIS — Z3A23 23 weeks gestation of pregnancy: Secondary | ICD-10-CM

## 2017-07-18 DIAGNOSIS — O09899 Supervision of other high risk pregnancies, unspecified trimester: Secondary | ICD-10-CM

## 2017-07-18 DIAGNOSIS — O09299 Supervision of pregnancy with other poor reproductive or obstetric history, unspecified trimester: Secondary | ICD-10-CM

## 2017-07-18 NOTE — Progress Notes (Signed)
ROB Discuss [facility]

## 2017-07-18 NOTE — Progress Notes (Signed)
Breathing remains much improved, DP consult pending to discuss delivery at tertiary care center given difficult airway.  Still tachycardic, normal TSH and FT4.  Asympatomic and normal EKG in ER.

## 2017-07-21 ENCOUNTER — Encounter: Payer: Self-pay | Admitting: Obstetrics and Gynecology

## 2017-07-21 DIAGNOSIS — J386 Stenosis of larynx: Secondary | ICD-10-CM | POA: Diagnosis not present

## 2017-07-26 ENCOUNTER — Other Ambulatory Visit: Payer: Self-pay | Admitting: Obstetrics and Gynecology

## 2017-07-26 MED ORDER — OMEPRAZOLE 40 MG PO CPDR
40.0000 mg | DELAYED_RELEASE_CAPSULE | Freq: Every day | ORAL | 11 refills | Status: DC
Start: 2017-07-26 — End: 2017-12-12

## 2017-07-27 ENCOUNTER — Ambulatory Visit (INDEPENDENT_AMBULATORY_CARE_PROVIDER_SITE_OTHER): Payer: 59 | Admitting: Obstetrics and Gynecology

## 2017-07-27 ENCOUNTER — Ambulatory Visit (INDEPENDENT_AMBULATORY_CARE_PROVIDER_SITE_OTHER): Payer: 59

## 2017-07-27 VITALS — BP 122/78 | HR 117 | Wt 243.0 lb

## 2017-07-27 DIAGNOSIS — Z3A20 20 weeks gestation of pregnancy: Secondary | ICD-10-CM | POA: Diagnosis not present

## 2017-07-27 DIAGNOSIS — O99210 Obesity complicating pregnancy, unspecified trimester: Secondary | ICD-10-CM

## 2017-07-27 DIAGNOSIS — J988 Other specified respiratory disorders: Secondary | ICD-10-CM

## 2017-07-27 DIAGNOSIS — O09299 Supervision of pregnancy with other poor reproductive or obstetric history, unspecified trimester: Secondary | ICD-10-CM

## 2017-07-27 DIAGNOSIS — Z3A24 24 weeks gestation of pregnancy: Secondary | ICD-10-CM

## 2017-07-27 DIAGNOSIS — Z8632 Personal history of gestational diabetes: Secondary | ICD-10-CM

## 2017-07-27 DIAGNOSIS — Z362 Encounter for other antenatal screening follow-up: Secondary | ICD-10-CM | POA: Diagnosis not present

## 2017-07-27 DIAGNOSIS — O09899 Supervision of other high risk pregnancies, unspecified trimester: Secondary | ICD-10-CM

## 2017-07-27 DIAGNOSIS — O0990 Supervision of high risk pregnancy, unspecified, unspecified trimester: Secondary | ICD-10-CM

## 2017-07-27 DIAGNOSIS — O34219 Maternal care for unspecified type scar from previous cesarean delivery: Secondary | ICD-10-CM

## 2017-07-27 DIAGNOSIS — Z113 Encounter for screening for infections with a predominantly sexual mode of transmission: Secondary | ICD-10-CM

## 2017-07-27 NOTE — Progress Notes (Signed)
Routine Prenatal Care Visit  Subjective  Mallory Pearson is a 31 y.o. G4P2011 at [redacted]w[redacted]d being seen today for ongoing prenatal care.  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  ----------------------------------------------------------------------------------- Patient reports no complaints.    .  .   . Denies leaking of fluid.  ----------------------------------------------------------------------------------- The following portions of the patient's history were reviewed and updated as appropriate: allergies, current medications, past family history, past medical history, past social history, past surgical history and problem list. Problem list updated.   Objective  Blood pressure 122/78, pulse (!) 117, weight 243 lb (110.2 kg), last menstrual period 02/06/2017, unknown if currently breastfeeding. Pregravid weight 222 lb (100.7 kg) Total Weight Gain 21 lb (9.526 kg) Urinalysis: Urine Protein: 1+ Urine Glucose: Negative  Fetal Status:           General:  Alert, oriented and cooperative. Patient is in no acute distress.  Skin: Skin is warm and dry. No rash noted.   Cardiovascular: Normal heart rate noted  Respiratory: Normal respiratory effort, no problems with respiration noted  Abdomen: Soft, gravid, appropriate for gestational age.       Pelvic:  Cervical exam deferred        Extremities: Normal range of motion.     ental Status: Normal mood and affect. Normal  behavior. Normal judgment and thought content.     Assessment   31 y.o. [id number] at [redacted]w[redacted]d by  11/13/2017, by Last Menstrual Period presenting for routine prenatal visit  Plan   pregnancy#4 Problems (from 02/06/17 to present)    Problem Noted Resolved   High-risk pregnancy supervision, unspecified trimester 04/12/2017 by Malachy Mood, MD No   Priority:  High     Overview Addendum 04/25/2017  1:24 PM by Malachy Mood, MD    [facility] Prenatal Labs  Dating  Blood type: A positive  Genetic Screen 1 Screen:    AFP:     Quad:     NIPS: Antibody:Negative  Anatomic [place]  Rubella: Immune Varicella: Immune  GTT Early:129    Third trimester:  RPR: NR  Rhogam  HBsAg: Negative  TDaP vaccine                       Flu Shot: HIV: Negative  Baby Food                                GBS:   Contraception  Pap: NIL HPV negative 04/11/17  CBB   Baseline P/C ratio 169mg /dL  CS/VBAC    Support Person               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 by Malachy Mood, MD No   Overview Addendum 07/13/2017  5:07 PM by Malachy Mood, MD    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 [facility]      Maternal obesity, antepartum 04/12/2017 by Malachy Mood, MD No   Overview Signed 04/12/2017  8:53 PM by Malachy Mood, MD    BMI >=40 [ ]  early 1h gtt -  [ ]  u/s for dating [ ]   [ ]   nutritional goals [ ]  folic acid 1mg  [ ]  bASA (>12 weeks) [ ]  consider nutrition consult [ ]  consider maternal EKG 1st trimester [ ]  Growth u/s 28 [ ] , 32 [ ] , 36 weeks [ ]  [ ]  NST/AFI weekly 36+ weeks (36[] , 37[] , 38[] , 39[] , 40[] ) [ ]  IOL by 41 weeks (scheduled, prn [] )       Hx of preeclampsia, prior pregnancy, currently pregnant, unspecified trimester 04/11/2017 by Malachy Mood, MD No   Overview Addendum 04/14/2017  1:06 PM by Malachy Mood, MD    [X]  Baseline P/C ratio 169mg /dL [ ]  Aspirin 81 mg daily after 12 weeks; discontinue after 36 weeks   Lab Results  Component Value Date   PLT 272  04/11/2017   CREATININE 0.64 04/11/2017   AST 8 04/11/2017   ALT 6 04/11/2017          History of cesarean section complicating pregnancy 1/0/0712 by Malachy Mood, MD No   Overview Signed 04/12/2017  8:52 PM by Malachy Mood, MD    Desires repeat      History of gestational diabetes in prior pregnancy, currently pregnant 04/11/2017 by Malachy Mood, MD No   Overview Signed 04/12/2017  8:52 PM by Malachy Mood, MD    HgbA1C at [facility], early 1-hr ordered      Short interval between pregnancies affecting pregnancy, antepartum 04/11/2017 by Malachy Mood, MD No   DEPRESSION 07/06/2010 by Owens Loffler, MD No   Overview Signed 10/14/2010  6:18 PM by Interface, Problem List In    Qualifier: Diagnosis of  By: Copland MD, Spencer            Preterm labor symptoms and general obstetric precautions including but not limited to vaginal bleeding, contractions, leaking of fluid and fetal movement were reviewed in detail with the patient. Please refer to After Visit Summary for other counseling recommendations.  - Follow up anatomy scan normal - Having trouble getting her leave of absence covered.  As documented in clinic notes 06/07/2017 and  06/15/2017 patient with dyspnea on exertion secondary to subglotic stenosis in pregnancy.  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

## 2017-07-27 NOTE — Progress Notes (Signed)
ROB

## 2017-08-08 DIAGNOSIS — J209 Acute bronchitis, unspecified: Secondary | ICD-10-CM | POA: Diagnosis not present

## 2017-08-08 DIAGNOSIS — J386 Stenosis of larynx: Secondary | ICD-10-CM | POA: Diagnosis not present

## 2017-08-08 DIAGNOSIS — R05 Cough: Secondary | ICD-10-CM | POA: Diagnosis not present

## 2017-08-11 ENCOUNTER — Ambulatory Visit
Admission: RE | Admit: 2017-08-11 | Discharge: 2017-08-11 | Disposition: A | Discharge status: Home / Self Care | Payer: 59 | Source: Ambulatory Visit | Attending: Maternal & Fetal Medicine | Admitting: Maternal & Fetal Medicine

## 2017-08-11 DIAGNOSIS — O09899 Supervision of other high risk pregnancies, unspecified trimester: Secondary | ICD-10-CM

## 2017-08-11 DIAGNOSIS — O34219 Maternal care for unspecified type scar from previous cesarean delivery: Secondary | ICD-10-CM | POA: Diagnosis not present

## 2017-08-11 DIAGNOSIS — Z3A26 26 weeks gestation of pregnancy: Secondary | ICD-10-CM | POA: Diagnosis not present

## 2017-08-11 DIAGNOSIS — J988 Other specified respiratory disorders: Secondary | ICD-10-CM

## 2017-08-11 DIAGNOSIS — O0990 Supervision of high risk pregnancy, unspecified, unspecified trimester: Secondary | ICD-10-CM

## 2017-08-11 DIAGNOSIS — Z8632 Personal history of gestational diabetes: Secondary | ICD-10-CM | POA: Insufficient documentation

## 2017-08-11 DIAGNOSIS — O09292 Supervision of pregnancy with other poor reproductive or obstetric history, second trimester: Secondary | ICD-10-CM | POA: Diagnosis not present

## 2017-08-11 DIAGNOSIS — O09299 Supervision of pregnancy with other poor reproductive or obstetric history, unspecified trimester: Secondary | ICD-10-CM

## 2017-08-11 DIAGNOSIS — O0992 Supervision of high risk pregnancy, unspecified, second trimester: Secondary | ICD-10-CM | POA: Insufficient documentation

## 2017-08-11 DIAGNOSIS — O99210 Obesity complicating pregnancy, unspecified trimester: Secondary | ICD-10-CM

## 2017-08-11 HISTORY — DX: Tachycardia, unspecified: R00.0

## 2017-08-11 LAB — PROTEIN / CREATININE RATIO, URINE
CREATININE, URINE: 121 mg/dL
Protein Creatinine Ratio: 0.16 mg/mg{Cre} — ABNORMAL HIGH (ref 0.00–0.15)
Total Protein, Urine: 19 mg/dL

## 2017-08-11 NOTE — Progress Notes (Signed)
MFM Consultation   31 yo G4P2012 at [redacted] weeks gestation is referred for consultation due to history of subglottic stenosis and query re need for tertiary care delivery. Her history is also significant for previous cesarean deliveries x 2, chronic hypertension, previous pregnancies complicated by GDM and preeclampsia.  In September of this year, she began to experience gradual onset of difficulty breathing, initially treated as new onset asthma.  As symptoms worsened( 'I sounded like Darth Vader'), providers astutely referred her to ENT.  Bronchoscopy and subsequent cervical CT were consistent with subglotic stenosis.  She was referred to [facility] where she was diagnosed with 50% stenosis and underwent laser therapy and dilation of the stenosed region in October.  In November, she had a follow up bronchoscopy and was noted to have 100% patency of the trachea.  She is scheduled to follow up with her ENT again in January.     Past Medical History:  Diagnosis Date  . Allergy   . Gestational diabetes   . History of chicken pox   . Hypertension   . Tachycardia    Past Surgical History:  Procedure Laterality Date  . CESAREAN SECTION  11/26/11  . CESAREAN SECTION N/A 04/01/2016   Procedure: CESAREAN SECTION;  Surgeon: Gae Dry, MD;  Location: [facility] ORS;  Service: Obstetrics;  Laterality: N/A;  Time of birth 18:50 Sex: Female Weight: 7 lbs, 13 oz   . KNEE SURGERY  2005  . LARYNX SURGERY    . TONSILLECTOMY     Meds Asa, prilosec, PNV Albuterolol amoxicillin (for sinusitis/bronchitis)  Allergies  Allergen Reactions  . Sulfa Antibiotics Hives  . 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 Undiagnosed cases may demonstrate an insidious onset in adults and manifest during the third trimester of pregnancy due to the hyperemia and edema of the mucosa and increased secretions.  Third trimester physiologic changes (raised diaphragm, decreased functional residual volume, decreased diffusion capacity/increased O2 consumption and CO2 production, decreased partial pressure of O2, decreased expiratory reserve volume; CO, SV and HR all increase) may further exacerbate the condition.  Trauma, prolonged intubation, reflux, sarcoidosis and wegners granulomatosis, are known causes, however, idiopathic SGS is more common in women.  The recurrence rate is estimated at ~60-80%.  Clearly, this can be a life--threatening emergency and associated with inability to intubate/ventillate patients in need for general anesthesia.  --Since Trichelle's trachea was last described as 100% patent and she is no longer symptomatic, she should be safe to deliver here in [place].  --She is scheduled to see her ENT for follow up in January.  If she has any evidence of restenosis, I would recommend transfer to [facility] for her repeat cesarean delivery.  -- I agree with planning an anesthesiology consultation so they can be familiar with her case.    Chronic Hypertension Her blood pressure is slightly elevated above her baseline today.  She has no clinical sxs c/w severe preeclampsia.  We reviewed warnings.  She will check her bp at home.   She continues on baby aspirin for preeclampsia risk reduction. Her P;C ratio todaywas normal (.16)  Tachycardia Her baseline HR is low 100s, she reports normal thyroid  function testing at [facility].  I would consider referral to Cardiology if findings persist.

## 2017-08-11 NOTE — Progress Notes (Signed)
Chart review    Past Medical History:  Diagnosis Date  . Allergy   . Gestational diabetes   . History of chicken pox   . Hypertension   . Tachycardia      Past Surgical History:  Procedure Laterality Date  . CESAREAN SECTION  11/26/11  . CESAREAN SECTION N/A 04/01/2016   Procedure: CESAREAN SECTION;  Surgeon: Gae Dry, MD;  Location: [facility] ORS;  Service: Obstetrics;  Laterality: N/A;  Time of birth 18:50 Sex: Female Weight: 7 lbs, 13 oz   . KNEE SURGERY  2005  . LARYNX SURGERY    . TONSILLECTOMY       OB History    Gravida Para Term Preterm AB Living   4 2 2   1 2    SAB TAB Ectopic Multiple Live Births   1     0 1      Social History   Socioeconomic History  . Marital status: Married    Spouse name: Not on file  . Number of children: Not on file  . Years of education: Not on file  . Highest education level: Not on file  Social Needs  . Financial resource strain: Not on file  . Food insecurity - worry: Not on file  . Food insecurity - inability: Not on file  . Transportation needs - medical: Not on file  . Transportation needs - non-medical: Not on file  Occupational History  . Occupation: cna    Employer: [employer]  Tobacco Use  . Smoking status: Never Smoker  . Smokeless tobacco: Never Used  Substance and Sexual Activity  . Alcohol use: No    Alcohol/week: 0.0 oz  . Drug use: No  . Sexual activity: Yes  Other Topics Concern  . Not on file  Social History Narrative   Regular exercise--yes     History reviewed. No pertinent family history.

## 2017-08-23 ENCOUNTER — Ambulatory Visit: Payer: Self-pay | Admitting: Emergency Medicine

## 2017-08-23 ENCOUNTER — Encounter: Payer: Self-pay | Admitting: Obstetrics and Gynecology

## 2017-08-23 VITALS — BP 110/80 | HR 130 | Temp 98.4°F | Resp 16

## 2017-08-23 DIAGNOSIS — H6983 Other specified disorders of Eustachian tube, bilateral: Secondary | ICD-10-CM

## 2017-08-23 MED ORDER — FLUTICASONE PROPIONATE 50 MCG/ACT NA SUSP: 2.0000 | Freq: Every day | NASAL | 6 refills | Status: DC

## 2017-08-23 NOTE — Progress Notes (Signed)
S:   left ear pain, no history of fever.  Pressure sensation.  T-98.4  Was using flovent last week.  Has called [facility] is am and waiting for them to answer.   O: TMs bilaterally are with poor light reflex and dull.  No erythema or injection is noted.  Moderate tenderness in the canal on the left during exam.  Mild tenderness on palpation of the tragus.  No exudate was seen in the canal.  Posterior pharynx with mild injection and drainage but no erythema or exudate was seen.  Neck is supple without adenopathy.  Lungs are clear bilaterally.  The patient also cannot get her ears to pop bilaterally. A: Eustachian tube dysfunction bilaterally. P: Flonase faxed to pharmacy.  Patient is to check with her OB/GYN prior to getting this medication.  She will also continue to see if she can get an appointment with [facility].

## 2017-08-24 DIAGNOSIS — H9202 Otalgia, left ear: Secondary | ICD-10-CM | POA: Diagnosis not present

## 2017-08-24 DIAGNOSIS — M26602 Left temporomandibular joint disorder, unspecified: Secondary | ICD-10-CM | POA: Diagnosis not present

## 2017-08-25 ENCOUNTER — Other Ambulatory Visit: Payer: 59

## 2017-08-25 ENCOUNTER — Ambulatory Visit (INDEPENDENT_AMBULATORY_CARE_PROVIDER_SITE_OTHER): Payer: 59 | Admitting: Obstetrics and Gynecology

## 2017-08-25 VITALS — BP 132/82 | HR 131 | Wt 252.0 lb

## 2017-08-25 DIAGNOSIS — O09299 Supervision of pregnancy with other poor reproductive or obstetric history, unspecified trimester: Secondary | ICD-10-CM

## 2017-08-25 DIAGNOSIS — R000 Tachycardia, unspecified: Secondary | ICD-10-CM

## 2017-08-25 DIAGNOSIS — Z8632 Personal history of gestational diabetes: Secondary | ICD-10-CM

## 2017-08-25 DIAGNOSIS — O0990 Supervision of high risk pregnancy, unspecified, unspecified trimester: Secondary | ICD-10-CM

## 2017-08-25 DIAGNOSIS — Z3A28 28 weeks gestation of pregnancy: Secondary | ICD-10-CM | POA: Diagnosis not present

## 2017-08-25 DIAGNOSIS — Z113 Encounter for screening for infections with a predominantly sexual mode of transmission: Secondary | ICD-10-CM | POA: Diagnosis not present

## 2017-08-25 DIAGNOSIS — J988 Other specified respiratory disorders: Secondary | ICD-10-CM

## 2017-08-25 DIAGNOSIS — O99210 Obesity complicating pregnancy, unspecified trimester: Secondary | ICD-10-CM

## 2017-08-25 DIAGNOSIS — O34219 Maternal care for unspecified type scar from previous cesarean delivery: Secondary | ICD-10-CM

## 2017-08-25 NOTE — Progress Notes (Signed)
Routine Prenatal Care Visit  Subjective  Mallory Pearson is a 31 y.o. [id number] at [redacted]w[redacted]d being seen today for ongoing prenatal care.  She is currently monitored for the following issues for this high-risk pregnancy and has DEPRESSION; ALLERGIC RHINITIS, SEASONAL; Benign essential hypertension antepartum; Hx of preeclampsia, prior pregnancy, currently pregnant, unspecified trimester; History of cesarean section complicating pregnancy; History of gestational diabetes in prior pregnancy, currently pregnant; Short interval between pregnancies affecting pregnancy, antepartum; Maternal obesity, antepartum; High-risk pregnancy supervision, unspecified trimester; Airway stricture; and Elevated glucose tolerance test on their problem list.  ----------------------------------------------------------------------------------- Patient reports no complaints.   Contractions: Not present. Vag. Bleeding: None.  Movement: Present. Denies leaking of fluid.  ----------------------------------------------------------------------------------- The following portions of the patient's history were reviewed and updated as appropriate: allergies, current medications, past family history, past medical history, past social history, past surgical history and problem list. Problem list updated.   Objective  Blood pressure 132/82, pulse (!) 131, weight 252 lb (114.3 kg), last menstrual period 02/06/2017, unknown if currently breastfeeding. Pregravid weight 222 lb (100.7 kg) Total Weight Gain 30 lb (13.6 kg) Urinalysis:      Fetal Status: Fetal Heart Rate (bpm): 145 Fundal Height: 29 cm Movement: Present     General:  Alert, oriented and cooperative. Patient is in no acute distress.  Skin: Skin is warm and dry. No rash noted.   Cardiovascular: Normal heart rate noted  Respiratory: Normal respiratory effort, no problems with respiration noted  Abdomen: Soft, gravid, appropriate for gestational age. Pain/Pressure: Absent       Pelvic:  Cervical exam deferred        Extremities: Normal range of motion.     ental Status: Normal mood and affect. Normal behavior. Normal judgment and thought content.     Assessment   31 y.o. [id number] at [redacted]w[redacted]d by  11/13/2017, by Last Menstrual Period presenting for routine prenatal visit  Plan   pregnancy#4 Problems (from 02/06/17 to present)    Problem Noted Resolved   High-risk pregnancy supervision, unspecified trimester 04/12/2017 by Malachy Mood, MD No   Priority:  High     Overview Addendum 08/26/2017  1:06 PM by Malachy Mood, MD    [facility]inic [facility] Prenatal Labs  Dating  Blood type: A positive  Genetic Screen 1 Screen:    AFP:     Quad:     NIPS: Antibody:Negative  Anatomic [place]  Rubella: Immune Varicella: Immune  GTT Early:129    Third trimester:185  RPR: NR  Rhogam  HBsAg: Negative  TDaP vaccine                       Flu Shot: HIV: Negative  Baby Food                                GBS:   Contraception  Pap: NIL HPV negative 04/11/17  CBB   Baseline P/C ratio 169mg /dL  CS/VBAC    Support Person               Elevated glucose tolerance test 08/26/2017 by Malachy Mood, MD No   Overview Signed 08/26/2017  1:06 PM by Malachy Mood, MD    185 on 1-hr      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 by Malachy Mood, MD No   Overview Addendum 07/13/2017  5:07 PM by Malachy Mood, MD    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 [facility]  Maternal obesity, antepartum 04/12/2017 by Malachy Mood, MD No   Overview Signed 04/12/2017  8:53 PM by Malachy Mood, MD    BMI >=40 [ ]  early 1h gtt -  [ ]  u/s for dating [ ]   [ ]  nutritional goals [ ]  folic acid 1mg  [ ]  bASA (>12 weeks) [ ]  consider nutrition consult [ ]  consider maternal EKG 1st trimester [ ]  Growth u/s 28 [ ] , 32 [ ] , 36 weeks [ ]  [ ]  NST/AFI weekly 36+ weeks (36[] , 37[] , 38[] , 39[] , 40[] ) [ ]  IOL by 41 weeks (scheduled, prn [] )       Hx of preeclampsia, prior pregnancy, currently  pregnant, unspecified trimester 04/11/2017 by Malachy Mood, MD No   Overview Addendum 04/14/2017  1:06 PM by Malachy Mood, MD    [X]  Baseline P/C ratio 169mg /dL [ ]  Aspirin 81 mg daily after 12 weeks; discontinue after 36 weeks   Lab Results  Component Value Date   PLT 272 04/11/2017   CREATININE 0.64 04/11/2017   AST 8 04/11/2017   ALT 6 04/11/2017          History of cesarean section complicating pregnancy 6/0/4540 by Malachy Mood, MD No   Overview Signed 04/12/2017  8:52 PM by Malachy Mood, MD    Desires repeat      History of gestational diabetes in prior pregnancy, currently pregnant 04/11/2017 by Malachy Mood, MD No   Overview Signed 04/12/2017  8:52 PM by Malachy Mood, MD    HgbA1C at [facility], early 1-hr ordered      Short interval between pregnancies affecting pregnancy, antepartum 04/11/2017 by Malachy Mood, MD No   DEPRESSION 07/06/2010 by Owens Loffler, MD No   Overview Signed 10/14/2010  6:18 PM by Interface, Problem List In    Qualifier: Diagnosis of  By: Copland MD, Spencer            Preterm labor symptoms and general obstetric precautions including but not limited to vaginal bleeding, contractions, leaking of fluid and fetal movement were reviewed in detail with the patient. Please refer to After Visit Summary for other counseling recommendations.   - underwent MFM consultation who states patient should be ok to delivery at [facility].  However, I also spoke with ENT with some concerns still if patient should require intubation during the course of her C-section that 1) she may be a difficult intubation still 2) Trauma of intubation may mean she is unable to be extubated and requires tracheostomy as we do not have the ability to laser the stenotic area here at [facility]  - Anesthesiology consult  - Follow up with her cardiologist tomorrow for continued tachycardia, thyroid panel ordered  - 28 week labs today  Return in about 2 weeks (around  09/08/2017) for ROB/Growth scan.

## 2017-08-25 NOTE — Progress Notes (Signed)
ROB 28 week labs 

## 2017-08-26 ENCOUNTER — Encounter: Payer: Self-pay | Admitting: Obstetrics and Gynecology

## 2017-08-26 ENCOUNTER — Other Ambulatory Visit: Payer: Self-pay | Admitting: Obstetrics and Gynecology

## 2017-08-26 DIAGNOSIS — R002 Palpitations: Secondary | ICD-10-CM | POA: Diagnosis not present

## 2017-08-26 DIAGNOSIS — R000 Tachycardia, unspecified: Secondary | ICD-10-CM | POA: Diagnosis not present

## 2017-08-26 DIAGNOSIS — J988 Other specified respiratory disorders: Secondary | ICD-10-CM

## 2017-08-26 DIAGNOSIS — R7309 Other abnormal glucose: Secondary | ICD-10-CM | POA: Insufficient documentation

## 2017-08-26 DIAGNOSIS — O0990 Supervision of high risk pregnancy, unspecified, unspecified trimester: Secondary | ICD-10-CM

## 2017-08-26 LAB — 28 WEEK RH+PANEL
BASOS ABS: 0 10*3/uL (ref 0.0–0.2)
BASOS: 0 %
EOS (ABSOLUTE): 0.1 10*3/uL (ref 0.0–0.4)
EOS: 1 %
Gestational Diabetes Screen: 185 mg/dL — ABNORMAL HIGH (ref 65–139)
HIV SCREEN 4TH GENERATION: NONREACTIVE
Hematocrit: 35.9 % (ref 34.0–46.6)
Hemoglobin: 11.8 g/dL (ref 11.1–15.9)
IMMATURE GRANS (ABS): 0.1 10*3/uL (ref 0.0–0.1)
IMMATURE GRANULOCYTES: 1 %
LYMPHS: 17 %
Lymphocytes Absolute: 1.7 10*3/uL (ref 0.7–3.1)
MCH: 28.1 pg (ref 26.6–33.0)
MCHC: 32.9 g/dL (ref 31.5–35.7)
MCV: 86 fL (ref 79–97)
MONOCYTES: 7 %
Monocytes Absolute: 0.8 10*3/uL (ref 0.1–0.9)
NEUTROS PCT: 74 %
Neutrophils Absolute: 7.7 10*3/uL — ABNORMAL HIGH (ref 1.4–7.0)
PLATELETS: 176 10*3/uL (ref 150–379)
RBC: 4.2 x10E6/uL (ref 3.77–5.28)
RDW: 13.5 % (ref 12.3–15.4)
RPR Ser Ql: NONREACTIVE
WBC: 10.2 10*3/uL (ref 3.4–10.8)

## 2017-08-26 LAB — THYROID PANEL WITH TSH
FREE THYROXINE INDEX: 1 — AB (ref 1.2–4.9)
T3 UPTAKE RATIO: 11 % — AB (ref 24–39)
T4 TOTAL: 9.1 ug/dL (ref 4.5–12.0)
TSH: 0.931 u[IU]/mL (ref 0.450–4.500)

## 2017-09-01 ENCOUNTER — Other Ambulatory Visit: Payer: 59

## 2017-09-05 ENCOUNTER — Encounter: Payer: Self-pay | Admitting: Obstetrics and Gynecology

## 2017-09-07 ENCOUNTER — Other Ambulatory Visit: Payer: 59

## 2017-09-07 ENCOUNTER — Encounter: Payer: 59 | Admitting: Obstetrics and Gynecology

## 2017-09-14 ENCOUNTER — Other Ambulatory Visit: Payer: 59

## 2017-09-14 ENCOUNTER — Ambulatory Visit (INDEPENDENT_AMBULATORY_CARE_PROVIDER_SITE_OTHER): Payer: 59

## 2017-09-14 ENCOUNTER — Ambulatory Visit (INDEPENDENT_AMBULATORY_CARE_PROVIDER_SITE_OTHER): Payer: 59 | Admitting: Obstetrics & Gynecology

## 2017-09-14 VITALS — BP 140/78 | Wt 249.0 lb

## 2017-09-14 DIAGNOSIS — R7302 Impaired glucose tolerance (oral): Secondary | ICD-10-CM | POA: Diagnosis not present

## 2017-09-14 DIAGNOSIS — O34219 Maternal care for unspecified type scar from previous cesarean delivery: Secondary | ICD-10-CM

## 2017-09-14 DIAGNOSIS — Z362 Encounter for other antenatal screening follow-up: Secondary | ICD-10-CM | POA: Diagnosis not present

## 2017-09-14 DIAGNOSIS — O99210 Obesity complicating pregnancy, unspecified trimester: Secondary | ICD-10-CM | POA: Diagnosis not present

## 2017-09-14 DIAGNOSIS — Z23 Encounter for immunization: Secondary | ICD-10-CM

## 2017-09-14 DIAGNOSIS — Z3A31 31 weeks gestation of pregnancy: Secondary | ICD-10-CM

## 2017-09-14 DIAGNOSIS — R7309 Other abnormal glucose: Secondary | ICD-10-CM

## 2017-09-14 DIAGNOSIS — O0990 Supervision of high risk pregnancy, unspecified, unspecified trimester: Secondary | ICD-10-CM | POA: Diagnosis not present

## 2017-09-14 DIAGNOSIS — Z3A28 28 weeks gestation of pregnancy: Secondary | ICD-10-CM | POA: Diagnosis not present

## 2017-09-14 DIAGNOSIS — O09299 Supervision of pregnancy with other poor reproductive or obstetric history, unspecified trimester: Secondary | ICD-10-CM

## 2017-09-14 DIAGNOSIS — O10019 Pre-existing essential hypertension complicating pregnancy, unspecified trimester: Secondary | ICD-10-CM

## 2017-09-14 MED ORDER — TETANUS-DIPHTH-ACELL PERTUSSIS 5-2.5-18.5 LF-MCG/0.5 IM SUSP
0.5000 mL | Freq: Once | INTRAMUSCULAR | Status: AC
  Administered 2017-09-14: 0.5 mL via INTRAMUSCULAR

## 2017-09-14 NOTE — Progress Notes (Signed)
No complaints. Tdap, Blood Transfusion consent today. Review of ULTRASOUND.    I have personally reviewed images and report of recent ultrasound done at [facility].    Plan of management to be discussed with patient.  Anes consult on 09/21/17 CS planned; location TBD 3 hour today  Barnett Applebaum, MD, [facility], [facility] 09/14/2017  10:51 AM

## 2017-09-15 ENCOUNTER — Other Ambulatory Visit: Payer: Self-pay | Admitting: Obstetrics and Gynecology

## 2017-09-15 ENCOUNTER — Encounter: Payer: Self-pay | Admitting: Obstetrics and Gynecology

## 2017-09-15 DIAGNOSIS — O24419 Gestational diabetes mellitus in pregnancy, unspecified control: Secondary | ICD-10-CM | POA: Insufficient documentation

## 2017-09-15 LAB — GESTATIONAL GLUCOSE TOLERANCE
GLUCOSE 3 HOUR GTT: 148 mg/dL — AB (ref 65–139)
Glucose, Fasting: 133 mg/dL — ABNORMAL HIGH (ref 65–94)
Glucose, GTT - 1 Hour: 235 mg/dL — ABNORMAL HIGH (ref 65–179)
Glucose, GTT - 2 Hour: 212 mg/dL — ABNORMAL HIGH (ref 65–154)

## 2017-09-15 MED ORDER — GLYBURIDE 2.5 MG PO TABS
2.5000 mg | ORAL_TABLET | Freq: Every day | ORAL | 3 refills | Status: DC
Start: 2017-09-15 — End: 2017-10-04

## 2017-09-15 NOTE — Progress Notes (Signed)
Has been checking fasting and getting values in the 110's start glyburide 2.5mg  evening, postprandials are running in range per patient and modifiable with diet

## 2017-09-16 ENCOUNTER — Encounter: Payer: Self-pay | Admitting: Obstetrics and Gynecology

## 2017-09-19 ENCOUNTER — Inpatient Hospital Stay: Admission: RE | Admit: 2017-09-19 | Payer: Self-pay | Source: Ambulatory Visit

## 2017-09-21 ENCOUNTER — Ambulatory Visit (INDEPENDENT_AMBULATORY_CARE_PROVIDER_SITE_OTHER): Payer: 59

## 2017-09-21 ENCOUNTER — Encounter
Admission: RE | Admit: 2017-09-21 | Discharge: 2017-09-21 | Disposition: A | Discharge status: Home / Self Care | Payer: 59 | Source: Ambulatory Visit | Attending: Anesthesiology | Admitting: Anesthesiology

## 2017-09-21 DIAGNOSIS — O09299 Supervision of pregnancy with other poor reproductive or obstetric history, unspecified trimester: Secondary | ICD-10-CM

## 2017-09-21 DIAGNOSIS — O10019 Pre-existing essential hypertension complicating pregnancy, unspecified trimester: Secondary | ICD-10-CM | POA: Diagnosis not present

## 2017-09-21 NOTE — Consult Note (Signed)
[facility] Anesthesia Consultation  KAITLYNN TRAMONTANA HKV:[id number] DOB: 1985-10-24 DOA: 09/21/2017 PCP: Hortencia Pilar, MD   Requesting physician: Dr. Kenton Kingfisher  Date of consultation: 09/21/17 Reason for consultation: hx of subglotic stentosis, obesity during pregnancy  CHIEF COMPLAINT:  Presenting for preoperative evaluation  HISTORY OF PRESENT ILLNESS: Mallory Pearson  is a 32 y.o. female with a known history of subglotic stenosis and obesity during pregnancy. Has had 2 prior c-sections and is planning on repeat cesarean for delivery. Earlier in her pregnancy he developed SOB and was seen in the ER, CT scan performed showed subglottic stenosis. She has subsequently undergone balloon dilation at [facility] in October 2018. She has had no further symptoms since surgery. On follow up in November at [facility] fiberoptic laryngoscopy showed widely patent subglottis.   PAST MEDICAL HISTORY:   Past Medical History:  Diagnosis Date  . Allergy   . Gestational diabetes   . History of chicken pox   . Hypertension   . Tachycardia     PAST SURGICAL HISTORY:  Past Surgical History:  Procedure Laterality Date  . CESAREAN SECTION  11/26/11  . CESAREAN SECTION N/A 04/01/2016   Procedure: CESAREAN SECTION;  Surgeon: Gae Dry, MD;  Location: [facility] ORS;  Service: Obstetrics;  Laterality: N/A;  Time of birth 18:50 Sex: Female Weight: 7 lbs, 13 oz   . KNEE SURGERY  2005  . LARYNX SURGERY    . TONSILLECTOMY      SOCIAL HISTORY:  Social History   Tobacco Use  . Smoking status: Never Smoker  . Smokeless tobacco: Never Used  Substance Use Topics  . Alcohol use: No    Alcohol/week: 0.0 oz    FAMILY HISTORY: No family history on file.  DRUG ALLERGIES:  Allergies  Allergen Reactions  . Sulfa Antibiotics Hives  . Sulfonamide Derivatives Hives    REVIEW OF SYSTEMS:   CONSTITUTIONAL: No fever, fatigue or weakness.  RESPIRATORY: No cough, shortness of breath,  wheezing.  CARDIOVASCULAR: No chest pain, orthopnea, edema.  GASTROINTESTINAL: No nausea, vomiting, diarrhea or abdominal pain.  HEMATOLOGY: No anemia, easy bruising or bleeding SKIN: No rash or lesion. NEUROLOGIC: No tingling, numbness, weakness.   MEDICATIONS AT HOME:  Prior to Admission medications   Medication Sig Start Date End Date Taking? Authorizing Provider  albuterol (PROVENTIL) (2.5 MG/3ML) 0.083% nebulizer solution Take 3 mLs (2.5 mg total) by nebulization every 6 (six) hours as needed for wheezing or shortness of breath. 05/29/17   Darel Hong, MD  aspirin 81 MG chewable tablet Chew by mouth daily.    [provider]  fluticasone (FLONASE) 50 MCG/ACT nasal spray Place 2 sprays into both nostrils daily. 08/23/17   Johnn Hai, PA-C  glyBURIDE (DIABETA) 2.5 MG tablet Take 1 tablet (2.5 mg total) by mouth daily with supper. 09/15/17   Malachy Mood, MD  omeprazole (PRILOSEC) 40 MG capsule Take 1 capsule (40 mg total) daily by mouth. 07/26/17 07/26/18  Malachy Mood, MD  Prenatal Vit-Fe Fumarate-FA (MULTIVITAMIN-PRENATAL) 27-0.8 MG TABS Take 1 tablet by mouth daily. Reported on 10/20/2015    [provider]  Spacer/Aero Chamber Mouthpiece MISC 1 Units by Does not apply route every 4 (four) hours as needed (wheezing). Patient not taking: Reported on 08/11/2017 05/29/17   Darel Hong, MD  Spacer/Aero-Holding Josiah Lobo ([place]) [place]  05/30/17   [provider]      PHYSICAL EXAMINATION:   GENERAL:  32 y.o.-year-old patient with no acute distress.  HEENT: Head atraumatic, normocephalic.  Oropharynx and nasopharynx clear, MP 1, normal dentition  LUNGS: Normal breath sounds bilaterally, no wheezing, rales,rhonchi or crepitation. No use of accessory muscles of respiration.  CARDIOVASCULAR: S1, S2 normal. No murmurs, rubs, or gallops.   EXTREMITIES: No pedal edema, cyanosis, or clubbing.     IMPRESSION AND PLAN:  32 yo F with hx  of subglottic stenosis s/p balloon dilation with no further symptoms.  -Plan to proceed with elective cesarean delivery at [facility] -Discussed that the likelihood of need for general anesthesia and intubation is very low -Most recent laryngoscopy also showed widely patent subglotis, so concern for difficulty with intubation is low, airway exam is favorable. Would plan to have smaller sized ETT and videolaryngoscope available as a precaution.    Emmie Niemann M.D on 09/21/2017 at 1:50 PM

## 2017-09-22 ENCOUNTER — Ambulatory Visit (INDEPENDENT_AMBULATORY_CARE_PROVIDER_SITE_OTHER): Payer: 59 | Admitting: Obstetrics & Gynecology

## 2017-09-22 ENCOUNTER — Telehealth: Payer: Self-pay | Admitting: Obstetrics & Gynecology

## 2017-09-22 VITALS — BP 130/90 | Wt 249.0 lb

## 2017-09-22 DIAGNOSIS — O09299 Supervision of pregnancy with other poor reproductive or obstetric history, unspecified trimester: Secondary | ICD-10-CM

## 2017-09-22 DIAGNOSIS — O10019 Pre-existing essential hypertension complicating pregnancy, unspecified trimester: Secondary | ICD-10-CM

## 2017-09-22 DIAGNOSIS — Z8632 Personal history of gestational diabetes: Secondary | ICD-10-CM | POA: Diagnosis not present

## 2017-09-22 DIAGNOSIS — Z3A32 32 weeks gestation of pregnancy: Secondary | ICD-10-CM | POA: Diagnosis not present

## 2017-09-22 DIAGNOSIS — O34219 Maternal care for unspecified type scar from previous cesarean delivery: Secondary | ICD-10-CM

## 2017-09-22 NOTE — Progress Notes (Signed)
  Subjective  Fetal Movement? yes Contractions? no Leaking Fluid? no Vaginal Bleeding? no BS log FBS 90s, PPBS <120 Denies ha, blurry vision, CP, SOB, edema Objective  BP 130/90   Wt 249 lb (112.9 kg)   LMP 02/06/2017 (Exact Date)   BMI 40.19 kg/m  General: NAD Pumonary: no increased work of breathing Abdomen: gravid, non-tender Extremities: no edema Psychiatric: mood appropriate, affect full  Assessment  32 y.o. [id number] at [redacted]w[redacted]d by  11/13/2017, by Last Menstrual Period presenting for routine prenatal visit  Plan   Problem List Items Addressed This Visit      Cardiovascular and Mediastinum   Benign essential hypertension, antepartum - Primary     Other   History of cesarean section complicating pregnancy   History of gestational diabetes in prior pregnancy, currently pregnant    Other Visit Diagnoses    [redacted] weeks gestation of pregnancy        [ x] Aspirin 81 mg daily after 12 weeks; discontinue after 36 weeks [ x] baseline labs with CBC, CMP, urine protein/creatinine ratio [ x] no BP meds unless BPs become elevated [ x] ultrasound for growth at 28, 32, 36 weeks [ x] Aspirin 81 mg daily after 12 weeks; discontinue after 36 weeks [ x] Baseline EKG  APT TWICE WEEKLY  Current antihypertensives:  None   Baseline and surveillance labs (pulled in from [facility], refresh links as needed)  Lab Results  Component Value Date   PLT 176 08/25/2017   CREATININE 0.57 06/02/2017   AST 8 04/11/2017   ALT 6 04/11/2017   PROTCRRATIO 0.16 (H) 08/11/2017    Antenatal Testing CHTN - O10.919  Group I  BP < 140/90, no preeclampsia, AGA,  nml AFV, +/- meds    Group II BP > 140/90, on meds, no preeclampsia, AGA, nml AFV  20-28-34-38  20-24-28-32-35-38  32//2 x wk  28//BPP wkly then 32//2 x wk  40 no meds; 39 meds  PRN or 37  Pre-eclampsia  GHTN - O13.9/Preeclampsia without severe features  - O14.00   Preeclampsia with severe features - O14.10  Q 3-4wks  Q 2 wks  28//BPP  wkly then 32//2 x wk  Inpatient  37  PRN or 34   A NST procedure was performed with FHR monitoring and a normal baseline established, appropriate time of 20-40 minutes of evaluation, and accels >2 seen w 15x15 characteristics.  Results show a REACTIVE NST.   Review of ULTRASOUND.    I have personally reviewed images and report of recent ultrasound done at [facility].    Plan of management to be discussed with patient.    AFI normal  PNV, FMC, PTL precautions, HTN and GDM discussed.  ANESTHESIA consult done, see note, OK for CS at [facility] for 10/25/17 (37+ weeks)  Barnett Applebaum, MD, [place] 09/22/2017  3:26 PM

## 2017-09-22 NOTE — Telephone Encounter (Signed)
-----   Message from Gae Dry, MD sent at 09/22/2017  3:19 PM EST ----- Regarding: CS Surgery Booking Request Patient Full Name:  Mallory Pearson  MRN: [id number]  DOB: [birth year]  Surgeon: Hoyt Koch, MD  Requested Surgery Date and Time: 10/25/17 Primary Diagnosis AND Code: Repeat, Hypertension (gestational and chronic), O10.019, O09.299, O34.219 Secondary Diagnosis and Code:  Surgical Procedure: Cesarean Section L&D Notification: Yes Admission Status: surgery admit Length of Surgery: 1.5 Special Case Needs: no H&P: yes (date) Phone Interview???: no Interpreter: Language:  Medical Clearance: no Special Scheduling Instructions: no

## 2017-09-22 NOTE — Telephone Encounter (Signed)
Lmtrc

## 2017-09-22 NOTE — Telephone Encounter (Signed)
Patient is aware of H&P at [facility] on 10/24/2017 @ 8:20am w/ Dr. Kenton Kingfisher, Pre-admit Testing afterwards, and OR on 10/25/17.

## 2017-09-26 ENCOUNTER — Encounter: Payer: Self-pay | Admitting: Obstetrics and Gynecology

## 2017-09-26 ENCOUNTER — Ambulatory Visit (INDEPENDENT_AMBULATORY_CARE_PROVIDER_SITE_OTHER): Payer: 59 | Admitting: Obstetrics and Gynecology

## 2017-09-26 VITALS — BP 134/96 | Wt 252.0 lb

## 2017-09-26 DIAGNOSIS — O0990 Supervision of high risk pregnancy, unspecified, unspecified trimester: Secondary | ICD-10-CM

## 2017-09-26 DIAGNOSIS — O24415 Gestational diabetes mellitus in pregnancy, controlled by oral hypoglycemic drugs: Secondary | ICD-10-CM | POA: Diagnosis not present

## 2017-09-26 DIAGNOSIS — Z3A33 33 weeks gestation of pregnancy: Secondary | ICD-10-CM

## 2017-09-26 DIAGNOSIS — O10019 Pre-existing essential hypertension complicating pregnancy, unspecified trimester: Secondary | ICD-10-CM

## 2017-09-26 DIAGNOSIS — O09299 Supervision of pregnancy with other poor reproductive or obstetric history, unspecified trimester: Secondary | ICD-10-CM

## 2017-09-26 DIAGNOSIS — O99210 Obesity complicating pregnancy, unspecified trimester: Secondary | ICD-10-CM

## 2017-09-26 DIAGNOSIS — O09899 Supervision of other high risk pregnancies, unspecified trimester: Secondary | ICD-10-CM

## 2017-09-26 DIAGNOSIS — O34219 Maternal care for unspecified type scar from previous cesarean delivery: Secondary | ICD-10-CM

## 2017-09-26 LAB — FETAL NONSTRESS TEST

## 2017-09-26 NOTE — Progress Notes (Signed)
Routine Prenatal Care Visit  Subjective  Mallory Pearson is a 32 y.o. [id number] at [redacted]w[redacted]d being seen today for ongoing prenatal care.  She is currently monitored for the following issues for this high-risk pregnancy and has DEPRESSION; ALLERGIC RHINITIS, SEASONAL; Benign essential hypertension, antepartum; Hx of preeclampsia, prior pregnancy, currently pregnant, unspecified trimester; History of cesarean section complicating pregnancy; History of gestational diabetes in prior pregnancy, currently pregnant; Short interval between pregnancies affecting pregnancy, antepartum; Maternal obesity, antepartum; High-risk pregnancy supervision, unspecified trimester; Airway stricture; Elevated glucose tolerance test; and Gestational diabetes on their problem list.  ----------------------------------------------------------------------------------- Patient reports no complaints.   Contractions: Not present. Vag. Bleeding: None.  Movement: Present. Denies leaking of fluid.  BG log brought in today.  Much > 50% of PP values in target range.  >50% of fasting > 95. Taking glyburide 2.5mg  without issues. No low BG values.   ----------------------------------------------------------------------------------- The following portions of the patient's history were reviewed and updated as appropriate: allergies, current medications, past family history, past medical history, past social history, past surgical history and problem list. Problem list updated.  Objective  Blood pressure (!) 134/96, weight 252 lb (114.3 kg), last menstrual period 02/06/2017, unknown if currently breastfeeding. Pregravid weight 222 lb (100.7 kg) Total Weight Gain 30 lb (13.6 kg) Urinalysis:      Fetal Status: Fetal Heart Rate (bpm): 150   Movement: Present     General:  Alert, oriented and cooperative. Patient is in no acute distress.  Skin: Skin is warm and dry. No rash noted.   Cardiovascular: Normal heart rate noted  Respiratory: Normal  respiratory effort, no problems with respiration noted  Abdomen: Soft, gravid, appropriate for gestational age. Pain/Pressure: Absent     Pelvic:  Cervical exam deferred        Extremities: Normal range of motion.     Mental Status: Normal mood and affect. Normal behavior. Normal judgment and thought content.   NST Baseline FHR: 155 beats/min Variability: moderate Accelerations: present Decelerations: absent Tocometry: not done  Interpretation:  INDICATIONS: gestational diabetes mellitus, chronic hypertension and obesity w BMI >40 RESULTS:  A NST procedure was performed with FHR monitoring and a normal baseline established, appropriate time of 20-40 minutes of evaluation, and accels >2 seen w 15x15 characteristics.  Results show a REACTIVE NST.   Assessment   32 y.o. K2H0623 at [redacted]w[redacted]d by  11/13/2017, by Last Menstrual Period presenting for routine prenatal visit  Plan   pregnancy#4 Problems (from 02/06/17 to present)    Problem Noted Resolved   Gestational diabetes 09/15/2017 by Malachy Mood, MD No   Overview Addendum 09/26/2017 11:26 AM by Will Bonnet, MD    Current Diabetic Medications:  Glyburide (initially 2.5mg  on 1/10, increased to 5mg  HS on 1/21) [x]  Aspirin 81 mg daily after 12 weeks; discontinue after 36 weeks (? A2/B GDM)  Required Referrals for A1GDM or A2GDM: [x]  Diabetes Education and Testing Supplies [x]  Nutrition Cousult  For A2/B GDM or higher classes of DM [x]  Diabetes Education and Testing Supplies [x]  Nutrition Counsult [ ]  Fetal ECHO after 22-24 weeks  [ ]  Eye exam for retina evaluation  [x]  Baseline EKG [x]  US fetal growth every 4 weeks starting at 28 weeks [x]  Twice weekly NST starting at [redacted] weeks gestation [ ]  Delivery planning contingent on fetal growth, AFI, glycemic control, and other co-morbidities but at least by 39 weeks  Baseline and surveillance labs (pulled in from [facility], refresh links as needed)  Lab Results  Component Value  Date    CREATININE 0.57 06/02/2017   AST 8 04/11/2017   ALT 6 04/11/2017   TSH 0.931 08/25/2017   PROTCRRATIO 0.16 (H) 08/11/2017   No results found for: HGBA1C  Antenatal Testing Class of DM U/S NST/AFI DELIVERY  Diabetes   A1 - good control - O24.410    A2 - good control - O24.419      A2  - poor control or poor compliance - O24.419, E11.65   (Macrosomia or polyhydramnios) **E11.65 is extra code for poor control**    A2/B - O24.919  and B-C O24.319  Poor control B-C or D-R-F-T - O24.319  or  Type I DM - O24.019  20-38  20-38  20-24-28-32-36   20-24-28-32-35-38//fetal echo  20-24-27-30-33-36-38//fetal echo  40  32//2 x wk  32//2 x wk   32//2 x wk  28//BPP wkly then 32//2 x wk  40  39  PRN   39  PRN       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 by Malachy Mood, MD No   Overview Addendum 07/13/2017  5:07 PM by Malachy Mood, MD    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     Maternal obesity, antepartum 04/12/2017 by Malachy Mood, MD No   Overview Signed 04/12/2017  8:53 PM by Malachy Mood, MD    BMI >=40 [ ]  early 1h gtt -  [ ]  u/s for dating [ ]   [ ]  nutritional goals [ ]  folic acid 1mg  [ ]  bASA (>12 weeks) [ ]  consider nutrition consult [ ]  consider maternal EKG 1st trimester [ ]  Growth u/s 28 [ ] , 32 [ ] , 36 weeks [ ]  [ ]  NST/AFI weekly 36+ weeks (36[] , 37[] , 38[] , 39[] , 40[] ) [ ]  IOL by 41 weeks (scheduled, prn [] )       High-risk pregnancy supervision, unspecified trimester 04/12/2017 by Malachy Mood, MD No   Overview Addendum 09/15/2017  5:13 PM by Malachy Mood, MD    [facility] Prenatal Labs  Dating  Blood type: A positive  Genetic Screen 1 Screen:    AFP:     Quad:     NIPS: Antibody:Negative  Anatomic [place]  Rubella: Immune Varicella: Immune  GTT Early:129    Third trimester:185 3-hr: 133 / 235 / 212 / 148 RPR: NR  Rhogam N/A HBsAg: Negative  TDaP vaccine                       Flu Shot: UTD HIV: Negative  Baby Food                                 GBS:   Contraception  Pap: NIL HPV negative 04/11/17  CBB   Baseline P/C ratio 169mg /dL  CS/VBAC    Support Person             Hx of preeclampsia, prior pregnancy, currently pregnant, unspecified trimester 04/11/2017 by Malachy Mood, MD No   Overview Addendum 04/14/2017  1:06 PM by Malachy Mood, MD    [X]  Baseline P/C ratio 169mg /dL [ ]  Aspirin 81 mg daily after 12 weeks; discontinue after 36 weeks   Lab Results  Component Value Date   PLT 272 04/11/2017   CREATININE 0.64 04/11/2017   AST 8 04/11/2017   ALT 6 04/11/2017       History of cesarean section complicating pregnancy 05/11/1739 by Malachy Mood, MD No   Overview Signed 04/12/2017  8:52 PM by Malachy Mood, MD    Desires repeat - schedule for 2/21      History of gestational diabetes in prior pregnancy, currently pregnant 04/11/2017 by Malachy Mood, MD No   Overview Signed 04/12/2017  8:52 PM by Malachy Mood, MD    HgbA1C at [facility], early 1-hr ordered      Short interval between pregnancies affecting pregnancy, antepartum 04/11/2017 by Malachy Mood, MD No   DEPRESSION 07/06/2010 by Owens Loffler, MD No   Overview Signed 10/14/2010  6:18 PM by Interface, Problem List In    Qualifier: Diagnosis of  By: Copland MD, Spencer        Preterm labor symptoms and general obstetric precautions including but not limited to vaginal bleeding, contractions, leaking of fluid and fetal movement were reviewed in detail with the patient. Please refer to After Visit Summary for other counseling recommendations.   Return for Keep previously schedule appts.  - increased glyburide to 5mg  QHS from 2.5 mg.  Encouraged protein snack at bed time.   Prentice Docker, MD  09/26/2017 11:47 AM

## 2017-09-28 ENCOUNTER — Other Ambulatory Visit: Payer: Self-pay | Admitting: Obstetrics and Gynecology

## 2017-09-28 DIAGNOSIS — Z8759 Personal history of other complications of pregnancy, childbirth and the puerperium: Secondary | ICD-10-CM

## 2017-09-29 ENCOUNTER — Other Ambulatory Visit: Payer: Self-pay | Admitting: Obstetrics and Gynecology

## 2017-09-29 ENCOUNTER — Ambulatory Visit (INDEPENDENT_AMBULATORY_CARE_PROVIDER_SITE_OTHER): Payer: 59

## 2017-09-29 ENCOUNTER — Other Ambulatory Visit: Payer: 59

## 2017-09-29 ENCOUNTER — Ambulatory Visit (INDEPENDENT_AMBULATORY_CARE_PROVIDER_SITE_OTHER): Payer: 59 | Admitting: Obstetrics & Gynecology

## 2017-09-29 VITALS — BP 140/88 | Wt 252.0 lb

## 2017-09-29 DIAGNOSIS — Z8759 Personal history of other complications of pregnancy, childbirth and the puerperium: Secondary | ICD-10-CM

## 2017-09-29 DIAGNOSIS — O09299 Supervision of pregnancy with other poor reproductive or obstetric history, unspecified trimester: Secondary | ICD-10-CM

## 2017-09-29 DIAGNOSIS — O24415 Gestational diabetes mellitus in pregnancy, controlled by oral hypoglycemic drugs: Secondary | ICD-10-CM

## 2017-09-29 DIAGNOSIS — O0903 Supervision of pregnancy with history of infertility, third trimester: Secondary | ICD-10-CM

## 2017-09-29 DIAGNOSIS — Z362 Encounter for other antenatal screening follow-up: Secondary | ICD-10-CM

## 2017-09-29 DIAGNOSIS — O0990 Supervision of high risk pregnancy, unspecified, unspecified trimester: Secondary | ICD-10-CM

## 2017-09-29 DIAGNOSIS — Z3A33 33 weeks gestation of pregnancy: Secondary | ICD-10-CM

## 2017-09-29 DIAGNOSIS — O34219 Maternal care for unspecified type scar from previous cesarean delivery: Secondary | ICD-10-CM

## 2017-09-29 NOTE — Progress Notes (Signed)
  Subjective  Fetal Movement? yes Contractions? no Leaking Fluid? no Vaginal Bleeding? no Denies ha, blurry vision, CP, SOB, epigastric pain. Objective  BP 140/88   Wt 252 lb (114.3 kg)   LMP 02/06/2017 (Exact Date)   BMI 40.67 kg/m  General: NAD Pumonary: no increased work of breathing Abdomen: gravid, non-tender Extremities: no edema Psychiatric: mood appropriate, affect full  A NST procedure was performed with FHR monitoring and a normal baseline established, appropriate time of 20-40 minutes of evaluation, and accels >2 seen w 15x15 characteristics.  Results show a REACTIVE NST.   Review of ULTRASOUND.    I have personally reviewed images and report of recent ultrasound done at [facility].    Plan of management to be discussed with patient.  Assessment  32 y.o. [id number] at [redacted]w[redacted]d by  11/13/2017, by Last Menstrual Period presenting for routine prenatal visit, with GEST DMA2 and chronic HTN, Obesity.  Prior CS. Subglottic stenosis.  Plan  NST twice weekly AFI weekly CS 37 weeks Monitor BS for GDMA2, cont Glyburide w recent dose change to 5 mg daily Monitor BP and s/sx preeclampsia advised.  Consider coming out of work soon (advised today) PNV, ASA, [place], PTL precautions discussed. [facility] Prenatal Labs  Dating [place] Blood type: A positive  Genetic Screen 1 Screen:  NEG Antibody:Negative  Anatomic [place] WS Rubella: Immune Varicella: Immune  GTT Early:129    Third tri:185 3-hr: 133 / 235 / 212 / 148 RPR: NR  Rhogam N/A HBsAg: Negative  TDaP vaccine      09/14/17    Flu Shot: UTD HIV: Negative  Baby Food          BREAST                      GBS:   Contraception         Unsure, likely minipill Pap: NIL HPV negative 04/11/17  CBB          no Baseline P/C ratio 169mg /dL  CS/VBAC         CS   Support Person         Husband    Mallory Applebaum, MD, [facility], [facility] 09/29/2017  2:25 PM

## 2017-09-30 ENCOUNTER — Encounter: Payer: Self-pay | Admitting: Obstetrics and Gynecology

## 2017-10-02 ENCOUNTER — Other Ambulatory Visit: Payer: Self-pay

## 2017-10-02 ENCOUNTER — Observation Stay: Payer: 59

## 2017-10-02 ENCOUNTER — Inpatient Hospital Stay
Admission: EM | Admit: 2017-10-02 | Discharge: 2017-10-04 | DRG: 833 | Disposition: A | Discharge status: Home / Self Care | Payer: 59 | Attending: Obstetrics and Gynecology | Admitting: Obstetrics and Gynecology

## 2017-10-02 DIAGNOSIS — Z8632 Personal history of gestational diabetes: Secondary | ICD-10-CM

## 2017-10-02 DIAGNOSIS — Z3A34 34 weeks gestation of pregnancy: Secondary | ICD-10-CM | POA: Diagnosis not present

## 2017-10-02 DIAGNOSIS — O0990 Supervision of high risk pregnancy, unspecified, unspecified trimester: Secondary | ICD-10-CM

## 2017-10-02 DIAGNOSIS — O09299 Supervision of pregnancy with other poor reproductive or obstetric history, unspecified trimester: Secondary | ICD-10-CM

## 2017-10-02 DIAGNOSIS — O10013 Pre-existing essential hypertension complicating pregnancy, third trimester: Secondary | ICD-10-CM | POA: Diagnosis not present

## 2017-10-02 DIAGNOSIS — R51 Headache: Secondary | ICD-10-CM | POA: Diagnosis not present

## 2017-10-02 DIAGNOSIS — R000 Tachycardia, unspecified: Secondary | ICD-10-CM | POA: Diagnosis not present

## 2017-10-02 DIAGNOSIS — O99613 Diseases of the digestive system complicating pregnancy, third trimester: Secondary | ICD-10-CM | POA: Diagnosis present

## 2017-10-02 DIAGNOSIS — R112 Nausea with vomiting, unspecified: Secondary | ICD-10-CM | POA: Diagnosis not present

## 2017-10-02 DIAGNOSIS — O24415 Gestational diabetes mellitus in pregnancy, controlled by oral hypoglycemic drugs: Secondary | ICD-10-CM

## 2017-10-02 DIAGNOSIS — I10 Essential (primary) hypertension: Secondary | ICD-10-CM | POA: Diagnosis not present

## 2017-10-02 DIAGNOSIS — O24414 Gestational diabetes mellitus in pregnancy, insulin controlled: Secondary | ICD-10-CM | POA: Diagnosis present

## 2017-10-02 DIAGNOSIS — Z7982 Long term (current) use of aspirin: Secondary | ICD-10-CM | POA: Diagnosis not present

## 2017-10-02 DIAGNOSIS — O119 Pre-existing hypertension with pre-eclampsia, unspecified trimester: Secondary | ICD-10-CM

## 2017-10-02 DIAGNOSIS — O34219 Maternal care for unspecified type scar from previous cesarean delivery: Secondary | ICD-10-CM | POA: Diagnosis present

## 2017-10-02 DIAGNOSIS — O99210 Obesity complicating pregnancy, unspecified trimester: Secondary | ICD-10-CM

## 2017-10-02 DIAGNOSIS — O26893 Other specified pregnancy related conditions, third trimester: Secondary | ICD-10-CM | POA: Diagnosis not present

## 2017-10-02 DIAGNOSIS — R030 Elevated blood-pressure reading, without diagnosis of hypertension: Secondary | ICD-10-CM | POA: Diagnosis present

## 2017-10-02 DIAGNOSIS — R7309 Other abnormal glucose: Secondary | ICD-10-CM

## 2017-10-02 DIAGNOSIS — O09899 Supervision of other high risk pregnancies, unspecified trimester: Secondary | ICD-10-CM

## 2017-10-02 DIAGNOSIS — O113 Pre-existing hypertension with pre-eclampsia, third trimester: Secondary | ICD-10-CM | POA: Diagnosis not present

## 2017-10-02 DIAGNOSIS — A084 Viral intestinal infection, unspecified: Secondary | ICD-10-CM | POA: Diagnosis present

## 2017-10-02 DIAGNOSIS — J988 Other specified respiratory disorders: Secondary | ICD-10-CM

## 2017-10-02 DIAGNOSIS — O139 Gestational [pregnancy-induced] hypertension without significant proteinuria, unspecified trimester: Secondary | ICD-10-CM | POA: Diagnosis present

## 2017-10-02 LAB — COMPREHENSIVE METABOLIC PANEL
ALBUMIN: 3.3 g/dL — AB (ref 3.5–5.0)
ALK PHOS: 96 U/L (ref 38–126)
ALT: 12 U/L — ABNORMAL LOW (ref 14–54)
AST: 24 U/L (ref 15–41)
Anion gap: 12 (ref 5–15)
BILIRUBIN TOTAL: 0.8 mg/dL (ref 0.3–1.2)
BUN: 12 mg/dL (ref 6–20)
CO2: 18 mmol/L — ABNORMAL LOW (ref 22–32)
CREATININE: 0.56 mg/dL (ref 0.44–1.00)
Calcium: 9.2 mg/dL (ref 8.9–10.3)
Chloride: 107 mmol/L (ref 101–111)
GFR calc Af Amer: >60 mL/min (ref >60)
GLUCOSE: 147 mg/dL — AB (ref 65–99)
Potassium: 4.1 mmol/L (ref 3.5–5.1)
Sodium: 137 mmol/L (ref 135–145)
TOTAL PROTEIN: 6.7 g/dL (ref 6.5–8.1)

## 2017-10-02 LAB — CBC
HEMATOCRIT: 39.6 % (ref 35.0–47.0)
HEMOGLOBIN: 13.3 g/dL (ref 12.0–16.0)
MCH: 27.9 pg (ref 26.0–34.0)
MCHC: 33.6 g/dL (ref 32.0–36.0)
MCV: 83.1 fL (ref 80.0–100.0)
Platelets: 206 10*3/uL (ref 150–440)
RBC: 4.77 MIL/uL (ref 3.80–5.20)
RDW: 14.4 % (ref 11.5–14.5)
WBC: 15.2 10*3/uL — AB (ref 3.6–11.0)

## 2017-10-02 LAB — GLUCOSE, CAPILLARY
GLUCOSE-CAPILLARY: 99 mg/dL (ref 65–99)
Glucose-Capillary: 124 mg/dL — ABNORMAL HIGH (ref 65–99)
Glucose-Capillary: 85 mg/dL (ref 65–99)
Glucose-Capillary: 87 mg/dL (ref 65–99)

## 2017-10-02 LAB — PROTEIN / CREATININE RATIO, URINE
CREATININE, URINE: 186 mg/dL
Protein Creatinine Ratio: 0.45 mg/mg{Cre} — ABNORMAL HIGH (ref 0.00–0.15)
Total Protein, Urine: 84 mg/dL

## 2017-10-02 LAB — TSH: TSH: 1.289 u[IU]/mL (ref 0.350–4.500)

## 2017-10-02 MED ORDER — ACETAMINOPHEN 325 MG PO TABS
650.0000 mg | ORAL_TABLET | ORAL | Status: DC | PRN
  Filled 2017-10-02: qty 2

## 2017-10-02 MED ORDER — DEXTROSE IN LACTATED RINGERS 5 % IV SOLN
INTRAVENOUS | Status: DC
  Administered 2017-10-02 – 2017-10-04 (×3): via INTRAVENOUS

## 2017-10-02 MED ORDER — BETAMETHASONE SOD PHOS & ACET 6 (3-3) MG/ML IJ SUSP
12.0000 mg | INTRAMUSCULAR | Status: AC
  Administered 2017-10-02 – 2017-10-03 (×2): 12 mg via INTRAMUSCULAR
  Filled 2017-10-02: qty 2

## 2017-10-02 MED ORDER — HEPARIN SODIUM (PORCINE) 5000 UNIT/ML IJ SOLN
5000.0000 [IU] | Freq: Two times a day (BID) | INTRAMUSCULAR | Status: DC
  Administered 2017-10-02 – 2017-10-04 (×4): 5000 [IU] via SUBCUTANEOUS
  Filled 2017-10-02 (×6): qty 1

## 2017-10-02 MED ORDER — SODIUM CHLORIDE FLUSH 0.9 % IV SOLN
INTRAVENOUS | Status: AC
  Filled 2017-10-02: qty 10

## 2017-10-02 MED ORDER — ONDANSETRON HCL 4 MG/2ML IJ SOLN
4.0000 mg | Freq: Four times a day (QID) | INTRAMUSCULAR | Status: DC | PRN
  Administered 2017-10-02: 4 mg via INTRAVENOUS
  Filled 2017-10-02 (×2): qty 2

## 2017-10-02 MED ORDER — INSULIN ASPART 100 UNIT/ML ~~LOC~~ SOLN
0.0000 [IU] | SUBCUTANEOUS | Status: DC
  Administered 2017-10-03 (×4): 2 [IU] via SUBCUTANEOUS
  Administered 2017-10-03: 3 [IU] via SUBCUTANEOUS
  Administered 2017-10-03 (×2): 2 [IU] via SUBCUTANEOUS
  Administered 2017-10-04: 3 [IU] via SUBCUTANEOUS
  Administered 2017-10-04: 2 [IU] via SUBCUTANEOUS
  Filled 2017-10-02 (×9): qty 1

## 2017-10-02 MED ORDER — PROMETHAZINE HCL 25 MG/ML IJ SOLN
12.5000 mg | Freq: Four times a day (QID) | INTRAMUSCULAR | Status: DC | PRN
  Administered 2017-10-02: 12.5 mg via INTRAVENOUS
  Filled 2017-10-02 (×2): qty 1

## 2017-10-02 MED ORDER — COMPLETENATE 29-1 MG PO CHEW
1.0000 | CHEWABLE_TABLET | Freq: Every day | ORAL | Status: DC
  Filled 2017-10-02: qty 1

## 2017-10-02 MED ORDER — INSULIN ASPART 100 UNIT/ML ~~LOC~~ SOLN
0.0000 [IU] | Freq: Three times a day (TID) | SUBCUTANEOUS | Status: DC
  Filled 2017-10-02 (×9): qty 0.15

## 2017-10-02 MED ORDER — LACTATED RINGERS IV SOLN
INTRAVENOUS | Status: DC
  Administered 2017-10-02: 08:00:00 via INTRAVENOUS

## 2017-10-02 MED ORDER — PANTOPRAZOLE SODIUM 40 MG PO TBEC
80.0000 mg | DELAYED_RELEASE_TABLET | Freq: Every day | ORAL | Status: DC
  Administered 2017-10-03: 80 mg via ORAL
  Filled 2017-10-02 (×4): qty 2

## 2017-10-02 MED ORDER — LACTATED RINGERS IV BOLUS (SEPSIS): 500.0000 mL | Freq: Once | INTRAVENOUS | Status: DC

## 2017-10-02 MED ORDER — PROMETHAZINE HCL 25 MG PO TABS
25.0000 mg | ORAL_TABLET | Freq: Four times a day (QID) | ORAL | Status: DC | PRN
  Filled 2017-10-02: qty 1

## 2017-10-02 NOTE — Progress Notes (Signed)
Blood sugar taken at 1106 did not cross over its was 121

## 2017-10-02 NOTE — OB Triage Note (Signed)
Pt here for elevated blood pressures, facial swelling, nausea and vomiting "something just isnt right" slight headache she rates as a 2, discomfort top abdomen more to left side.

## 2017-10-02 NOTE — H&P (Signed)
Obstetric H&P   Chief Complaint: Nausea and vomiting, elevated blood pressure  Prenatal Care Provider: [facility]  History of Present Illness: 32 y.o. [id number] [redacted]w[redacted]d by 11/13/2017, by Last Menstrual Period presenting to L&D with nausea and vomiting since 0300 this morning and multiple elevated blood pressures (160s-170s/90s-100s) taken at home. She awoke feeling nauseated and vomited five times at home. She has a mild headache (2/10). She has not taken any medication for her headache and nothing makes it better or worse. She is having some mild intermittent upper abdominal pain on the left side that she feels as a tightening sensation. She has facial edema that has worsened overnight. She denies visual changes and blurred vision, epigastric pain, vaginal bleeding, and loss of fluid. She endorses good fetal movement.  Pregravid weight 222 lb (100.7 kg) Total Weight Gain 30 lb (13.6 kg)  pregnancy#4 Problems (from 02/06/17 to present)    Problem Noted Resolved   Gestational diabetes 09/15/2017 by Malachy Mood, MD No   Overview Addendum 09/26/2017 11:26 AM by Will Bonnet, MD    Current Diabetic Medications:  Glyburide (initially 2.5mg  on 1/10, increased to 5mg  HS on 1/21) [x]  Aspirin 81 mg daily after 12 weeks; discontinue after 36 weeks (? A2/B GDM)  Required Referrals for A1GDM or A2GDM: [x]  Diabetes Education and Testing Supplies [x]  Nutrition Cousult  For A2/B GDM or higher classes of DM [x]  Diabetes Education and Testing Supplies [x]  Nutrition Counsult [ ]  Fetal ECHO after 22-24 weeks  [ ]  Eye exam for retina evaluation  [x]  Baseline EKG [x]  US fetal growth every 4 weeks starting at 28 weeks [x]  Twice weekly NST starting at [redacted] weeks gestation [ ]  Delivery planning contingent on fetal growth, AFI, glycemic control, and other co-morbidities but at least by 39 weeks  Baseline and surveillance labs (pulled in from [facility], refresh links as needed)  Lab Results  Component Value Date    CREATININE 0.57 06/02/2017   AST 8 04/11/2017   ALT 6 04/11/2017   TSH 0.931 08/25/2017   PROTCRRATIO 0.16 (H) 08/11/2017   No results found for: HGBA1C  Antenatal Testing Class of DM U/S NST/AFI DELIVERY  Diabetes   A1 - good control - O24.410    A2 - good control - O24.419      A2  - poor control or poor compliance - O24.419, E11.65   (Macrosomia or polyhydramnios) **E11.65 is extra code for poor control**    A2/B - O24.919  and B-C O24.319  Poor control B-C or D-R-F-T - O24.319  or  Type I DM - O24.019  20-38  20-38  20-24-28-32-36   20-24-28-32-35-38//fetal echo  20-24-27-30-33-36-38//fetal echo  40  32//2 x wk  32//2 x wk   32//2 x wk  28//BPP wkly then 32//2 x wk  40  39  PRN   39  PRN          Elevated glucose tolerance test 08/26/2017 by Malachy Mood, MD No   Overview Signed 08/26/2017  1:06 PM by Malachy Mood, MD    185 on 1-hr      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 by Malachy Mood, MD No   Overview Addendum 07/13/2017  5:07 PM by Malachy Mood, MD    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 [facility]      Maternal obesity, antepartum 04/12/2017 by Malachy Mood, MD No   Overview Signed 04/12/2017  8:53 PM by Malachy Mood, MD    BMI >=40 [ ]  early 1h gtt -  [ ]   u/s for dating [ ]   [ ]  nutritional goals [ ]  folic acid 1mg  [ ]  bASA (>12 weeks) [ ]  consider nutrition consult [ ]  consider maternal EKG 1st trimester [ ]  Growth u/s 28 [ ] , 32 [ ] , 36 weeks [ ]  [ ]  NST/AFI weekly 36+ weeks (36[] , 37[] , 38[] , 39[] , 40[] ) [ ]  IOL by 41 weeks (scheduled, prn [] )       High-risk pregnancy supervision, unspecified trimester 04/12/2017 by Malachy Mood, MD No   Overview Addendum 09/29/2017  2:29 PM by Gae Dry, MD    Clinic [facility] Prenatal Labs  Dating [place] Blood type: A positive  Genetic Screen 1 Screen:    AFP:     Quad:     NIPS: Antibody:Negative  Anatomic [place] WS Rubella: Immune Varicella: Immune  GTT  Early:129    Third trimester:185 3-hr: 133 / 235 / 212 / 148 RPR: NR  Rhogam N/A HBsAg: Negative  TDaP vaccine      09/14/17    Flu Shot: UTD HIV: Negative  Baby Food          BREAST                      GBS:   Contraception  Pap: NIL HPV negative 04/11/17  CBB   Baseline P/C ratio 169mg /dL  CS/VBAC         CS   Support Person               Hx of preeclampsia, prior pregnancy, currently pregnant, unspecified trimester 04/11/2017 by Malachy Mood, MD No   Overview Addendum 04/14/2017  1:06 PM by Malachy Mood, MD    [X]  Baseline P/C ratio 169mg /dL [ ]  Aspirin 81 mg daily after 12 weeks; discontinue after 36 weeks   Lab Results  Component Value Date   PLT 272 04/11/2017   CREATININE 0.64 04/11/2017   AST 8 04/11/2017   ALT 6 04/11/2017          History of cesarean section complicating pregnancy 10/11/2351 by Malachy Mood, MD No   Overview Signed 04/12/2017  8:52 PM by Malachy Mood, MD    Desires repeat      History of gestational diabetes in prior pregnancy, currently pregnant 04/11/2017 by Malachy Mood, MD No   Overview Signed 04/12/2017  8:52 PM by Malachy Mood, MD    HgbA1C at NOB, early 1-hr ordered      Short interval between pregnancies affecting pregnancy, antepartum 04/11/2017 by Malachy Mood, MD No   DEPRESSION 07/06/2010 by Owens Loffler, MD No   Overview Signed 10/14/2010  6:18 PM by Interface, Problem List In    Qualifier: Diagnosis of  By: Copland MD, Spencer            Review of Systems:  Review of Systems - General ROS: negative for - chills or fever Psychological ROS: negative ENT ROS: negative for - visual changes Hematological and Lymphatic ROS: negative Endocrine ROS: negative Respiratory ROS: no cough, shortness of breath, or wheezing Cardiovascular ROS: no chest pain or dyspnea on exertion Gastrointestinal ROS: no abdominal pain, change in bowel habits, or black or bloody stools positive for -  nausea/vomiting Genito-Urinary ROS: no dysuria, trouble voiding, or hematuria Musculoskeletal ROS: negative for - joint swelling or swelling in extremities Dermatological ROS: negative   Past Medical History: Past Medical History:  Diagnosis Date  . Allergy   . Gestational diabetes   . History of chicken pox   . Hypertension   .  Tachycardia     Past Surgical History: Past Surgical History:  Procedure Laterality Date  . CESAREAN SECTION  11/26/11  . CESAREAN SECTION N/A 04/01/2016   Procedure: CESAREAN SECTION;  Surgeon: Gae Dry, MD;  Location: [facility] ORS;  Service: Obstetrics;  Laterality: N/A;  Time of birth 18:50 Sex: Female Weight: 7 lbs, 13 oz   . KNEE SURGERY  2005  . LARYNX SURGERY    . TONSILLECTOMY      Past Obstetric History: [id number]  Past Gynecologic History: none  Family History: Denies Gyn cancers in her family  Social History: Social History   Socioeconomic History  . Marital status: Married    Spouse name: Not on file  . Number of children: Not on file  . Years of education: Not on file  . Highest education level: Not on file  Social Needs  . Financial resource strain: Not on file  . Food insecurity - worry: Not on file  . Food insecurity - inability: Not on file  . Transportation needs - medical: Not on file  . Transportation needs - non-medical: Not on file  Occupational History  . Occupation: cna    Employer: [employer]  Tobacco Use  . Smoking status: Never Smoker  . Smokeless tobacco: Never Used  Substance and Sexual Activity  . Alcohol use: No    Alcohol/week: 0.0 oz  . Drug use: No  . Sexual activity: Yes  Other Topics Concern  . Not on file  Social History Narrative   Regular exercise--yes    Medications: Prior to Admission medications   Medication Sig Start Date End Date Taking? Authorizing Provider  aspirin 81 MG chewable tablet Chew by mouth daily.   Yes [provider]  glyBURIDE (DIABETA) 2.5 MG tablet Take 1  tablet (2.5 mg total) by mouth daily with supper. 09/15/17  Yes Malachy Mood, MD  omeprazole (PRILOSEC) 40 MG capsule Take 1 capsule (40 mg total) daily by mouth. 07/26/17 07/26/18 Yes Malachy Mood, MD  Prenatal Vit-Fe Fumarate-FA (MULTIVITAMIN-PRENATAL) 27-0.8 MG TABS Take 1 tablet by mouth daily. Reported on 10/20/2015   Yes [provider]  albuterol (PROVENTIL) (2.5 MG/3ML) 0.083% nebulizer solution Take 3 mLs (2.5 mg total) by nebulization every 6 (six) hours as needed for wheezing or shortness of breath. Patient not taking: Reported on 10/02/2017 05/29/17   Darel Hong, MD  fluticasone [facility]) 50 MCG/ACT nasal spray Place 2 sprays into both nostrils daily. 08/23/17   Johnn Hai, PA-C  Spacer/Aero Chamber Mouthpiece MISC 1 Units by Does not apply route every 4 (four) hours as needed (wheezing). Patient not taking: Reported on 08/11/2017 05/29/17   Darel Hong, MD  Spacer/Aero-Holding Josiah Lobo ([place]) [place]  05/30/17   [provider]    Allergies: Allergies  Allergen Reactions  . Sulfa Antibiotics Hives  . Sulfonamide Derivatives Hives    Physical Exam: Vitals: Blood pressure 131/82, pulse (!) 123, temperature 98.1 F (36.7 C), temperature source Oral, last menstrual period 02/06/2017.   FHT: Baseline 145, moderate variability, accelerations present, decelerations absent Toco: occasional contractions  General: NAD HEENT: normocephalic, anicteric Pulmonary: No increased work of breathing, CTAB Cardiovascular: RRR, distal pulses 2+ Abdomen: Gravid, mild tenderness on left side Genitourinary: deferred Extremities: trace edema, no erythema or tenderness Neurologic: Grossly intact, no clonus Psychiatric: mood appropriate, affect full  Labs: Results for orders placed or performed during the hospital encounter of 10/02/17 (from the past 24 hour(s))  CBC     Status: Abnormal  Collection Time: 10/02/17  7:43 AM  Result Value Ref  Range   WBC 15.2 (H) 3.6 - 11.0 K/uL   RBC 4.77 3.80 - 5.20 MIL/uL   Hemoglobin 13.3 12.0 - 16.0 g/dL   HCT 39.6 35.0 - 47.0 %   MCV 83.1 80.0 - 100.0 fL   MCH 27.9 26.0 - 34.0 pg   MCHC 33.6 32.0 - 36.0 g/dL   RDW 14.4 11.5 - 14.5 %   Platelets 206 150 - 440 K/uL  Comprehensive metabolic panel     Status: Abnormal   Collection Time: 10/02/17  7:43 AM  Result Value Ref Range   Sodium 137 135 - 145 mmol/L   Potassium 4.1 3.5 - 5.1 mmol/L   Chloride 107 101 - 111 mmol/L   CO2 18 (L) 22 - 32 mmol/L   Glucose, Bld 147 (H) 65 - 99 mg/dL   BUN 12 6 - 20 mg/dL   Creatinine, Ser 0.56 0.44 - 1.00 mg/dL   Calcium 9.2 8.9 - 10.3 mg/dL   Total Protein 6.7 6.5 - 8.1 g/dL   Albumin 3.3 (L) 3.5 - 5.0 g/dL   AST 24 15 - 41 U/L   ALT 12 (L) 14 - 54 U/L   Alkaline Phosphatase 96 38 - 126 U/L   Total Bilirubin 0.8 0.3 - 1.2 mg/dL   GFR calc non Af Amer >60 >60 mL/min   GFR calc Af Amer >60 >60 mL/min   Anion gap 12 5 - 15  Protein / creatinine ratio, urine     Status: Abnormal   Collection Time: 10/02/17  7:43 AM  Result Value Ref Range   Creatinine, Urine 186 mg/dL   Total Protein, Urine 84 mg/dL   Protein Creatinine Ratio 0.45 (H) 0.00 - 0.15 mg/mg[Cre]  TSH     Status: None   Collection Time: 10/02/17  7:43 AM  Result Value Ref Range   TSH 1.289 0.350 - 4.500 uIU/mL  Glucose, capillary     Status: None   Collection Time: 10/02/17  1:20 PM  Result Value Ref Range   Glucose-Capillary 87 65 - 99 mg/dL    Assessment: 32 y.o. [id number] [redacted]w[redacted]d by 11/13/2017, by Last Menstrual Period with nausea and vomiting, elevated blood pressures.  Plan: 1) Monitor blood pressures and treat as needed. Pre-eclampsia labs. Check BG. Anti-emetic.  2) Fetus - Category I tracing, FWB reassuring, continue to monitor.  3) PNL - Blood type A/Positive/-- (08/06 0000) / Anti-bodyscreen Negative (08/06 0000) / Rubella 11.50 (08/06 0000) / Varicella immune / RPR Non Reactive (12/20 1525) / HBsAg Negative (08/06  0000) / HIV Non Reactive (12/20 1525)   4) Immunization History -  Immunization History  Administered Date(s) Administered  . Tdap 09/14/2017    5) Disposition - Admit for observation and continue to monitor blood pressures, blood glucose, symptoms, and labs.  Avel Sensor, CNM 10/02/2017  3:30 PM   ADDENDUM: 32 y.o. [id number] female presents, as above, with several hours of nausea/vomiting with elevated blood pressures on home machine with systolic BPs to 585I.  She noted a mild headache on presentation, which has resolved.  Notably, her husband and son also began having nausea/vomiting this morning. She has noted a slow increase of her blood pressures since Thursday (3 days ago) with diastolic BPs in the 77O (slightly higher than seen in clinic). She is not currently taking antihypertensive medication.  Her headache resolved quickly after presentation, she has no visual changes, and on RUQ pain.  She has  noted +FM, no LOF, and no VB.  Notes occasional tightness in her abdomen upon presentation, which has resolved. She has been monitored for over 12 hours at the time of this addendum. She had normal preeclampsia labs, apart from a newly elevated urine protein-to-creatinine ratio of 0.45, up from baseline of 0.16 earlier in the pregnancy.  She has had several severe-range blood pressures here, but upon recheck they are normal or not in severe range.  She has had a predominance of 140s-150s SBP and some occasional 100's DBP.   Her most severe blood pressures seen here have been mostly timed to when she was nauseated.  Since her nausea has improved, her blood pressures have improved, as well.  Even still, it appears she may likely have a superimposed preeclampsia with new proteinuria with slowly-increasing blood pressures.  Plan: 1) superimposed preeclampsia: - BMTZ x2 doses - will not start antihypertensives now as the threshold to start medication is persistent SBPs >160 or DBPs > 110.  Given her  GDM, I would probably have a lower threshold to start her an on antihypertensive.  At the time of this note, her BPs are quite reasonable and many have been in the normal range.   - 24h Urine total protein to verify new proteinuria - normal CXR - recent ultrasounds for growth and AFI completed - no evidence of HELLP or end organ damage  2) GDM: since she is receiving BMTZ and her BG levels are not controlled overall (recent dose adjustment to glyburide this past week), will monitor in-house for BP stability and BG stability. - SSI (medium scale) Q 4 hours - Carb modified diet - POCT CBG have been normal here. - D5LR IV fluid  3) Fetal well being:  - PNV - category 1 tracing, with only 1 episode of a late deceleration that was short in duration with return to baseline with moderate variability and accelerations.   - Intermittent fetal monitoring overnight (has been on continuous for > 14 hours) - reassuring at this time  4) tachycardia:  - EKG shows sinus tachycardia. Known issue and asymptomatic - electrolytes normal - monitor - if needs antihypertensive, will use labetalol  5) nausea/emesis: - improved.   - zofran/phenergan prn - likely a viral gastroenteritis vs food poisoning (given most of her family at home had similar symptoms).  6) Prophylaxis - SCDs - heparin 5,000 units bid - may ambulate - PRN pepcid, if heartburn develops.  7) FEN/GI:  - D5LR @ 100 ml/hr - carb modified diet.  - decrease IVF once taking in more PO  8) dispo: will need inpatient admission given new-onset superimposed preeclampsia. Assess for need for antihypertensives, medication changes for BG control for GDM (given she is receiving BMTZ and this will elevate her BG values), need for delivery.    I have been involved with every aspect of this patient's care since the initial evaluation by Miguel Rota, CNM.  I have spoken to the patient at length about the overall plan of management and my  rationale, with the risks and benefits of my plan of care.  All her questions have been answered. She agrees to proceed with the plan as outlined.  Prentice Docker, MD 10/02/2017 10:04 PM

## 2017-10-02 NOTE — Progress Notes (Signed)
Discussed plan of care with Dr. Glennon Mac. Orders received to discontinue EFM and perform NST q 4 hours.

## 2017-10-02 NOTE — Plan of Care (Signed)
Discussed plan of care with patient. Patient verbalized understanding and agreed to plan.

## 2017-10-03 ENCOUNTER — Encounter: Payer: 59 | Admitting: Obstetrics & Gynecology

## 2017-10-03 DIAGNOSIS — O113 Pre-existing hypertension with pre-eclampsia, third trimester: Secondary | ICD-10-CM

## 2017-10-03 DIAGNOSIS — O10013 Pre-existing essential hypertension complicating pregnancy, third trimester: Secondary | ICD-10-CM

## 2017-10-03 DIAGNOSIS — Z3A34 34 weeks gestation of pregnancy: Secondary | ICD-10-CM

## 2017-10-03 LAB — GLUCOSE, CAPILLARY
GLUCOSE-CAPILLARY: 135 mg/dL — AB (ref 65–99)
GLUCOSE-CAPILLARY: 141 mg/dL — AB (ref 65–99)
Glucose-Capillary: 149 mg/dL — ABNORMAL HIGH (ref 65–99)
Glucose-Capillary: 158 mg/dL — ABNORMAL HIGH (ref 65–99)
Glucose-Capillary: 130 mg/dL — ABNORMAL HIGH (ref 65–99)
Glucose-Capillary: 128 mg/dL — ABNORMAL HIGH (ref 65–99)

## 2017-10-03 LAB — CBC
HEMATOCRIT: 35.6 % (ref 35.0–47.0)
HEMOGLOBIN: 12.1 g/dL (ref 12.0–16.0)
MCH: 28.5 pg (ref 26.0–34.0)
MCHC: 33.9 g/dL (ref 32.0–36.0)
MCV: 84.1 fL (ref 80.0–100.0)
Platelets: 195 10*3/uL (ref 150–440)
RBC: 4.23 MIL/uL (ref 3.80–5.20)
RDW: 14.1 % (ref 11.5–14.5)
WBC: 9.3 10*3/uL (ref 3.6–11.0)

## 2017-10-03 LAB — TYPE AND SCREEN
ABO/RH(D): A POS
Antibody Screen: NEGATIVE

## 2017-10-03 LAB — COMPREHENSIVE METABOLIC PANEL
ALBUMIN: 2.9 g/dL — AB (ref 3.5–5.0)
ALT: 14 U/L (ref 14–54)
AST: 24 U/L (ref 15–41)
Alkaline Phosphatase: 83 U/L (ref 38–126)
Anion gap: 9 (ref 5–15)
BILIRUBIN TOTAL: 1.2 mg/dL (ref 0.3–1.2)
BUN: 6 mg/dL (ref 6–20)
CALCIUM: 8.9 mg/dL (ref 8.9–10.3)
CO2: 20 mmol/L — ABNORMAL LOW (ref 22–32)
Chloride: 107 mmol/L (ref 101–111)
Creatinine, Ser: 0.58 mg/dL (ref 0.44–1.00)
GFR calc Af Amer: >60 mL/min (ref >60)
GLUCOSE: 164 mg/dL — AB (ref 65–99)
POTASSIUM: 4 mmol/L (ref 3.5–5.1)
Sodium: 136 mmol/L (ref 135–145)
TOTAL PROTEIN: 5.9 g/dL — AB (ref 6.5–8.1)

## 2017-10-03 MED ORDER — PRENATAL MULTIVITAMIN CH
1.0000 | ORAL_TABLET | Freq: Every day | ORAL | Status: DC
  Administered 2017-10-03: 1 via ORAL
  Filled 2017-10-03: qty 1

## 2017-10-03 MED ORDER — PRENATAL MULTIVITAMIN CH
1.0000 | ORAL_TABLET | Freq: Every day | ORAL | Status: DC
  Filled 2017-10-03 (×4): qty 1

## 2017-10-03 MED ORDER — DOCUSATE SODIUM 100 MG PO CAPS
100.0000 mg | ORAL_CAPSULE | Freq: Every day | ORAL | Status: DC
  Administered 2017-10-04: 100 mg via ORAL
  Filled 2017-10-03 (×2): qty 1

## 2017-10-03 MED ORDER — CALCIUM CARBONATE ANTACID 500 MG PO CHEW
2.0000 | CHEWABLE_TABLET | ORAL | Status: DC | PRN
  Filled 2017-10-03: qty 2

## 2017-10-03 MED ORDER — INSULIN DETEMIR 100 UNIT/ML ~~LOC~~ SOLN
5.0000 [IU] | Freq: Two times a day (BID) | SUBCUTANEOUS | Status: DC
  Administered 2017-10-03 – 2017-10-04 (×3): 5 [IU] via SUBCUTANEOUS
  Filled 2017-10-03 (×4): qty 0.05

## 2017-10-03 MED ORDER — ACETAMINOPHEN 325 MG PO TABS
650.0000 mg | ORAL_TABLET | ORAL | Status: DC | PRN
  Filled 2017-10-03: qty 2

## 2017-10-03 NOTE — Progress Notes (Signed)
Patient was sitting up in bed and reading her book this morning when I visited her. She is feeling much better today than yesterday. She ate her breakfast and has not had any nausea or vomiting since yesterday. She also feels better following rehydration. Her 24 hour urine is in progress and had to be restarted at 6 AM after some of it was discarded. There is a new order for Levimir 5 units BID for blood sugar control that is explained to the patient. Per Dr Gilman Schmidt she will stay in patient until her 24 hour urine results. She denies headache, visual changes, epigastric pain, SOB, N/V. She denies contractions or leakage of fluid/vaginal bleeding. She reports good fetal movement. The patient will be transferred to Mother/Baby when a room is available.  Vital Signs: BP (!) 141/85 (BP Location: Left Arm)   Pulse (!) 115   Temp (!) 97.5 F (36.4 C) (Oral)   Resp 18   Ht 5\' 7"  (1.702 m)   Wt 252 lb (114.3 kg)   LMP 02/06/2017 (Exact Date)   BMI 39.47 kg/m  Constitutional: Well nourished, well developed female in no acute distress.  HEENT: normal Skin: Warm and dry.  Cardiovascular: Regular rate and rhythm.   Extremity: no evidence of DVT  Respiratory: Clear to auscultation bilateral. Normal respiratory effort Abdomen: FHT present Psych: Alert and Oriented x3. No memory deficits. Normal mood and affect.   Assessment/Plan 32 yo [id number] female with IUP at [redacted]w[redacted]d inpatient initially for nausea and vomiting. She has Benign essential hypertension, history of preeclampsia, history of gestational diabetes, short interval between pregnancies, obesity, airway stricture with surgery, gestational diabetes A2 on her problem list.  Inpatient on Mother Baby unit awaiting results of 24 hour urine Doppler fetal heart tones q shift Regular diet  Rod Can, CNM

## 2017-10-04 ENCOUNTER — Other Ambulatory Visit: Payer: Self-pay | Admitting: Obstetrics and Gynecology

## 2017-10-04 DIAGNOSIS — O10013 Pre-existing essential hypertension complicating pregnancy, third trimester: Secondary | ICD-10-CM

## 2017-10-04 DIAGNOSIS — O113 Pre-existing hypertension with pre-eclampsia, third trimester: Secondary | ICD-10-CM

## 2017-10-04 DIAGNOSIS — Z3A34 34 weeks gestation of pregnancy: Secondary | ICD-10-CM

## 2017-10-04 LAB — PROTEIN, URINE, 24 HOUR
Collection Interval-UPROT: 24 hours
PROTEIN, URINE: 12 mg/dL
Protein, 24H Urine: 384 mg/d — ABNORMAL HIGH (ref 50–100)
Urine Total Volume-UPROT: 3200 mL

## 2017-10-04 LAB — GLUCOSE, CAPILLARY
GLUCOSE-CAPILLARY: 135 mg/dL — AB (ref 65–99)
Glucose-Capillary: 151 mg/dL — ABNORMAL HIGH (ref 65–99)

## 2017-10-04 MED ORDER — ONDANSETRON 4 MG PO TBDP: 4.0000 mg | ORAL_TABLET | Freq: Four times a day (QID) | ORAL | 0 refills | Status: DC | PRN

## 2017-10-04 MED ORDER — "INSULIN SYRINGE 29G X 1/2"" 0.5 ML MISC": 3 refills | Status: DC

## 2017-10-04 MED ORDER — INSULIN LISPRO 100 UNIT/ML ~~LOC~~ SOLN: 6.0000 [IU] | Freq: Three times a day (TID) | SUBCUTANEOUS | 3 refills | Status: DC

## 2017-10-04 MED ORDER — INSULIN GLARGINE 100 UNIT/ML ~~LOC~~ SOLN: 30.0000 [IU] | Freq: Every day | SUBCUTANEOUS | 11 refills | Status: DC

## 2017-10-04 NOTE — Discharge Summary (Signed)
Physician Discharge Summary  Patient ID: Mallory Pearson MRN: [id number] DOB/AGE: 01-31-1986 32 y.o.  Admit date: 10/02/2017 Discharge date: 10/04/2017  Admission Diagnoses:  1) Superimposed preeclampsia 2) [id number] at 66w2d3) Viral gastroenteritis 4) Gestational diabetes   Discharge Diagnoses:  Active Problems:   Gestational hypertension   Chronic hypertension with superimposed preeclampsia   Discharged Condition: stable  Hospital Course: Patient was admitted for nausea and malaise.  Other family member developed similar symptoms.  Symptoms improved with hydration and antiemetics.  BP's elevated on admission.  Labs stable other than elevation in P/C ratio and 24-hr urine making the diagnosis of superimposed preeclampsia.  Blood pressures improved and mostly normotensive to mild range at the time of discharge.  Asymptomatic as far as vision changes and headaches.  Given her concomitant tachycardia we discussed possibility of adding labetalol for beta blocking effect and rate control (has seen cardiology sinus tachycardia).  She did receive BMZ x 2 doses this admission.  She was also started on insulin and had her glyburide discontinued  Consults: None  Significant Diagnostic Studies:  Results for orders placed or performed during the hospital encounter of 10/02/17 (from the past 72 hour(s))  CBC     Status: Abnormal   Collection Time: 10/02/17  7:43 AM  Result Value Ref Range   WBC 15.2 (H) 3.6 - 11.0 K/uL   RBC 4.77 3.80 - 5.20 MIL/uL   Hemoglobin 13.3 12.0 - 16.0 g/dL   HCT 39.6 35.0 - 47.0 %   MCV 83.1 80.0 - 100.0 fL   MCH 27.9 26.0 - 34.0 pg   MCHC 33.6 32.0 - 36.0 g/dL   RDW 14.4 11.5 - 14.5 %   Platelets 206 150 - 440 K/uL    Comment: Performed at [facility] [address] [place] [address] Comprehensive metabolic panel     Status: Abnormal   Collection Time: 10/02/17  7:43 AM  Result Value Ref Range   Sodium 137 135 - 145 mmol/L   Potassium  4.1 3.5 - 5.1 mmol/L   Chloride 107 101 - 111 mmol/L   CO2 18 (L) 22 - 32 mmol/L   Glucose, Bld 147 (H) 65 - 99 mg/dL   BUN 12 6 - 20 mg/dL   Creatinine, Ser 0.56 0.44 - 1.00 mg/dL   Calcium 9.2 8.9 - 10.3 mg/dL   Total Protein 6.7 6.5 - 8.1 g/dL   Albumin 3.3 (L) 3.5 - 5.0 g/dL   AST 24 15 - 41 U/L   ALT 12 (L) 14 - 54 U/L   Alkaline Phosphatase 96 38 - 126 U/L   Total Bilirubin 0.8 0.3 - 1.2 mg/dL   GFR calc non Af Amer >60 >60 mL/min   GFR calc Af Amer >60 >60 mL/min    Comment: (NOTE) The eGFR has been calculated using the CKD EPI equation. This calculation has not been validated in all clinical situations. eGFR's persistently <60 mL/min signify possible Chronic Kidney Disease.    Anion gap 12 5 - 15    Comment: Performed at [facility] [address] [place] [address] Protein / creatinine ratio, urine     Status: Abnormal   Collection Time: 10/02/17  7:43 AM  Result Value Ref Range   Creatinine, Urine 186 mg/dL   Total Protein, Urine 84 mg/dL    Comment: NO NORMAL RANGE ESTABLISHED FOR THIS TEST   Protein Creatinine Ratio 0.45 (H) 0.00 - 0.15 mg/mg[Cre]    Comment:  Performed at [facility], [address] [place], [place] [address]  TSH     Status: None   Collection Time: 10/02/17  7:43 AM  Result Value Ref Range   TSH 1.289 0.350 - 4.500 uIU/mL    Comment: Performed by a 3rd Generation assay with a functional sensitivity of <=0.01 uIU/mL. Performed at [facility], [address] [place], [place] [address]   Type and screen [place]     Status: None   Collection Time: 10/02/17  7:43 AM  Result Value Ref Range   ABO/RH(D) A POS    Antibody Screen NEG    Sample Expiration      10/05/2017 Performed at [facility], [address] [place], [place] [address]   Glucose, capillary     Status: None   Collection Time: 10/02/17  1:20 PM  Result Value Ref Range   Glucose-Capillary 87 65 - 99  mg/dL  Glucose, capillary     Status: None   Collection Time: 10/02/17  5:12 PM  Result Value Ref Range   Glucose-Capillary 85 65 - 99 mg/dL  Glucose, capillary     Status: None   Collection Time: 10/02/17  7:36 PM  Result Value Ref Range   Glucose-Capillary 99 65 - 99 mg/dL  Glucose, capillary     Status: Abnormal   Collection Time: 10/02/17 11:40 PM  Result Value Ref Range   Glucose-Capillary 124 (H) 65 - 99 mg/dL  Glucose, capillary     Status: Abnormal   Collection Time: 10/03/17  3:38 AM  Result Value Ref Range   Glucose-Capillary 149 (H) 65 - 99 mg/dL  CBC     Status: None   Collection Time: 10/03/17  5:21 AM  Result Value Ref Range   WBC 9.3 3.6 - 11.0 K/uL   RBC 4.23 3.80 - 5.20 MIL/uL   Hemoglobin 12.1 12.0 - 16.0 g/dL   HCT 35.6 35.0 - 47.0 %   MCV 84.1 80.0 - 100.0 fL   MCH 28.5 26.0 - 34.0 pg   MCHC 33.9 32.0 - 36.0 g/dL   RDW 14.1 11.5 - 14.5 %   Platelets 195 150 - 440 K/uL    Comment: Performed at [facility], [address] [place], [place] [address]  Comprehensive metabolic panel     Status: Abnormal   Collection Time: 10/03/17  5:21 AM  Result Value Ref Range   Sodium 136 135 - 145 mmol/L   Potassium 4.0 3.5 - 5.1 mmol/L   Chloride 107 101 - 111 mmol/L   CO2 20 (L) 22 - 32 mmol/L   Glucose, Bld 164 (H) 65 - 99 mg/dL   BUN 6 6 - 20 mg/dL   Creatinine, Ser 0.58 0.44 - 1.00 mg/dL   Calcium 8.9 8.9 - 10.3 mg/dL   Total Protein 5.9 (L) 6.5 - 8.1 g/dL   Albumin 2.9 (L) 3.5 - 5.0 g/dL   AST 24 15 - 41 U/L   ALT 14 14 - 54 U/L   Alkaline Phosphatase 83 38 - 126 U/L   Total Bilirubin 1.2 0.3 - 1.2 mg/dL   GFR calc non Af Amer >60 >60 mL/min   GFR calc Af Amer >60 >60 mL/min    Comment: (NOTE) The eGFR has been calculated using the CKD EPI equation. This calculation has not been validated in all clinical situations. eGFR's persistently <60 mL/min signify possible Chronic Kidney Disease.    Anion gap 9 5 - 15  Comment: Performed at [facility], [address] [place], [place] [address]  Glucose, capillary     Status: Abnormal   Collection Time: 10/03/17  7:30 AM  Result Value Ref Range   Glucose-Capillary 158 (H) 65 - 99 mg/dL  Glucose, capillary     Status: Abnormal   Collection Time: 10/03/17 11:55 AM  Result Value Ref Range   Glucose-Capillary 135 (H) 65 - 99 mg/dL   Comment 1 Notify RN   Glucose, capillary     Status: Abnormal   Collection Time: 10/03/17  4:06 PM  Result Value Ref Range   Glucose-Capillary 130 (H) 65 - 99 mg/dL   Comment 1 Notify RN   Glucose, capillary     Status: Abnormal   Collection Time: 10/03/17  8:20 PM  Result Value Ref Range   Glucose-Capillary 128 (H) 65 - 99 mg/dL  Glucose, capillary     Status: Abnormal   Collection Time: 10/03/17 11:33 PM  Result Value Ref Range   Glucose-Capillary 141 (H) 65 - 99 mg/dL  Glucose, capillary     Status: Abnormal   Collection Time: 10/04/17  3:58 AM  Result Value Ref Range   Glucose-Capillary 151 (H) 65 - 99 mg/dL  Protein, urine, 24 hour     Status: Abnormal   Collection Time: 10/04/17  6:23 AM  Result Value Ref Range   Urine Total Volume-UPROT 3,200 mL   Collection Interval-UPROT 24 hours   Protein, Urine 12 mg/dL   Protein, 24H Urine 384 (H) 50 - 100 mg/day    Comment: Performed at [facility], [address] [place], [place] [address]  Glucose, capillary     Status: Abnormal   Collection Time: 10/04/17  7:52 AM  Result Value Ref Range   Glucose-Capillary 135 (H) 65 - 99 mg/dL     Treatments: IV hydration  Discharge Exam: Blood pressure 131/78, pulse 97, temperature 97.9 F (36.6 C), temperature source Oral, resp. rate 20, height '5\' 7"'  (1.702 m), weight 252 lb (114.3 kg), last menstrual period 02/06/2017, SpO2 99 %, unknown if currently breastfeeding. General appearance: alert and no distress Resp: normal respiratory effort Extremities: extremities normal, atraumatic, no cyanosis or edema Neurologic: Grossly  normal  Disposition: 01-Home or Self Care  Discharge Instructions    Discharge activity:  No Restrictions   Complete by:  As directed    Discharge diet:  No restrictions   Complete by:  As directed    Fetal Kick Count:  Lie on our left side for one hour after a meal, and count the number of times your baby kicks.  If it is less than 5 times, get up, move around and drink some juice.  Repeat the test 30 minutes later.  If it is still less than 5 kicks in an hour, notify your doctor.   Complete by:  As directed    LABOR:  When conractions begin, you should start to time them from the beginning of one contraction to the beginning  of the next.  When contractions are 5 - 10 minutes apart or less and have been regular for at least an hour, you should call your health care provider.   Complete by:  As directed    No sexual activity restrictions   Complete by:  As directed    Notify physician for bleeding from the vagina   Complete by:  As directed    Notify physician for blurring of vision or spots before the eyes  Complete by:  As directed    Notify physician for chills or fever   Complete by:  As directed    Notify physician for fainting spells, "black outs" or loss of consciousness   Complete by:  As directed    Notify physician for increase in vaginal discharge   Complete by:  As directed    Notify physician for leaking of fluid   Complete by:  As directed    Notify physician for pain or burning when urinating   Complete by:  As directed    Notify physician for pelvic pressure (sudden increase)   Complete by:  As directed    Notify physician for severe or continued nausea or vomiting   Complete by:  As directed    Notify physician for sudden gushing of fluid from the vagina (with or without continued leaking)   Complete by:  As directed    Notify physician for sudden, constant, or occasional abdominal pain   Complete by:  As directed    Notify physician if baby moving less than  usual   Complete by:  As directed      Allergies as of 10/04/2017      Reactions   Sulfa Antibiotics Hives   Sulfonamide Derivatives Hives      Medication List    STOP taking these medications   glyBURIDE 2.5 MG tablet Commonly known as:  DIABETA     TAKE these medications   albuterol (2.5 MG/3ML) 0.083% nebulizer solution Commonly known as:  PROVENTIL Take 3 mLs (2.5 mg total) by nebulization every 6 (six) hours as needed for wheezing or shortness of breath.   aspirin 81 MG chewable tablet Chew by mouth daily.   fluticasone 50 MCG/ACT nasal spray Commonly known as:  FLONASE Place 2 sprays into both nostrils daily.   insulin glargine 100 UNIT/ML injection Commonly known as:  LANTUS Inject 0.3 mLs (30 Units total) into the skin at bedtime.   insulin lispro 100 UNIT/ML injection Commonly known as:  HUMALOG Inject 0.06 mLs (6 Units total) into the skin 3 (three) times daily before meals.   INSULIN SYRINGE .5CC/29G 29G X 1/2" 0.5 ML Misc Use as directed   multivitamin-prenatal 27-0.8 MG Tabs tablet Take 1 tablet by mouth daily. Reported on 10/20/2015   omeprazole 40 MG capsule Commonly known as:  PRILOSEC Take 1 capsule (40 mg total) daily by mouth.   optichamber diamond Misc   Spacer/Aero Chamber Mouthpiece Misc 1 Units by Does not apply route every 4 (four) hours as needed (wheezing).        Signed: Malachy Mood 10/04/2017, 9:58 AM

## 2017-10-04 NOTE — Progress Notes (Signed)
Patient discharged home. Discharge instructions, prescriptions and follow up appointment given to and reviewed with patient. Patient verbalized understanding. Escorted out via wheelchair by auxiliary.

## 2017-10-05 ENCOUNTER — Other Ambulatory Visit: Payer: Self-pay | Admitting: *Deleted

## 2017-10-05 NOTE — Patient Outreach (Signed)
Received referral on 10/05/17 to contact this member to complete transition of care outreach and assessment of needs. Jaice was admitted on 10/02/17 to [facility] after she presented to the labor and delivery unit with symptoms of nausea, vomiting and elevated blood pressure per measurements taken at home. She is in her 34th week of pregnancy and also has gestational diabetes and was on oral medication, glyburide. She was discharged to home on Lantus and Humalog insulin and her glyburide was stopped. All other medications were the same as prior to admission. Attempted to reach [facility] via the only contact number listed, [phone]; left voice message requesting a return call. Will attempt again tomorrow if she does not return call today. Barrington Ellison RN,CCM,CDE [place] Management Coordinator [facility] Clinical Team Office Phone [phone] Office Fax [phone]

## 2017-10-06 ENCOUNTER — Other Ambulatory Visit: Payer: Self-pay | Admitting: *Deleted

## 2017-10-06 DIAGNOSIS — J386 Stenosis of larynx: Secondary | ICD-10-CM | POA: Diagnosis not present

## 2017-10-06 NOTE — Patient Outreach (Signed)
Received referral on 10/05/17 to contact this member to complete transition of care outreach and assessment of needs. Malay was admitted on 10/02/17 to [facility] after she presented to the labor and delivery unit with symptoms of nausea, vomiting and elevated blood pressure per measurements taken at home. She is in her 34th week of pregnancy and also has gestational diabetes and was on oral medication, glyburide. She was discharged to home on Lantus and Humalog insulin and her glyburide was stopped. All other medications were the same as prior to admission. Second attempt to reach [facility] via the only contact number listed, [phone]; left voice message requesting a return call. Per transition of care protocol, will attempt to reach member again tomorrow if she does not return call today. Barrington Ellison RN,CCM,CDE [place] Management Coordinator Office Phone [phone] Office Fax [phone]

## 2017-10-07 ENCOUNTER — Ambulatory Visit (INDEPENDENT_AMBULATORY_CARE_PROVIDER_SITE_OTHER): Payer: 59

## 2017-10-07 ENCOUNTER — Ambulatory Visit (INDEPENDENT_AMBULATORY_CARE_PROVIDER_SITE_OTHER): Payer: 59 | Admitting: Obstetrics and Gynecology

## 2017-10-07 ENCOUNTER — Encounter: Payer: Self-pay | Admitting: *Deleted

## 2017-10-07 ENCOUNTER — Other Ambulatory Visit: Payer: Self-pay | Admitting: *Deleted

## 2017-10-07 ENCOUNTER — Encounter: Payer: Self-pay | Admitting: Obstetrics and Gynecology

## 2017-10-07 VITALS — BP 150/92 | Wt 256.0 lb

## 2017-10-07 DIAGNOSIS — O99210 Obesity complicating pregnancy, unspecified trimester: Secondary | ICD-10-CM

## 2017-10-07 DIAGNOSIS — O0903 Supervision of pregnancy with history of infertility, third trimester: Secondary | ICD-10-CM | POA: Diagnosis not present

## 2017-10-07 DIAGNOSIS — O0990 Supervision of high risk pregnancy, unspecified, unspecified trimester: Secondary | ICD-10-CM | POA: Diagnosis not present

## 2017-10-07 DIAGNOSIS — O24414 Gestational diabetes mellitus in pregnancy, insulin controlled: Secondary | ICD-10-CM

## 2017-10-07 DIAGNOSIS — Z3A34 34 weeks gestation of pregnancy: Secondary | ICD-10-CM

## 2017-10-07 DIAGNOSIS — O09899 Supervision of other high risk pregnancies, unspecified trimester: Secondary | ICD-10-CM

## 2017-10-07 DIAGNOSIS — O119 Pre-existing hypertension with pre-eclampsia, unspecified trimester: Secondary | ICD-10-CM

## 2017-10-07 DIAGNOSIS — O09299 Supervision of pregnancy with other poor reproductive or obstetric history, unspecified trimester: Secondary | ICD-10-CM

## 2017-10-07 DIAGNOSIS — O34219 Maternal care for unspecified type scar from previous cesarean delivery: Secondary | ICD-10-CM

## 2017-10-07 DIAGNOSIS — Z8632 Personal history of gestational diabetes: Secondary | ICD-10-CM

## 2017-10-07 NOTE — Patient Outreach (Signed)
Received referral on 10/05/17 to contact this member to complete transition of care outreach and assessment of needs. Mallory Pearson was admitted on 10/02/17 to [facility] after she presented to the labor and delivery unit with symptoms of nausea, vomiting and elevated blood pressure per measurements taken at home. She is in her 34th week of pregnancy and also has gestational diabetes and was on oral medication, glyburide. She was discharged to home on Lantus and Humalog insulin and her glyburide was stopped. All other medications were the same as prior to admission. Per transition of care protocol, this is the third and final  attempt to reach [facility] via the only contact number listed, [phone]; left voice message advising Mallory Pearson this RNCM will mail [facility] CM services information to her home address and encouraged her to contact this RNCM should she desire services. Will close case to [facility] CM services.   Barrington Ellison RN,CCM,CDE [place] Management Coordinator [facility] Clinical Team Office Phone [phone] Office Fax [phone]

## 2017-10-07 NOTE — Progress Notes (Signed)
Routine Prenatal Care Visit  Subjective  Mallory Pearson is a 32 y.o. [id number] at [redacted]w[redacted]d being seen today for ongoing prenatal care.  She is currently monitored for the following issues for this high-risk pregnancy and has DEPRESSION; ALLERGIC RHINITIS, SEASONAL; Benign essential hypertension, antepartum; Hx of preeclampsia, prior pregnancy, currently pregnant, unspecified trimester; History of cesarean section complicating pregnancy; History of gestational diabetes in prior pregnancy, currently pregnant; Short interval between pregnancies affecting pregnancy, antepartum; Maternal obesity, antepartum; High-risk pregnancy supervision, unspecified trimester; Airway stricture; Elevated glucose tolerance test; Gestational diabetes; Gestational hypertension; and Chronic hypertension with superimposed preeclampsia on their problem list.  ----------------------------------------------------------------------------------- Patient reports mild headache today coming and going.  BPs at home 140s/90s.  Otherwise denies HA, visual changes and RUQ pain.  NOT TAKING INSULIN.  BG log today shows all normal values for past three days (since leaving hospital).   Contractions: Not present. Vag. Bleeding: None.  Movement: Present. Denies leaking of fluid.  ----------------------------------------------------------------------------------- The following portions of the patient's history were reviewed and updated as appropriate: allergies, current medications, past family history, past medical history, past social history, past surgical history and problem list. Problem list updated.  Objective  Blood pressure (!) 150/92, weight 256 lb (116.1 kg), last menstrual period 02/06/2017, unknown if currently breastfeeding. Pregravid weight 222 lb (100.7 kg) Total Weight Gain 34 lb (15.4 kg) Urinalysis:     Fetal Status: Fetal Heart Rate (bpm): 140   Movement: Present     General:  Alert, oriented and cooperative. Patient is in  no acute distress.  Skin: Skin is warm and dry. No rash noted.   Cardiovascular: Normal heart rate noted  Respiratory: Normal respiratory effort, no problems with respiration noted  Abdomen: Soft, gravid, appropriate for gestational age. Pain/Pressure: Absent     Pelvic:  Cervical exam deferred        Extremities: Normal range of motion.     Mental Status: Normal mood and affect. Normal behavior. Normal judgment and thought content.   NST Baseline FHR: 140 beats/min Variability: moderate Accelerations: present Decelerations: absent Tocometry: not done  Interpretation:  INDICATIONS: gestational diabetes mellitus and chronic hypertension RESULTS:  A NST procedure was performed with FHR monitoring and a normal baseline established, appropriate time of 20-40 minutes of evaluation, and accels >2 seen w 15x15 characteristics.  Results show a REACTIVE NST.     Assessment   32 y.o. [id number] at [redacted]w[redacted]d by  11/13/2017, by Last Menstrual Period presenting for routine prenatal visit  Plan   pregnancy#4 Problems (from 02/06/17 to present)    Problem Noted Resolved   Gestational diabetes 09/15/2017 by Malachy Mood, MD No   Overview Addendum 09/26/2017 11:26 AM by Will Bonnet, MD    Current Diabetic Medications:  Glyburide (initially 2.5mg  on 1/10, increased to 5mg  HS on 1/21) [x]  Aspirin 81 mg daily after 12 weeks; discontinue after 36 weeks (? A2/B GDM)  Required Referrals for A1GDM or A2GDM: [x]  Diabetes Education and Testing Supplies [x]  Nutrition Cousult  For A2/B GDM or higher classes of DM [x]  Diabetes Education and Testing Supplies [x]  Nutrition Counsult [ ]  Fetal ECHO after 22-24 weeks  [ ]  Eye exam for retina evaluation  [x]  Baseline EKG [x]  US fetal growth every 4 weeks starting at 28 weeks [x]  Twice weekly NST starting at [redacted] weeks gestation [ ]  Delivery planning contingent on fetal growth, AFI, glycemic control, and other co-morbidities but at least by 39  weeks  Baseline and surveillance labs (pulled in from [employer],  refresh links as needed)  Lab Results  Component Value Date   CREATININE 0.57 06/02/2017   AST 8 04/11/2017   ALT 6 04/11/2017   TSH 0.931 08/25/2017   PROTCRRATIO 0.16 (H) 08/11/2017   No results found for: HGBA1C  Antenatal Testing Class of DM U/S NST/AFI DELIVERY  Diabetes   A1 - good control - O24.410    A2 - good control - O24.419      A2  - poor control or poor compliance - O24.419, E11.65   (Macrosomia or polyhydramnios) **E11.65 is extra code for poor control**    A2/B - O24.919  and B-C O24.319  Poor control B-C or D-R-F-T - O24.319  or  Type I DM - O24.019  20-38  20-38  20-24-28-32-36   20-24-28-32-35-38//fetal echo  20-24-27-30-33-36-38//fetal echo  40  32//2 x wk  32//2 x wk   32//2 x wk  28//BPP wkly then 32//2 x wk  40  39  PRN   39  PRN          Elevated glucose tolerance test 08/26/2017 by Malachy Mood, MD No   Overview Signed 08/26/2017  1:06 PM by Malachy Mood, MD    185 on 1-hr      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 by Malachy Mood, MD No   Overview Addendum 07/13/2017  5:07 PM by Malachy Mood, MD    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 [facility]      Maternal obesity, antepartum 04/12/2017 by Malachy Mood, MD No   Overview Signed 04/12/2017  8:53 PM by Malachy Mood, MD    BMI >=40 [ ]  early 1h gtt -  [ ]  u/s for dating [ ]   [ ]  nutritional goals [ ]  folic acid 1mg  [ ]  bASA (>12 weeks) [ ]  consider nutrition consult [ ]  consider maternal EKG 1st trimester [ ]  Growth u/s 28 [ ] , 32 [ ] , 36 weeks [ ]  [ ]  NST/AFI weekly 36+ weeks (36[] , 37[] , 38[] , 39[] , 40[] ) [ ]  IOL by 41 weeks (scheduled, prn [] )       High-risk pregnancy supervision, unspecified trimester 04/12/2017 by Malachy Mood, MD No   Overview Addendum 09/29/2017  2:29 PM by Gae Dry, MD    Clinic [facility] Prenatal Labs  Dating [place] Blood type: A positive  Genetic  Screen 1 Screen:    AFP:     Quad:     NIPS: Antibody:Negative  Anatomic [place] WS Rubella: Immune Varicella: Immune  GTT Early:129    Third trimester:185 3-hr: 133 / 235 / 212 / 148 RPR: NR  Rhogam N/A HBsAg: Negative  TDaP vaccine      09/14/17    Flu Shot: UTD HIV: Negative  Baby Food          BREAST                      GBS:   Contraception  Pap: NIL HPV negative 04/11/17  CBB   Baseline P/C ratio 169mg /dL  CS/VBAC         CS   Support Person               Hx of preeclampsia, prior pregnancy, currently pregnant, unspecified trimester 04/11/2017 by Malachy Mood, MD No   Overview Addendum 04/14/2017  1:06 PM by Malachy Mood, MD    [X]  Baseline P/C ratio 169mg /dL [ ]  Aspirin 81 mg daily after 12 weeks; discontinue after 36 weeks   Lab Results  Component Value Date  PLT 272 04/11/2017   CREATININE 0.64 04/11/2017   AST 8 04/11/2017   ALT 6 04/11/2017          History of cesarean section complicating pregnancy 02/11/5915 by Malachy Mood, MD No   Overview Signed 04/12/2017  8:52 PM by Malachy Mood, MD    Desires repeat      History of gestational diabetes in prior pregnancy, currently pregnant 04/11/2017 by Malachy Mood, MD No   Overview Signed 04/12/2017  8:52 PM by Malachy Mood, MD    HgbA1C at [facility], early 1-hr ordered      Short interval between pregnancies affecting pregnancy, antepartum 04/11/2017 by Malachy Mood, MD No   DEPRESSION 07/06/2010 by Owens Loffler, MD No   Overview Signed 10/14/2010  6:18 PM by Interface, Problem List In    Qualifier: Diagnosis of  By: Copland MD, Spencer            Preterm labor symptoms and general obstetric precautions including but not limited to vaginal bleeding, contractions, leaking of fluid and fetal movement were reviewed in detail with the patient. Please refer to After Visit Summary for other counseling recommendations.   -Continue to monitor BG closely. No insulin or glyburide for now.  Let us know if  bgs become elevated. - monitor BPs.   - change u/s next week to growth/afi.  Continue twice weekly NST with once weekly AFI.  - continue plan for delivery currently set.  Return for change next week u/s to growth w ROB/NST, make appts for following wk (ROB/nst mon, AFI/ROB/NST thur.  Prentice Docker, MD  10/07/2017 2:37 PM

## 2017-10-10 ENCOUNTER — Ambulatory Visit (INDEPENDENT_AMBULATORY_CARE_PROVIDER_SITE_OTHER): Payer: 59 | Admitting: Obstetrics and Gynecology

## 2017-10-10 ENCOUNTER — Observation Stay
Admission: EM | Admit: 2017-10-10 | Discharge: 2017-10-12 | Disposition: A | Discharge status: Home / Self Care | Payer: 59 | Attending: Obstetrics & Gynecology | Admitting: Obstetrics & Gynecology

## 2017-10-10 ENCOUNTER — Encounter: Payer: Self-pay | Admitting: Obstetrics and Gynecology

## 2017-10-10 VITALS — BP 168/96 | Wt 253.0 lb

## 2017-10-10 DIAGNOSIS — O24414 Gestational diabetes mellitus in pregnancy, insulin controlled: Secondary | ICD-10-CM | POA: Insufficient documentation

## 2017-10-10 DIAGNOSIS — Z882 Allergy status to sulfonamides status: Secondary | ICD-10-CM | POA: Insufficient documentation

## 2017-10-10 DIAGNOSIS — O34219 Maternal care for unspecified type scar from previous cesarean delivery: Secondary | ICD-10-CM

## 2017-10-10 DIAGNOSIS — O99210 Obesity complicating pregnancy, unspecified trimester: Secondary | ICD-10-CM

## 2017-10-10 DIAGNOSIS — Z7982 Long term (current) use of aspirin: Secondary | ICD-10-CM | POA: Insufficient documentation

## 2017-10-10 DIAGNOSIS — O99213 Obesity complicating pregnancy, third trimester: Secondary | ICD-10-CM | POA: Insufficient documentation

## 2017-10-10 DIAGNOSIS — O119 Pre-existing hypertension with pre-eclampsia, unspecified trimester: Secondary | ICD-10-CM | POA: Diagnosis present

## 2017-10-10 DIAGNOSIS — O0990 Supervision of high risk pregnancy, unspecified, unspecified trimester: Secondary | ICD-10-CM

## 2017-10-10 DIAGNOSIS — Z3A35 35 weeks gestation of pregnancy: Secondary | ICD-10-CM

## 2017-10-10 DIAGNOSIS — J988 Other specified respiratory disorders: Secondary | ICD-10-CM

## 2017-10-10 DIAGNOSIS — Z794 Long term (current) use of insulin: Secondary | ICD-10-CM | POA: Diagnosis not present

## 2017-10-10 DIAGNOSIS — Z3A12 12 weeks gestation of pregnancy: Secondary | ICD-10-CM | POA: Diagnosis not present

## 2017-10-10 DIAGNOSIS — Z79899 Other long term (current) drug therapy: Secondary | ICD-10-CM | POA: Insufficient documentation

## 2017-10-10 DIAGNOSIS — E669 Obesity, unspecified: Secondary | ICD-10-CM | POA: Insufficient documentation

## 2017-10-10 DIAGNOSIS — O09899 Supervision of other high risk pregnancies, unspecified trimester: Secondary | ICD-10-CM

## 2017-10-10 DIAGNOSIS — O288 Other abnormal findings on antenatal screening of mother: Secondary | ICD-10-CM

## 2017-10-10 DIAGNOSIS — O113 Pre-existing hypertension with pre-eclampsia, third trimester: Secondary | ICD-10-CM | POA: Diagnosis not present

## 2017-10-10 DIAGNOSIS — O139 Gestational [pregnancy-induced] hypertension without significant proteinuria, unspecified trimester: Secondary | ICD-10-CM | POA: Insufficient documentation

## 2017-10-10 DIAGNOSIS — O24419 Gestational diabetes mellitus in pregnancy, unspecified control: Secondary | ICD-10-CM | POA: Diagnosis present

## 2017-10-10 LAB — COMPREHENSIVE METABOLIC PANEL
ALBUMIN: 3 g/dL — AB (ref 3.5–5.0)
ALK PHOS: 94 U/L (ref 38–126)
ALT: 18 U/L (ref 14–54)
AST: 22 U/L (ref 15–41)
Anion gap: 8 (ref 5–15)
BILIRUBIN TOTAL: 0.4 mg/dL (ref 0.3–1.2)
BUN: 10 mg/dL (ref 6–20)
CALCIUM: 8.8 mg/dL — AB (ref 8.9–10.3)
CO2: 21 mmol/L — ABNORMAL LOW (ref 22–32)
CREATININE: 0.5 mg/dL (ref 0.44–1.00)
Chloride: 106 mmol/L (ref 101–111)
GFR calc Af Amer: >60 mL/min (ref >60)
GLUCOSE: 98 mg/dL (ref 65–99)
POTASSIUM: 3.7 mmol/L (ref 3.5–5.1)
Sodium: 135 mmol/L (ref 135–145)
TOTAL PROTEIN: 6.1 g/dL — AB (ref 6.5–8.1)

## 2017-10-10 LAB — CBC
HCT: 37 % (ref 35.0–47.0)
HEMOGLOBIN: 12.8 g/dL (ref 12.0–16.0)
MCH: 28.4 pg (ref 26.0–34.0)
MCHC: 34.6 g/dL (ref 32.0–36.0)
MCV: 81.9 fL (ref 80.0–100.0)
Platelets: 228 10*3/uL (ref 150–440)
RBC: 4.51 MIL/uL (ref 3.80–5.20)
RDW: 14.1 % (ref 11.5–14.5)
WBC: 10.4 10*3/uL (ref 3.6–11.0)

## 2017-10-10 LAB — PROTEIN / CREATININE RATIO, URINE
Creatinine, Urine: 103 mg/dL
Protein Creatinine Ratio: 0.33 mg/mg{Cre} — ABNORMAL HIGH (ref 0.00–0.15)
Total Protein, Urine: 34 mg/dL

## 2017-10-10 LAB — GLUCOSE, CAPILLARY: GLUCOSE-CAPILLARY: 122 mg/dL — AB (ref 65–99)

## 2017-10-10 MED ORDER — SODIUM CHLORIDE 0.9% FLUSH
3.0000 mL | Freq: Two times a day (BID) | INTRAVENOUS | Status: DC
  Administered 2017-10-12: 3 mL via INTRAVENOUS

## 2017-10-10 MED ORDER — LABETALOL HCL 5 MG/ML IV SOLN
40.0000 mg | Freq: Once | INTRAVENOUS | Status: DC | PRN
  Filled 2017-10-10: qty 8

## 2017-10-10 MED ORDER — LABETALOL HCL 5 MG/ML IV SOLN
20.0000 mg | Freq: Once | INTRAVENOUS | Status: DC | PRN
  Filled 2017-10-10: qty 4

## 2017-10-10 MED ORDER — LABETALOL HCL 100 MG PO TABS
100.0000 mg | ORAL_TABLET | Freq: Two times a day (BID) | ORAL | Status: DC
  Administered 2017-10-10 – 2017-10-12 (×4): 100 mg via ORAL
  Filled 2017-10-10 (×4): qty 1

## 2017-10-10 MED ORDER — ACETAMINOPHEN 325 MG PO TABS: 650.0000 mg | ORAL_TABLET | ORAL | Status: DC | PRN

## 2017-10-10 MED ORDER — PRENATAL MULTIVITAMIN CH
1.0000 | ORAL_TABLET | Freq: Every day | ORAL | Status: DC
  Administered 2017-10-10 – 2017-10-11 (×2): 1 via ORAL
  Filled 2017-10-10 (×3): qty 1

## 2017-10-10 MED ORDER — LABETALOL HCL 5 MG/ML IV SOLN
80.0000 mg | Freq: Once | INTRAVENOUS | Status: DC | PRN
  Filled 2017-10-10: qty 16

## 2017-10-10 MED ORDER — SODIUM CHLORIDE 0.9 % IJ SOLN
INTRAMUSCULAR | Status: AC
  Administered 2017-10-10: 10 mL
  Filled 2017-10-10: qty 50

## 2017-10-10 MED ORDER — LABETALOL HCL 100 MG PO TABS: 100.0000 mg | ORAL_TABLET | Freq: Two times a day (BID) | ORAL | 6 refills | Status: DC

## 2017-10-10 MED ORDER — PANTOPRAZOLE SODIUM 40 MG PO TBEC
80.0000 mg | DELAYED_RELEASE_TABLET | Freq: Every day | ORAL | Status: DC
  Administered 2017-10-10 – 2017-10-11 (×2): 80 mg via ORAL
  Filled 2017-10-10 (×3): qty 2

## 2017-10-10 NOTE — OB Triage Note (Signed)
Ms. Constantin here with elevated BP in office, headache and change in use of medications for gestational diabetes (none since hospital discharge) MD desires follow up labs

## 2017-10-10 NOTE — H&P (Signed)
Obstetric H&P   Chief Complaint: Elevated blood pressure in clinic  Prenatal Care Provider: [facility]  History of Present Illness: 32 y.o. [id number] [redacted]w[redacted]d by 11/13/2017, by Last Menstrual Period presenting to L&D for elevated blood pressure in clinic (168/96). She reports a mild headache (2/10) and has not taken any medication to relieve it. She denies visual changes and blurred vision, epigastric pain, new edema, vaginal bleeding, and loss of fluid. Reports good fetal movement. Notes that she has not been using insulin and blood glucose checks have been within normal range.  Review of Systems: 10 point review of systems negative unless otherwise noted in HPI  Past Medical History: Past Medical History:  Diagnosis Date  . Allergy   . Gestational diabetes   . History of chicken pox   . Hypertension   . Tachycardia     Past Surgical History: Past Surgical History:  Procedure Laterality Date  . CESAREAN SECTION  11/26/11  . CESAREAN SECTION N/A 04/01/2016   Procedure: CESAREAN SECTION;  Surgeon: Gae Dry, MD;  Location: [facility] ORS;  Service: Obstetrics;  Laterality: N/A;  Time of birth 18:50 Sex: Female Weight: 7 lbs, 13 oz   . KNEE SURGERY  2005  . LARYNX SURGERY    . TONSILLECTOMY      Past Obstetric History: [id number]  Past Gynecologic History: none  Family History: Noncontributory  Social History: Social History   Socioeconomic History  . Marital status: Married    Spouse name: Not on file  . Number of children: Not on file  . Years of education: Not on file  . Highest education level: Not on file  Social Needs  . Financial resource strain: Not on file  . Food insecurity - worry: Not on file  . Food insecurity - inability: Not on file  . Transportation needs - medical: Not on file  . Transportation needs - non-medical: Not on file  Occupational History  . Occupation: cna    Employer: [employer]  Tobacco Use  . Smoking status: Never Smoker  . Smokeless tobacco:  Never Used  Substance and Sexual Activity  . Alcohol use: No    Alcohol/week: 0.0 oz  . Drug use: No  . Sexual activity: Yes  Other Topics Concern  . Not on file  Social History Narrative   Regular exercise--yes    Medications: Prior to Admission medications   Medication Sig Start Date End Date Taking? Authorizing Provider  albuterol (PROVENTIL) (2.5 MG/3ML) 0.083% nebulizer solution Take 3 mLs (2.5 mg total) by nebulization every 6 (six) hours as needed for wheezing or shortness of breath. Patient not taking: Reported on 10/02/2017 05/29/17   Darel Hong, MD  aspirin 81 MG chewable tablet Chew by mouth daily.    [provider]  fluticasone (FLONASE) 50 MCG/ACT nasal spray Place 2 sprays into both nostrils daily. Patient not taking: Reported on 10/02/2017 08/23/17   Johnn Hai, PA-C  insulin glargine (LANTUS) 100 UNIT/ML injection Inject 0.3 mLs (30 Units total) into the skin at bedtime. 10/04/17   Malachy Mood, MD  insulin lispro (HUMALOG) 100 UNIT/ML injection Inject 0.06 mLs (6 Units total) into the skin 3 (three) times daily before meals. 10/04/17   Malachy Mood, MD  INSULIN SYRINGE .5CC/29G 29G X 1/2" 0.5 ML MISC Use as directed 10/04/17   Malachy Mood, MD  labetalol (NORMODYNE) 100 MG tablet Take 1 tablet (100 mg total) by mouth 2 (two) times daily. 10/10/17   Malachy Mood, MD  omeprazole ([place])  40 MG capsule Take 1 capsule (40 mg total) daily by mouth. 07/26/17 07/26/18  Malachy Mood, MD  ondansetron (ZOFRAN ODT) 4 MG disintegrating tablet Take 1 tablet (4 mg total) by mouth every 6 (six) hours as needed for nausea. 10/04/17   Malachy Mood, MD  Prenatal Vit-Fe Fumarate-FA (MULTIVITAMIN-PRENATAL) 27-0.8 MG TABS Take 1 tablet by mouth daily. Reported on 10/20/2015    [provider]  Spacer/Aero Chamber Mouthpiece MISC 1 Units by Does not apply route every 4 (four) hours as needed (wheezing). Patient not taking: Reported on  08/11/2017 05/29/17   Darel Hong, MD  Spacer/Aero-Holding Josiah Lobo ([place]) [place]  05/30/17   [provider]    Allergies: Allergies  Allergen Reactions  . Sulfa Antibiotics Hives  . Sulfonamide Derivatives Hives    Physical Exam: Vitals: Blood pressure (!) 149/93, pulse (!) 114, temperature 98.2 F (36.8 C), temperature source Oral, resp. rate 18, height 5\' 7"  (1.702 m), weight 253 lb (114.8 kg), last menstrual period 02/06/2017.   FHT: Baseline 140, moderate variability, accelerations present, decelerations absent. Reactive NST; Category I tracing. Toco: occasional uterine irritability  General: NAD HEENT: normocephalic, anicteric Pulmonary: No increased work of breathing, CTAB Cardiovascular: RRR, S1, S2 auscultated, no murmur, rub or gallop, tachycardia Abdomen: Gravid, non-tender Genitourinary: cervical exam deferred Extremities: no edema, erythema, or tenderness Neurologic: Grossly intact Psychiatric: mood appropriate, affect full  Labs: Results for orders placed or performed during the hospital encounter of 10/10/17 (from the past 24 hour(s))  CBC     Status: None   Collection Time: 10/10/17  3:32 PM  Result Value Ref Range   WBC 10.4 3.6 - 11.0 K/uL   RBC 4.51 3.80 - 5.20 MIL/uL   Hemoglobin 12.8 12.0 - 16.0 g/dL   HCT 37.0 35.0 - 47.0 %   MCV 81.9 80.0 - 100.0 fL   MCH 28.4 26.0 - 34.0 pg   MCHC 34.6 32.0 - 36.0 g/dL   RDW 14.1 11.5 - 14.5 %   Platelets 228 150 - 440 K/uL  Comprehensive metabolic panel     Status: Abnormal   Collection Time: 10/10/17  3:32 PM  Result Value Ref Range   Sodium 135 135 - 145 mmol/L   Potassium 3.7 3.5 - 5.1 mmol/L   Chloride 106 101 - 111 mmol/L   CO2 21 (L) 22 - 32 mmol/L   Glucose, Bld 98 65 - 99 mg/dL   BUN 10 6 - 20 mg/dL   Creatinine, Ser 0.50 0.44 - 1.00 mg/dL   Calcium 8.8 (L) 8.9 - 10.3 mg/dL   Total Protein 6.1 (L) 6.5 - 8.1 g/dL   Albumin 3.0 (L) 3.5 - 5.0 g/dL   AST 22 15 - 41 U/L   ALT  18 14 - 54 U/L   Alkaline Phosphatase 94 38 - 126 U/L   Total Bilirubin 0.4 0.3 - 1.2 mg/dL   GFR calc non Af Amer >60 >60 mL/min   GFR calc Af Amer >60 >60 mL/min   Anion gap 8 5 - 15  Protein / creatinine ratio, urine     Status: Abnormal   Collection Time: 10/10/17  3:41 PM  Result Value Ref Range   Creatinine, Urine 103 mg/dL   Total Protein, Urine 34 mg/dL   Protein Creatinine Ratio 0.33 (H) 0.00 - 0.15 mg/mg[Cre]    Assessment: 32 y.o. [id number] [redacted]w[redacted]d by 11/13/2017, by Last Menstrual Period with elevated blood pressures.  Plan: 1) Monitor blood pressures and treat with IV labetalol as  needed for severe range pressures per order parameters. Continue PO labetalol 100 mg BID.  2) GDM - patient has not had insulin needs at with normal BG readings at home. Blood glucose monitoring AC&HS, will order SSI as needed. Carb controlled diet ordered.  3) Fetus - Reactive NST and FWB reassuring, fetal heart tones q shift and NST daily while in hospital. Received betamethasone X 2 during admission on 1/27-29/2019.  4) Disposition - Observe overnight, may need extended admission if blood pressures remain elevated or any new onset of pre-eclampsia symptoms. Patient is aware and agrees to this plan.  Avel Sensor, CNM 10/10/2017  8:24 PM

## 2017-10-10 NOTE — Progress Notes (Signed)
ROB NST 

## 2017-10-10 NOTE — Progress Notes (Signed)
Routine Prenatal Care Visit  Subjective  Mallory Pearson is a 32 y.o. O1H0865 at [redacted]w[redacted]d being seen today for ongoing prenatal care.  She is currently monitored for the following issues for this high-risk pregnancy and has DEPRESSION; History of cesarean section complicating pregnancy; Short interval between pregnancies affecting pregnancy, antepartum; Maternal obesity, antepartum; High-risk pregnancy supervision, unspecified trimester; Airway stricture; Gestational diabetes; Chronic hypertension with superimposed preeclampsia; Indication for care in labor and delivery, antepartum; and Gestational hypertension on their problem list.  ----------------------------------------------------------------------------------- Patient reports no complaints.   Contractions: Not present. Vag. Bleeding: None.  Movement: Present. Denies leaking of fluid.  ----------------------------------------------------------------------------------- The following portions of the patient's history were reviewed and updated as appropriate: allergies, current medications, past family history, past medical history, past social history, past surgical history and problem list. Problem list updated.   Objective  Blood pressure (!) 168/96, weight 253 lb (114.8 kg), last menstrual period 02/06/2017, unknown if currently breastfeeding. Pregravid weight 222 lb (100.7 kg) Total Weight Gain 31 lb (14.1 kg) Urinalysis: Urine Protein: Trace Urine Glucose: Negative  Fetal Status: Fetal Heart Rate (bpm): 145   Movement: Present     General:  Alert, oriented and cooperative. Patient is in no acute distress.  Skin: Skin is warm and dry. No rash noted.   Cardiovascular: Normal heart rate noted  Respiratory: Normal respiratory effort, no problems with respiration noted  Abdomen: Soft, gravid, appropriate for gestational age. Pain/Pressure: Absent     Pelvic:  Cervical exam deferred        Extremities: Normal range of motion.     ental  Status: Normal mood and affect. Normal behavior. Normal judgment and thought content.   Baseline: 145 Variability: moderate Accelerations: present Decelerations: absent Tocometry: none The patient was monitored for 30 minutes, fetal heart rate tracing was deemed reactive, category I tracing,  Assessment   32 y.o. [id number] at [redacted]w[redacted]d by  11/13/2017, by Last Menstrual Period presenting for routine prenatal visit  Plan   pregnancy#4 Problems (from 02/06/17 to present)    Problem Noted Resolved   High-risk pregnancy supervision, unspecified trimester 04/12/2017 by Malachy Mood, MD No   Priority:  High     Overview Addendum 10/10/2017  2:09 PM by Malachy Mood, MD    [facility] Prenatal Labs  Dating [place] Blood type: A positive  Genetic Screen 1 Screen:    AFP:     Quad:     NIPS: Antibody:Negative  Anatomic [place] WS Rubella: Immune Varicella: Immune  GTT Early:129    Third trimester:185 3-hr: 133 / 235 / 212 / 148 RPR: NR  Rhogam N/A HBsAg: Negative  TDaP vaccine      09/14/17    Flu Shot: UTD HIV: Negative  Baby Food          BREAST                      GBS:   Contraception  Pap: NIL HPV negative 04/11/17  CBB   Baseline P/C ratio 169mg /dL  CS/VBAC         CS   Support Person    Growth 32w 4lbs 12oz or 2157g c/w74%ile           Gestational diabetes 09/15/2017 by Malachy Mood, MD No   Overview Addendum 10/10/2017  2:08 PM by Malachy Mood, MD    Current Diabetic Medications:  Glyburide (initially 2.5mg  on 1/10, increased to 5mg  HS on 1/21) then Insulin Lantus 30U qhs and  6 units AC TID has not taken and BG running normal currently [x]  Aspirin 81 mg daily after 12 weeks; discontinue after 36 weeks (? A2/B GDM)  Required Referrals for A1GDM or A2GDM: [x]  Diabetes Education and Testing Supplies [x]  Nutrition Cousult  For A2/B GDM or higher classes of DM [x]  Diabetes Education and Testing Supplies [x]  Nutrition Counsult [x]  Baseline EKG [x]  US fetal growth every 4 weeks  starting at 28 weeks [x]  Twice weekly NST starting at [redacted] weeks gestation [ ]  Delivery planning contingent on fetal growth, AFI, glycemic control, and other co-morbidities but at least by 39 weeks  Baseline and surveillance labs (pulled in from [employer], refresh links as needed)  Lab Results  Component Value Date   CREATININE 0.58 10/03/2017   AST 24 10/03/2017   ALT 14 10/03/2017   TSH 1.289 10/02/2017   PROTCRRATIO 0.45 (H) 10/02/2017   PROTEIN24HR 384 (H) 10/04/2017   No results found for: HGBA1C  Antenatal Testing Class of DM U/S NST/AFI DELIVERY  Diabetes   A1 - good control - O24.410    A2 - good control - O24.419      A2  - poor control or poor compliance - O24.419, E11.65   (Macrosomia or polyhydramnios) **E11.65 is extra code for poor control**    A2/B - O24.919  and B-C O24.319  Poor control B-C or D-R-F-T - O24.319  or  Type I DM - O24.019  20-38  20-38  20-24-28-32-36   20-24-28-32-35-38//fetal echo  20-24-27-30-33-36-38//fetal echo  40  32//2 x wk  32//2 x wk   32//2 x wk  28//BPP wkly then 32//2 x wk  40  39  PRN   39  PRN          Elevated glucose tolerance test 08/26/2017 by Malachy Mood, MD No   Overview Signed 08/26/2017  1:06 PM by Malachy Mood, MD    185 on 1-hr      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 by Malachy Mood, MD No   Overview Addendum 07/13/2017  5:07 PM by Malachy Mood, MD    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 [facility]      Maternal obesity, antepartum 04/12/2017 by Malachy Mood, MD No   Overview Signed 04/12/2017  8:53 PM by Malachy Mood, MD    BMI >=40 [ ]  early 1h gtt -  [ ]  u/s for dating [ ]   [ ]  nutritional goals [ ]  folic acid 1mg  [ ]  bASA (>12 weeks) [ ]  consider nutrition consult [ ]  consider maternal EKG 1st trimester [ ]  Growth u/s 28 [ ] , 32 [ ] , 36 weeks [ ]  [ ]  NST/AFI weekly 36+ weeks (36[] , 37[] , 38[] , 39[] , 40[] ) [ ]  IOL by 41 weeks (scheduled, prn [] )       Hx of  preeclampsia, prior pregnancy, currently pregnant, unspecified trimester 04/11/2017 by Malachy Mood, MD No   Overview Addendum 04/14/2017  1:06 PM by Malachy Mood, MD    [X]  Baseline P/C ratio 169mg /dL [ ]  Aspirin 81 mg daily after 12 weeks; discontinue after 36 weeks   Lab Results  Component Value Date   PLT 272 04/11/2017   CREATININE 0.64 04/11/2017   AST 8 04/11/2017   ALT 6 04/11/2017          History of cesarean section complicating pregnancy 10/08/252 by Malachy Mood, MD No   Overview Signed 04/12/2017  8:52 PM by Malachy Mood, MD    Desires repeat      History of gestational diabetes  in prior pregnancy, currently pregnant 04/11/2017 by Malachy Mood, MD No   Overview Signed 04/12/2017  8:52 PM by Malachy Mood, MD    HgbA1C at [facility], early 1-hr ordered      Short interval between pregnancies affecting pregnancy, antepartum 04/11/2017 by Malachy Mood, MD No   DEPRESSION 07/06/2010 by Owens Loffler, MD No   Overview Signed 10/14/2010  6:18 PM by Interface, Problem List In    Qualifier: Diagnosis of  By: Copland MD, Frederico Hamman            Term labor symptoms and general obstetric precautions including but not limited to vaginal bleeding, contractions, leaking of fluid and fetal movement were reviewed in detail with the patient. Please refer to After Visit Summary for other counseling recommendations.  - start labetalol 100mg  po bid, to L&D for preeclampsia work up - BP check 3 days with repeat NST/AFI - BG at goal no medications - Growth scan at 36 weeks  Return in about 3 days (around 10/13/2017) for NST/AFI.

## 2017-10-11 ENCOUNTER — Other Ambulatory Visit: Payer: Self-pay

## 2017-10-11 DIAGNOSIS — Z79899 Other long term (current) drug therapy: Secondary | ICD-10-CM | POA: Diagnosis not present

## 2017-10-11 DIAGNOSIS — Z882 Allergy status to sulfonamides status: Secondary | ICD-10-CM | POA: Diagnosis not present

## 2017-10-11 DIAGNOSIS — O24414 Gestational diabetes mellitus in pregnancy, insulin controlled: Secondary | ICD-10-CM

## 2017-10-11 DIAGNOSIS — O99213 Obesity complicating pregnancy, third trimester: Secondary | ICD-10-CM | POA: Diagnosis not present

## 2017-10-11 DIAGNOSIS — Z3A35 35 weeks gestation of pregnancy: Secondary | ICD-10-CM

## 2017-10-11 DIAGNOSIS — Z7982 Long term (current) use of aspirin: Secondary | ICD-10-CM | POA: Diagnosis not present

## 2017-10-11 DIAGNOSIS — O113 Pre-existing hypertension with pre-eclampsia, third trimester: Secondary | ICD-10-CM

## 2017-10-11 DIAGNOSIS — E669 Obesity, unspecified: Secondary | ICD-10-CM | POA: Diagnosis not present

## 2017-10-11 DIAGNOSIS — Z794 Long term (current) use of insulin: Secondary | ICD-10-CM | POA: Diagnosis not present

## 2017-10-11 LAB — GLUCOSE, CAPILLARY
Glucose-Capillary: 85 mg/dL (ref 65–99)
Glucose-Capillary: 93 mg/dL (ref 65–99)
Glucose-Capillary: 106 mg/dL — ABNORMAL HIGH (ref 65–99)

## 2017-10-11 MED ORDER — HEPARIN SODIUM (PORCINE) 5000 UNIT/ML IJ SOLN
5000.0000 [IU] | Freq: Two times a day (BID) | INTRAMUSCULAR | Status: DC
Start: 2017-10-11 — End: 2017-10-12
  Administered 2017-10-11 – 2017-10-12 (×2): 5000 [IU] via SUBCUTANEOUS
  Filled 2017-10-11 (×3): qty 1

## 2017-10-11 MED ORDER — INSULIN DETEMIR 100 UNIT/ML ~~LOC~~ SOLN
10.0000 [IU] | Freq: Two times a day (BID) | SUBCUTANEOUS | Status: DC
  Administered 2017-10-11 – 2017-10-12 (×3): 10 [IU] via SUBCUTANEOUS
  Filled 2017-10-11 (×5): qty 0.1

## 2017-10-11 NOTE — Procedures (Signed)
Procedure Non-Stress Test  Baseline FHR: 135 beats/min Variability: moderate Accelerations: present Decelerations: absent Tocometry: quiet  Interpretation:  INDICATIONS: Chronic hypertension with superimposed preeclampsia, Gestational diabetes controlled by insulin RESULTS:  A NST procedure was performed with FHR monitoring and a normal baseline established, appropriate time of 20-40 minutes of evaluation, and accels >2 seen w 15x15 characteristics.  Results show a REACTIVE NST.    Prentice Docker, MD 10/11/2017 7:03 PM

## 2017-10-11 NOTE — Progress Notes (Signed)
Patient ID: Mallory Pearson, female   DOB: 1986-05-13, 32 y.o.   MRN: [id number]  Daily Antepartum Note  Admission Date: 10/10/2017 Current Date: 10/11/2017 1:51 PM  BABETTE STUM is a 32 y.o. [id number] @ [redacted]w[redacted]d by LMP, HD#2, admitted for superimposed preeclampsia with worsening blood pressures, requiring antihypertensives.  Pregnancy complicated by:  Patient Active Problem List   Diagnosis Date Noted  . Chronic hypertension with superimposed preeclampsia 10/02/2017  . Gestational diabetes 09/15/2017  . 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  . Maternal obesity, antepartum 04/12/2017  . High-risk pregnancy supervision, unspecified trimester 04/12/2017  . History of cesarean section complicating pregnancy 73/41/9379  . Short interval between pregnancies affecting pregnancy, antepartum 04/11/2017  . DEPRESSION 07/06/2010    Overnight/24hr events:  No acute events overnight  Subjective:  Denies headaches, visual changes, right upper quadrant pain. She notes +FM, no LOF, no vaginal bleeding, and no contractions.   Objective:   Vitals:   10/11/17 0954 10/11/17 1151  BP: (!) 143/98 (!) 139/93  Pulse: (!) 103 99  Resp:  18  Temp:  98 F (36.7 C)  SpO2:     Temp:  [97.8 F (36.6 C)-98.2 F (36.8 C)] 98 F (36.7 C) (02/05 1151) Pulse Rate:  [94-118] 99 (02/05 1151) Resp:  [16-18] 18 (02/05 1151) BP: (136-168)/(85-112) 139/93 (02/05 1151) SpO2:  [97 %-100 %] 98 % (02/05 0740) Weight:  [253 lb (114.8 kg)] 253 lb (114.8 kg) (02/04 1511) Temp (24hrs), Avg:98 F (36.7 C), Min:97.8 F (36.6 C), Max:98.2 F (36.8 C)   Intake/Output Summary (Last 24 hours) at 10/11/2017 1351 Last data filed at 10/11/2017 1200 Gross per 24 hour  Intake 250 ml  Output 2601 ml  Net -2351 ml     Current Vital Signs 24h Vital Sign Ranges  T 98 F (36.7 C) Temp  Avg: 98 F (36.7 C)  Min: 97.8 F (36.6 C)  Max: 98.2 F (36.8 C)  BP (!) 139/93(nurse J. Holmes notified) BP  Min: 136/90  Max: 168/96  HR 99 Pulse   Avg: 101.8  Min: 94  Max: 118  RR 18 Resp  Avg: 17.6  Min: 16  Max: 18  SaO2 98 % Not Delivered SpO2  Avg: 98.3 %  Min: 97 %  Max: 100 %       24 Hour I/O Current Shift I/O  Time Ins Outs 02/04 0701 - 02/05 0700 In: 10 [I.V.:10] Out: 1050 [Urine:1050] 02/05 0701 - 02/05 1900 In: 240 [P.O.:240] Out: 0240 [Urine:1550]   Patient Vitals for the past 24 hrs:  BP Temp Temp src Pulse Resp SpO2 Height Weight  10/11/17 1151 (!) 139/93 98 F (36.7 C) Tympanic 99 18 - - -  10/11/17 0954 (!) 143/98 - - (!) 103 - - - -  10/11/17 0740 (!) 151/92 98.1 F (36.7 C) Oral 96 17 98 % - -  10/11/17 0355 (!) 143/96 98 F (36.7 C) Oral (!) 106 18 98 % - -  10/10/17 2344 (!) 147/93 97.8 F (36.6 C) Oral 96 16 97 % - -  10/10/17 2022 (!) 152/85 98.1 F (36.7 C) Oral (!) 107 18 100 % - -  10/10/17 2008 136/90 - - (!) 110 18 - - -  10/10/17 1938 (!) 149/93 - - (!) 114 - - - -  10/10/17 1908 (!) 152/100 - - 100 - - - -  10/10/17 1838 (!) 147/87 - - 100 - - - -  10/10/17 1811 (!) 143/90 - - (!) 102 - - - -  10/10/17 1741 (!) 158/92 - - 100 - - - -  10/10/17 1711 (!) 150/99 - - 99 - - - -  10/10/17 1656 139/90 - - 94 - - - -  10/10/17 1641 (!) 149/93 - - 99 - - - -  10/10/17 1626 (!) 153/92 - - 97 - - - -  10/10/17 1611 140/85 - - 99 - - - -  10/10/17 1556 (!) 147/91 - - 94 - - - -  10/10/17 1541 (!) 152/103 - - (!) 104 - - - -  10/10/17 1526 (!) 162/112 - - (!) 118 - - - -  10/10/17 1511 (!) 147/96 98.2 F (36.8 C) Oral (!) 101 18 - 5\' 7"  (1.702 m) 253 lb (114.8 kg)    Physical exam: General: Well nourished, well developed female in no acute distress. Abdomen: gravid NT Cardiovascular: tachycardic (rate low 100s), rhythm regular Respiratory: CTAB Extremities: no clubbing, cyanosis or edema Skin: Warm and dry.  Neuro: DTRs 2+ at brachioradialis  Medications: Current Facility-Administered Medications  Medication Dose Route Frequency Provider Last Rate Last Dose  . acetaminophen (TYLENOL)  tablet 650 mg  650 mg Oral Q4H PRN Rexene Agent, CNM      . heparin injection 5,000 Units  5,000 Units Subcutaneous Q12H Will Bonnet, MD      . insulin detemir (LEVEMIR) injection 10 Units  10 Units Subcutaneous BID Will Bonnet, MD   10 Units at 10/11/17 0957  . labetalol (NORMODYNE) tablet 100 mg  100 mg Oral BID Rexene Agent, CNM   100 mg at 10/11/17 0932  . labetalol (NORMODYNE,TRANDATE) injection 20 mg  20 mg Intravenous Once PRN Rexene Agent, CNM      . labetalol (NORMODYNE,TRANDATE) injection 40 mg  40 mg Intravenous Once PRN Rexene Agent, CNM      . labetalol (NORMODYNE,TRANDATE) injection 80 mg  80 mg Intravenous Once PRN Rexene Agent, CNM      . pantoprazole (PROTONIX) EC tablet 80 mg  80 mg Oral Daily Rexene Agent, CNM   80 mg at 10/11/17 3557  . prenatal multivitamin tablet 1 tablet  1 tablet Oral Daily Rexene Agent, CNM   1 tablet at 10/11/17 3220  . sodium chloride flush (NS) 0.9 % injection 3 mL  3 mL Intravenous Q12H Rexene Agent, CNM        Labs:  Recent Labs  Lab 10/10/17 1532  WBC 10.4  HGB 12.8  HCT 37.0  PLT 228    Recent Labs  Lab 10/10/17 1532  NA 135  K 3.7  CL 106  CO2 21*  BUN 10  CREATININE 0.50  CALCIUM 8.8*  PROT 6.1*  BILITOT 0.4  ALKPHOS 94  ALT 18  AST 22  GLUCOSE 98    Assessment & Plan:  32 y.o. [id number] at [redacted]w[redacted]d with superimposed preeclampsia, gestational diabetes requiring insulin (A2), obesity (BMI 40) *Pregnancy: PNVs, daily NST  * Superimposed preeclampsia: continue labetalol, Q 72 hour T&S/labs, continue to monitor BPs and need for increasing dose of antihypertensive. Currently well-controlled with BPs well under 160/110 without symptoms.  Considering monitoring in-house until delivery given at-home blood pressures >160s/110s.    * GDMA2: started basal insulin detemir 10 units Hilda BID today. Change CBG checks to fasting 2h pp, as per normal monitoring in pregnancy. May add  with-meal dosing depending on results.  *PPx: PPI for GI.  For VTE: add SCDs, SubQ heparin 5,000 units BID.  Discussed  this with anesthesia at length.  At this dose, she would qualify for neuraxial anesthesia 4-6 hours from a dose of heparin.  There is a strong concern for needing general anesthesia in an emergency given the recommended ET tube size of 6. This may be a difficult size tube to adequately ventilate this patient with BMI 40.  However, she is at very high risk of VTE event given her preeclampsia, obesity, and hospitalization status.  Mutual decision made after conferring with 3 different anesthesiologists to start heparin at the previously-described dose.  Patient encouraged to wear SCDs. If she can not tolerate them, then TED hose will have to suffice. Discussed with RN the importance of ensuring that the patient have her SCDs or at least TED hose on.  Discussed with patient the potential risks of this treatment course and she understands the risks and the considerations taken into account for this decision.  She voiced understanding and agreement to the plan.   *FEN/GI: diabetic diet. No MIVF.    *Dispo: likely after delivery. However, if her blood pressures are well-controlled on a stable dose of labetalol, she may be a candidate for discharge.  However, she has been explained that she should expect to stay in-house until delivery.  Prentice Docker, MD 10/11/2017 2:02 PM

## 2017-10-11 NOTE — OB Triage Note (Signed)
NST

## 2017-10-12 ENCOUNTER — Observation Stay: Payer: 59

## 2017-10-12 DIAGNOSIS — O24414 Gestational diabetes mellitus in pregnancy, insulin controlled: Secondary | ICD-10-CM | POA: Diagnosis not present

## 2017-10-12 DIAGNOSIS — Z3A00 Weeks of gestation of pregnancy not specified: Secondary | ICD-10-CM | POA: Diagnosis not present

## 2017-10-12 DIAGNOSIS — O113 Pre-existing hypertension with pre-eclampsia, third trimester: Secondary | ICD-10-CM | POA: Diagnosis not present

## 2017-10-12 DIAGNOSIS — Z882 Allergy status to sulfonamides status: Secondary | ICD-10-CM | POA: Diagnosis not present

## 2017-10-12 DIAGNOSIS — Z794 Long term (current) use of insulin: Secondary | ICD-10-CM | POA: Diagnosis not present

## 2017-10-12 DIAGNOSIS — E669 Obesity, unspecified: Secondary | ICD-10-CM | POA: Diagnosis not present

## 2017-10-12 DIAGNOSIS — O368339 Maternal care for abnormalities of the fetal heart rate or rhythm, third trimester, other fetus: Secondary | ICD-10-CM | POA: Diagnosis not present

## 2017-10-12 DIAGNOSIS — Z3A35 35 weeks gestation of pregnancy: Secondary | ICD-10-CM | POA: Diagnosis not present

## 2017-10-12 DIAGNOSIS — Z79899 Other long term (current) drug therapy: Secondary | ICD-10-CM | POA: Diagnosis not present

## 2017-10-12 DIAGNOSIS — O99213 Obesity complicating pregnancy, third trimester: Secondary | ICD-10-CM | POA: Diagnosis not present

## 2017-10-12 DIAGNOSIS — Z7982 Long term (current) use of aspirin: Secondary | ICD-10-CM | POA: Diagnosis not present

## 2017-10-12 LAB — GLUCOSE, CAPILLARY
GLUCOSE-CAPILLARY: 102 mg/dL — AB (ref 65–99)
Glucose-Capillary: 97 mg/dL (ref 65–99)
Glucose-Capillary: 122 mg/dL — ABNORMAL HIGH (ref 65–99)

## 2017-10-12 MED ORDER — INSULIN DETEMIR 100 UNIT/ML ~~LOC~~ SOLN: 10.0000 [IU] | Freq: Two times a day (BID) | SUBCUTANEOUS | 11 refills | Status: DC

## 2017-10-12 MED ORDER — LABETALOL HCL 100 MG PO TABS: 200.0000 mg | ORAL_TABLET | Freq: Two times a day (BID) | ORAL | 6 refills | Status: DC

## 2017-10-12 NOTE — Progress Notes (Signed)
Patient discharged home with spouse. Discharge instructions, prescriptions and follow up appointment given to and reviewed with patient and spouse. Patient verbalized understanding. Escorted out via wheelchair by nursing staff.

## 2017-10-12 NOTE — Progress Notes (Signed)
Patient ID: Mallory Pearson, female   DOB: 26-May-1986, 32 y.o.   MRN: [id number] Admit Date: 10/10/2017 Today's Date: 10/12/2017  Hospital Day 3  Subjective:   Patient feeling well. No complaints. Denies headache today. Denies RUQ pain. Denies vaginal bleeding or leakage of fluid. Positive fetal movement.   Objective: Temp:  [97.8 F (36.6 C)-99 F (37.2 C)] 98.2 F (36.8 C) (02/06 1021) Pulse Rate:  [93-105] 98 (02/06 1021) Resp:  [17-20] 18 (02/06 1021) BP: (119-147)/(63-98) 147/63 (02/06 1021) SpO2:  [97 %-98 %] 98 % (02/06 1021)  Physical Exam:  Physical Exam  Constitutional: She is oriented to person, place, and time. She appears well-developed and well-nourished.  HENT:  Head: Normocephalic and atraumatic.  Eyes: EOM are normal.  Cardiovascular: Normal rate, regular rhythm and normal heart sounds.  Pulmonary/Chest: Effort normal and breath sounds normal.  Abdominal: Soft.  Gravid   Neurological: She is alert and oriented to person, place, and time.  Skin: Skin is warm and dry.  Psychiatric: She has a normal mood and affect. Her behavior is normal. Judgment and thought content normal.  Nursing note and vitals reviewed.   Recent Labs    10/10/17 1532  HGB 12.8  HCT 37.0    NST: Reactive, category I Toco: Quiet  Assessment/Plan: 31yo G4P2012 at 35 wks 3 days gestational age 52. Gestational diabetes- blood glucise within normal limits in hospital. Continue Levemir 10 units am and pm. 2. Mild Preeclampsia- blood pressures controlled on 100mg  labetalol BID 3. Daily NST 4. Anticipate discharge home this evening. Patient has close follow up planned for office next weel.    LOS: 0 days   [place] 10/12/2017, 11:10 AM

## 2017-10-12 NOTE — Discharge Instructions (Signed)
Take blood pressure 4 times a day at home. Call office or come to hospital for elevated blood pressures of more than 453 systolic 646 diastolic.  Come to the hospital for headache, vision changes, right upper qudrant pain, vaginal bleeding or sharp abdominal pain.  Call office or come to hospital for decreased fetal movement.

## 2017-10-12 NOTE — OB Triage Note (Signed)
Pt brought over from MB unit for NST. Pt denies VB, LOF, Headache. Pt states positive FM. Fetal monitors applied and assessing. FHT 135

## 2017-10-12 NOTE — Discharge Summary (Signed)
Physician Discharge Summary   Patient ID: Mallory Pearson [id number] 31 y.o. [birth year]  Admit date: 10/10/2017  Discharge date and time: No discharge date for patient encounter.   Admitting Physician: Gae Dry, MD   Discharge Physician: Adrian Prows MD  Admission Diagnoses: 35 weeks- high bp  Discharge Diagnoses: Mild pre-eclampsia, Gestational diabetes  Admission Condition: good  Discharged Condition: good  Indication for Admission: Elevated blood pressures  Hospital Course: Patient was admitted, blood pressures and blood glucose were monitored. Daily NSTs.  Consults: None  Significant Diagnostic Studies: Please see results review  Treatments: labetalol and insulin  Discharge Exam: BP (!) 155/94 (BP Location: Left Arm)   Pulse (!) 106   Temp 98 F (36.7 C) (Oral)   Resp 18   Ht 5\' 7"  (1.702 m)   Wt 253 lb (114.8 kg)   LMP 02/06/2017 (Exact Date)   SpO2 98%   BMI 39.63 kg/m   General Appearance:    Alert, cooperative, no distress, appears stated age  Head:    Normocephalic, without obvious abnormality, atraumatic  Eyes:    PERRL, conjunctiva/corneas clear, EOM's intact, fundi    benign, both eyes  Ears:    Normal TM's and external ear canals, both ears  Nose:   Nares normal, septum midline, mucosa normal, no drainage    or sinus tenderness  Throat:   Lips, mucosa, and tongue normal; teeth and gums normal  Neck:   Supple, symmetrical, trachea midline, no adenopathy;    thyroid:  no enlargement/tenderness/nodules; no carotid   bruit or JVD  Back:     Symmetric, no curvature, ROM normal, no CVA tenderness  Lungs:     Clear to auscultation bilaterally, respirations unlabored  Chest Wall:    No tenderness or deformity   Heart:    Regular rate and rhythm, S1 and S2 normal, no murmur, rub   or gallop  Breast Exam:    No tenderness, masses, or nipple abnormality  Abdomen:     Soft, non-tender, bowel sounds active all four quadrants,    no masses, no  organomegaly  Genitalia:    Normal female without lesion, discharge or tenderness  Rectal:    Normal tone, normal prostate, no masses or tenderness;   guaiac negative stool  Extremities:   Extremities normal, atraumatic, no cyanosis or edema  Pulses:   2+ and symmetric all extremities  Skin:   Skin color, texture, turgor normal, no rashes or lesions  Lymph nodes:   Cervical, supraclavicular, and axillary nodes normal  Neurologic:   CNII-XII intact, normal strength, sensation and reflexes    throughout    Disposition: 01-Home or Self Care  Patient Instructions:  Allergies as of 10/12/2017      Reactions   Sulfa Antibiotics Hives   Sulfonamide Derivatives Hives      Medication List    STOP taking these medications   insulin glargine 100 UNIT/ML injection Commonly known as:  LANTUS     TAKE these medications   albuterol (2.5 MG/3ML) 0.083% nebulizer solution Commonly known as:  PROVENTIL Take 3 mLs (2.5 mg total) by nebulization every 6 (six) hours as needed for wheezing or shortness of breath.   aspirin 81 MG chewable tablet Chew by mouth daily.   fluticasone 50 MCG/ACT nasal spray Commonly known as:  FLONASE Place 2 sprays into both nostrils daily.   insulin detemir 100 UNIT/ML injection Commonly known as:  LEVEMIR Inject 0.1 mLs (10 Units total) into the skin 2 (two)  times daily.   insulin lispro 100 UNIT/ML injection Commonly known as:  HUMALOG Inject 0.06 mLs (6 Units total) into the skin 3 (three) times daily before meals.   INSULIN SYRINGE .5CC/29G 29G X 1/2" 0.5 ML Misc Use as directed   labetalol 100 MG tablet Commonly known as:  NORMODYNE Take 2 tablets (200 mg total) by mouth 2 (two) times daily. What changed:  how much to take   multivitamin-prenatal 27-0.8 MG Tabs tablet Take 1 tablet by mouth daily. Reported on 10/20/2015   omeprazole 40 MG capsule Commonly known as:  PRILOSEC Take 1 capsule (40 mg total) daily by mouth.   ondansetron 4 MG  disintegrating tablet Commonly known as:  ZOFRAN ODT Take 1 tablet (4 mg total) by mouth every 6 (six) hours as needed for nausea.   optichamber diamond Misc   Spacer/Aero Chamber Mouthpiece Misc 1 Units by Does not apply route every 4 (four) hours as needed (wheezing).      Activity: activity as tolerated and ambulate in house Diet: regular diet Wound Care: none needed  Follow-up with office in 1 day.  Signed: Homero Fellers 10/12/2017 5:57 PM

## 2017-10-13 ENCOUNTER — Encounter: Payer: Self-pay | Admitting: Obstetrics and Gynecology

## 2017-10-13 ENCOUNTER — Ambulatory Visit (INDEPENDENT_AMBULATORY_CARE_PROVIDER_SITE_OTHER): Payer: 59

## 2017-10-13 ENCOUNTER — Ambulatory Visit (INDEPENDENT_AMBULATORY_CARE_PROVIDER_SITE_OTHER): Payer: 59 | Admitting: Obstetrics and Gynecology

## 2017-10-13 VITALS — BP 142/94 | Wt 250.0 lb

## 2017-10-13 DIAGNOSIS — Z3A35 35 weeks gestation of pregnancy: Secondary | ICD-10-CM | POA: Diagnosis not present

## 2017-10-13 DIAGNOSIS — O119 Pre-existing hypertension with pre-eclampsia, unspecified trimester: Secondary | ICD-10-CM | POA: Diagnosis not present

## 2017-10-13 DIAGNOSIS — O0990 Supervision of high risk pregnancy, unspecified, unspecified trimester: Secondary | ICD-10-CM

## 2017-10-13 DIAGNOSIS — O09899 Supervision of other high risk pregnancies, unspecified trimester: Secondary | ICD-10-CM

## 2017-10-13 DIAGNOSIS — Z6841 Body Mass Index (BMI) 40.0 and over, adult: Secondary | ICD-10-CM

## 2017-10-13 DIAGNOSIS — O34219 Maternal care for unspecified type scar from previous cesarean delivery: Secondary | ICD-10-CM | POA: Diagnosis not present

## 2017-10-13 DIAGNOSIS — O24414 Gestational diabetes mellitus in pregnancy, insulin controlled: Secondary | ICD-10-CM | POA: Diagnosis not present

## 2017-10-13 DIAGNOSIS — O99210 Obesity complicating pregnancy, unspecified trimester: Secondary | ICD-10-CM | POA: Diagnosis not present

## 2017-10-13 NOTE — Progress Notes (Signed)
Routine Prenatal Care Visit  Subjective  Mallory Pearson is a 32 y.o. [id number] at [redacted]w[redacted]d being seen today for ongoing prenatal care.  She is currently monitored for the following issues for this high-risk pregnancy and has DEPRESSION; History of cesarean section complicating pregnancy; Short interval between pregnancies affecting pregnancy, antepartum; Maternal obesity, antepartum; High-risk pregnancy supervision, unspecified trimester; Airway stricture; Gestational diabetes; Chronic hypertension with superimposed preeclampsia; Indication for care in labor and delivery, antepartum; Gestational hypertension; and BMI 40.0-44.9, adult ([place]) on their problem list.  ----------------------------------------------------------------------------------- Patient reports: no issues. Denies HA, Visual changes, RUQ pain.  She has not started taking insulin yet.  Was discharged yesterday.  Blood glucose values (normal fasting today, but elevated 2h pp after breakfast). Contractions: Not present. Vag. Bleeding: None.  Movement: Present. Denies leaking of fluid.  [place]: growth 74th %ile with AC>97th %ile.  AFI 12.9 cm.  Currently taking labetalol 200 mg po BID. Home BPs have been normal to slightly elevated (140s/90s) ----------------------------------------------------------------------------------- The following portions of the patient's history were reviewed and updated as appropriate: allergies, current medications, past family history, past medical history, past social history, past surgical history and problem list. Problem list updated.  Objective  Blood pressure (!) 142/94, weight 250 lb (113.4 kg), last menstrual period 02/06/2017, unknown if currently breastfeeding. Pregravid weight 222 lb (100.7 kg) Total Weight Gain 28 lb (12.7 kg) Urinalysis:      Fetal Status: Fetal Heart Rate (bpm): 145   Movement: Present  Presentation: Vertex  General:  Alert, oriented and cooperative. Patient is in no acute  distress.  Skin: Skin is warm and dry. No rash noted.   Cardiovascular: Normal heart rate noted  Respiratory: Normal respiratory effort, no problems with respiration noted  Abdomen: Soft, gravid, appropriate for gestational age. Pain/Pressure: Absent     Pelvic:  Cervical exam deferred        Extremities: Normal range of motion.     Mental Status: Normal mood and affect. Normal behavior. Normal judgment and thought content.   NST: Baseline FHR: 145 beats/min Variability: moderate Accelerations: present Decelerations: absent Tocometry: not done  Interpretation:  INDICATIONS: gestational diabetes mellitus and chronic hypertension with superimposed preeclampsia RESULTS:  A NST procedure was performed with FHR monitoring and a normal baseline established, appropriate time of 20-40 minutes of evaluation, and accels >2 seen w 15x15 characteristics.  Results show a REACTIVE NST.     Assessment   32 y.o. [id number] at [redacted]w[redacted]d by  11/13/2017, by Last Menstrual Period presenting for routine prenatal visit  Plan   pregnancy#4 Problems (from 02/06/17 to present)    Problem Noted Resolved   BMI 40.0-44.9, adult ([place]) 10/14/2017 by Will Bonnet, MD No   Gestational diabetes 09/15/2017 by Malachy Mood, MD No   Overview Addendum 10/10/2017  2:08 PM by Malachy Mood, MD    Current Diabetic Medications:  Glyburide (initially 2.5mg  on 1/10, increased to 5mg  HS on 1/21) then Insulin Lantus 6U qhs and 3 units AC TID has not taken and BG running normal currently [x]  Aspirin 81 mg daily after 12 weeks; discontinue after 36 weeks (? A2/B GDM)  Required Referrals for A1GDM or A2GDM: [x]  Diabetes Education and Testing Supplies [x]  Nutrition Cousult  For A2/B GDM or higher classes of DM [x]  Diabetes Education and Testing Supplies [x]  Nutrition Counsult [x]  Baseline EKG [x]  US fetal growth every 4 weeks starting at 28 weeks [x]  Twice weekly NST starting at [redacted] weeks gestation [ ]  Delivery  planning contingent on fetal  growth, AFI, glycemic control, and other co-morbidities but at least by 39 weeks  Baseline and surveillance labs (pulled in from [facility], refresh links as needed)  Lab Results  Component Value Date   CREATININE 0.58 10/03/2017   AST 24 10/03/2017   ALT 14 10/03/2017   TSH 1.289 10/02/2017   PROTCRRATIO 0.45 (H) 10/02/2017   PROTEIN24HR 384 (H) 10/04/2017   No results found for: HGBA1C  Antenatal Testing Class of DM U/S NST/AFI DELIVERY  Diabetes   A1 - good control - O24.410    A2 - good control - O24.419      A2  - poor control or poor compliance - O24.419, E11.65   (Macrosomia or polyhydramnios) **E11.65 is extra code for poor control**    A2/B - O24.919  and B-C O24.319  Poor control B-C or D-R-F-T - O24.319  or  Type I DM - O24.019  20-38  20-38  20-24-28-32-36   20-24-28-32-35-38//fetal echo  20-24-27-30-33-36-38//fetal echo  40  32//2 x wk  32//2 x wk   32//2 x wk  28//BPP wkly then 32//2 x wk  40  39  PRN   39  PRN          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 by Malachy Mood, MD No   Overview Addendum 07/13/2017  5:07 PM by Malachy Mood, MD    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 [facility]      Maternal obesity, antepartum 04/12/2017 by Malachy Mood, MD No   Overview Signed 04/12/2017  8:53 PM by Malachy Mood, MD    BMI >=40 [x]  early 1h gtt -  [x]  u/s for dating [ ]   [x]  nutritional goals [x]  folic acid 1mg  [x]  bASA (>12 weeks) [x]  consider nutrition consult [x]  consider maternal EKG 1st trimester [x]  Growth u/s 28 [x] , 32 [x] , 36 weeks [x]       High-risk pregnancy supervision, unspecified trimester 04/12/2017 by Malachy Mood, MD No   Overview Addendum 10/10/2017  2:09 PM by Malachy Mood, MD    Clinic [facility] Prenatal Labs  Dating [place] Blood type: A positive  Genetic Screen 1 Screen:    AFP:     Quad:     NIPS: Antibody:Negative  Anatomic [place] WS Rubella: Immune Varicella: Immune  GTT  Early:129    Third trimester:185 3-hr: 133 / 235 / 212 / 148 RPR: NR  Rhogam N/A HBsAg: Negative  TDaP vaccine      09/14/17    Flu Shot: UTD HIV: Negative  Baby Food          BREAST                      GBS:   Contraception  Pap: NIL HPV negative 04/11/17  CBB   Baseline P/C ratio 169mg /dL  CS/VBAC         CS   Support Person    Growth 32w 4lbs 12oz or 2157g c/w74%ile           History of cesarean section complicating pregnancy 03/11/2830 by Malachy Mood, MD No   Overview Signed 04/12/2017  8:52 PM by Malachy Mood, MD    Desires repeat      Short interval between pregnancies affecting pregnancy, antepartum 04/11/2017 by Malachy Mood, MD No   DEPRESSION 07/06/2010 by Owens Loffler, MD No   Overview Signed 10/14/2010  6:18 PM by Interface, Problem List In    Qualifier: Diagnosis of  By: Copland MD, Spencer       Elevated glucose  tolerance test 08/26/2017 by Malachy Mood, MD 10/10/2017 by Malachy Mood, MD   Overview Signed 08/26/2017  1:06 PM by Malachy Mood, MD    185 on 1-hr      Hx of preeclampsia, prior pregnancy, currently pregnant, unspecified trimester 04/11/2017 by Malachy Mood, MD 10/10/2017 by Malachy Mood, MD   Overview Addendum 04/14/2017  1:06 PM by Malachy Mood, MD    [X]  Baseline P/C ratio 169mg /dL [x]  Aspirin 81 mg daily after 12 weeks; discontinue after 36 weeks   Lab Results  Component Value Date   PLT 272 04/11/2017   CREATININE 0.64 04/11/2017   AST 8 04/11/2017   ALT 6 04/11/2017          History of gestational diabetes in prior pregnancy, currently pregnant 04/11/2017 by Malachy Mood, MD 10/10/2017 by Malachy Mood, MD   Overview Signed 04/12/2017  8:52 PM by Malachy Mood, MD    HgbA1C at NOB, early 1-hr ordered         Preterm labor symptoms and general obstetric precautions including but not limited to vaginal bleeding, contractions, leaking of fluid and fetal movement were reviewed in detail with the  patient. Please refer to After Visit Summary for other counseling recommendations.   Encouraged patient to take insulin. She has lantus.  Will have her take 10 units QHS and 3 units with meals now.  She should report these value to use through MyChart, if abnormal and we can make adjustments day-to-day without an appointment.  Keep all other appointments. Let us know if BPs start to rise again. Kick counts encouraged. Continue taking bASA.  Return for Keep previously schedule appointments.  Prentice Docker, MD  10/14/2017 12:55 PM

## 2017-10-14 ENCOUNTER — Encounter: Payer: Self-pay | Admitting: Obstetrics and Gynecology

## 2017-10-14 DIAGNOSIS — Z6841 Body Mass Index (BMI) 40.0 and over, adult: Secondary | ICD-10-CM | POA: Insufficient documentation

## 2017-10-17 ENCOUNTER — Ambulatory Visit (INDEPENDENT_AMBULATORY_CARE_PROVIDER_SITE_OTHER): Payer: 59 | Admitting: Obstetrics and Gynecology

## 2017-10-17 VITALS — BP 146/88 | Wt 249.0 lb

## 2017-10-17 DIAGNOSIS — O24414 Gestational diabetes mellitus in pregnancy, insulin controlled: Secondary | ICD-10-CM

## 2017-10-17 DIAGNOSIS — O0990 Supervision of high risk pregnancy, unspecified, unspecified trimester: Secondary | ICD-10-CM

## 2017-10-17 DIAGNOSIS — Z3A36 36 weeks gestation of pregnancy: Secondary | ICD-10-CM | POA: Diagnosis not present

## 2017-10-17 DIAGNOSIS — O119 Pre-existing hypertension with pre-eclampsia, unspecified trimester: Secondary | ICD-10-CM | POA: Diagnosis not present

## 2017-10-17 DIAGNOSIS — Z3685 Encounter for antenatal screening for Streptococcus B: Secondary | ICD-10-CM | POA: Diagnosis not present

## 2017-10-17 NOTE — Progress Notes (Signed)
ROB NST GBS today

## 2017-10-17 NOTE — Progress Notes (Signed)
Routine Prenatal Care Visit  Subjective  Mallory Pearson is a 32 y.o. H2C9470 at [redacted]w[redacted]d being seen today for ongoing prenatal care.  She is currently monitored for the following issues for this high-risk pregnancy and has DEPRESSION; History of cesarean section complicating pregnancy; Short interval between pregnancies affecting pregnancy, antepartum; Maternal obesity, antepartum; High-risk pregnancy supervision, unspecified trimester; Airway stricture; Gestational diabetes; Chronic hypertension with superimposed preeclampsia; Indication for care in labor and delivery, antepartum; Gestational hypertension; and BMI 40.0-44.9, adult ([place]) on their problem list.  ----------------------------------------------------------------------------------- Patient reports no complaints.   Contractions: Not present. Vag. Bleeding: None.  Movement: Present. Denies leaking of fluid.  ----------------------------------------------------------------------------------- The following portions of the patient's history were reviewed and updated as appropriate: allergies, current medications, past family history, past medical history, past social history, past surgical history and problem list. Problem list updated.   Objective  Blood pressure (!) 146/88, weight 249 lb (112.9 kg), last menstrual period 02/06/2017, unknown if currently breastfeeding. Pregravid weight 222 lb (100.7 kg) Total Weight Gain 27 lb (12.2 kg) Urinalysis: Urine Protein: Trace Urine Glucose: Negative  Fetal Status: Fetal Heart Rate (bpm): 130   Movement: Present  Presentation: Vertex  General:  Alert, oriented and cooperative. Patient is in no acute distress.  Skin: Skin is warm and dry. No rash noted.   Cardiovascular: Normal heart rate noted  Respiratory: Normal respiratory effort, no problems with respiration noted  Abdomen: Soft, gravid, appropriate for gestational age. Pain/Pressure: Absent     Pelvic:  Cervical exam deferred          Extremities: Normal range of motion.     ental Status: Normal mood and affect. Normal behavior. Normal judgment and thought content.   Baseline: 130 Variability: moderate Accelerations: present Decelerations: absent Tocometry: N/A The patient was monitored for 30 minutes, fetal heart rate tracing was deemed reactive, category I tracing,  CPT 59025    Assessment   31 y.o. [id number] at [redacted]w[redacted]d by  11/13/2017, by Last Menstrual Period presenting for routine prenatal visit  Plan   pregnancy#4 Problems (from 02/06/17 to present)    Problem Noted Resolved   High-risk pregnancy supervision, unspecified trimester 04/12/2017 by Malachy Mood, MD No   Priority:  High     Overview Addendum 10/10/2017  2:09 PM by Malachy Mood, MD    [facility]inic [facility] Prenatal Labs  Dating [place] Blood type: A positive  Genetic Screen 1 Screen:    AFP:     Quad:     NIPS: Antibody:Negative  Anatomic [place] WS Rubella: Immune Varicella: Immune  GTT Early:129    Third trimester:185 3-hr: 133 / 235 / 212 / 148 RPR: NR  Rhogam N/A HBsAg: Negative  TDaP vaccine      09/14/17    Flu Shot: UTD HIV: Negative  Baby Food          BREAST                      GBS:   Contraception  Pap: NIL HPV negative 04/11/17  CBB   Baseline P/C ratio 169mg /dL  CS/VBAC         CS   Support Person    Growth 32w 4lbs 12oz or 2157g c/w74%ile           BMI 40.0-44.9, adult ([place]) 10/14/2017 by Will Bonnet, MD No   Gestational diabetes 09/15/2017 by Malachy Mood, MD No   Overview Addendum 10/10/2017  2:08 PM by Malachy Mood, MD  Current Diabetic Medications:  Glyburide (initially 2.5mg  on 1/10, increased to 5mg  HS on 1/21) then Insulin Lantus 30U qhs and 6 units AC TID has not taken and BG running normal currently [x]  Aspirin 81 mg daily after 12 weeks; discontinue after 36 weeks (? A2/B GDM)  Required Referrals for A1GDM or A2GDM: [x]  Diabetes Education and Testing Supplies [x]  Nutrition Cousult  For A2/B GDM or higher  classes of DM [x]  Diabetes Education and Testing Supplies [x]  Nutrition Counsult [x]  Baseline EKG [x]  US fetal growth every 4 weeks starting at 28 weeks [x]  Twice weekly NST starting at [redacted] weeks gestation [ ]  Delivery planning contingent on fetal growth, AFI, glycemic control, and other co-morbidities but at least by 39 weeks  Baseline and surveillance labs (pulled in from [facility], refresh links as needed)  Lab Results  Component Value Date   CREATININE 0.58 10/03/2017   AST 24 10/03/2017   ALT 14 10/03/2017   TSH 1.289 10/02/2017   PROTCRRATIO 0.45 (H) 10/02/2017   PROTEIN24HR 384 (H) 10/04/2017   No results found for: HGBA1C  Antenatal Testing Class of DM U/S NST/AFI DELIVERY  Diabetes   A1 - good control - O24.410    A2 - good control - O24.419      A2  - poor control or poor compliance - O24.419, E11.65   (Macrosomia or polyhydramnios) **E11.65 is extra code for poor control**    A2/B - O24.919  and B-C O24.319  Poor control B-C or D-R-F-T - O24.319  or  Type I DM - O24.019  20-38  20-38  20-24-28-32-36   20-24-28-32-35-38//fetal echo  20-24-27-30-33-36-38//fetal echo  40  32//2 x wk  32//2 x wk   32//2 x wk  28//BPP wkly then 32//2 x wk  40  39  PRN   39  PRN          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 by Malachy Mood, MD No   Overview Addendum 07/13/2017  5:07 PM by Malachy Mood, MD    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 [facility]      Maternal obesity, antepartum 04/12/2017 by Malachy Mood, MD No   Overview Signed 04/12/2017  8:53 PM by Malachy Mood, MD    BMI >=40 [ ]  early 1h gtt -  [ ]  u/s for dating [ ]   [ ]  nutritional goals [ ]  folic acid 1mg  [ ]  bASA (>12 weeks) [ ]  consider nutrition consult [ ]  consider maternal EKG 1st trimester [ ]  Growth u/s 28 [ ] , 32 [ ] , 36 weeks [ ]  [ ]  NST/AFI weekly 36+ weeks (36[] , 37[] , 38[] , 39[] , 40[] ) [ ]  IOL by 41 weeks (scheduled, prn [] )       History of cesarean section  complicating pregnancy 03/10/1024 by Malachy Mood, MD No   Overview Signed 04/12/2017  8:52 PM by Malachy Mood, MD    Desires repeat      Short interval between pregnancies affecting pregnancy, antepartum 04/11/2017 by Malachy Mood, MD No   DEPRESSION 07/06/2010 by Owens Loffler, MD No   Overview Signed 10/14/2010  6:18 PM by Interface, Problem List In    Qualifier: Diagnosis of  By: Copland MD, Spencer        Elevated glucose tolerance test 08/26/2017 by Malachy Mood, MD 10/10/2017 by Malachy Mood, MD   Overview Signed 08/26/2017  1:06 PM by Malachy Mood, MD    185 on 1-hr      Hx of preeclampsia, prior pregnancy, currently pregnant, unspecified trimester 04/11/2017 by  Malachy Mood, MD 10/10/2017 by Malachy Mood, MD   Overview Addendum 04/14/2017  1:06 PM by Malachy Mood, MD    [X]  Baseline P/C ratio 169mg /dL [ ]  Aspirin 81 mg daily after 12 weeks; discontinue after 36 weeks   Lab Results  Component Value Date   PLT 272 04/11/2017   CREATININE 0.64 04/11/2017   AST 8 04/11/2017   ALT 6 04/11/2017          History of gestational diabetes in prior pregnancy, currently pregnant 04/11/2017 by Malachy Mood, MD 10/10/2017 by Malachy Mood, MD   Overview Signed 04/12/2017  8:52 PM by Malachy Mood, MD    HgbA1C at [facility], early 1-hr ordered          Gestational age appropriate obstetric precautions including but not limited to vaginal bleeding, contractions, leaking of fluid and fetal movement were reviewed in detail with the patient.   - GBS today - No headaches, vision changes, weight actually down - Reactive NST - Continue twice weekly NST, AFI next visit - BG at goal on on Lantus 30U qHS and asparte 6 units AC TID  Return in about 3 days (around 10/20/2017) for NST/AFI.  Malachy Mood, MD, [facility], [facility] 10/17/2017, 2:22 PM

## 2017-10-19 LAB — STREP GP B NAA: Strep Gp B NAA: NEGATIVE

## 2017-10-20 ENCOUNTER — Observation Stay
Admission: EM | Admit: 2017-10-20 | Discharge: 2017-10-20 | Disposition: A | Discharge status: Home / Self Care | Payer: 59 | Attending: Certified Nurse Midwife | Admitting: Certified Nurse Midwife

## 2017-10-20 ENCOUNTER — Ambulatory Visit (INDEPENDENT_AMBULATORY_CARE_PROVIDER_SITE_OTHER): Payer: 59 | Admitting: Obstetrics and Gynecology

## 2017-10-20 ENCOUNTER — Telehealth: Payer: Self-pay

## 2017-10-20 ENCOUNTER — Ambulatory Visit (INDEPENDENT_AMBULATORY_CARE_PROVIDER_SITE_OTHER): Payer: 59

## 2017-10-20 VITALS — BP 142/90 | Wt 252.0 lb

## 2017-10-20 DIAGNOSIS — O24414 Gestational diabetes mellitus in pregnancy, insulin controlled: Secondary | ICD-10-CM

## 2017-10-20 DIAGNOSIS — O34219 Maternal care for unspecified type scar from previous cesarean delivery: Secondary | ICD-10-CM

## 2017-10-20 DIAGNOSIS — O119 Pre-existing hypertension with pre-eclampsia, unspecified trimester: Secondary | ICD-10-CM | POA: Diagnosis not present

## 2017-10-20 DIAGNOSIS — O368130 Decreased fetal movements, third trimester, not applicable or unspecified: Principal | ICD-10-CM | POA: Insufficient documentation

## 2017-10-20 DIAGNOSIS — O09899 Supervision of other high risk pregnancies, unspecified trimester: Secondary | ICD-10-CM

## 2017-10-20 DIAGNOSIS — O0990 Supervision of high risk pregnancy, unspecified, unspecified trimester: Secondary | ICD-10-CM

## 2017-10-20 DIAGNOSIS — J386 Stenosis of larynx: Secondary | ICD-10-CM | POA: Insufficient documentation

## 2017-10-20 DIAGNOSIS — O99210 Obesity complicating pregnancy, unspecified trimester: Secondary | ICD-10-CM

## 2017-10-20 DIAGNOSIS — O99513 Diseases of the respiratory system complicating pregnancy, third trimester: Secondary | ICD-10-CM | POA: Insufficient documentation

## 2017-10-20 DIAGNOSIS — Z3A36 36 weeks gestation of pregnancy: Secondary | ICD-10-CM

## 2017-10-20 DIAGNOSIS — J988 Other specified respiratory disorders: Secondary | ICD-10-CM

## 2017-10-20 DIAGNOSIS — Z6841 Body Mass Index (BMI) 40.0 and over, adult: Secondary | ICD-10-CM

## 2017-10-20 DIAGNOSIS — O1493 Unspecified pre-eclampsia, third trimester: Secondary | ICD-10-CM | POA: Insufficient documentation

## 2017-10-20 DIAGNOSIS — O288 Other abnormal findings on antenatal screening of mother: Secondary | ICD-10-CM | POA: Diagnosis present

## 2017-10-20 DIAGNOSIS — Z79899 Other long term (current) drug therapy: Secondary | ICD-10-CM | POA: Insufficient documentation

## 2017-10-20 DIAGNOSIS — Z882 Allergy status to sulfonamides status: Secondary | ICD-10-CM | POA: Insufficient documentation

## 2017-10-20 LAB — FETAL NONSTRESS TEST

## 2017-10-20 MED ORDER — INSULIN ASPART PROT & ASPART (70-30 MIX) 100 UNIT/ML ~~LOC~~ SUSP
6.0000 [IU] | SUBCUTANEOUS | Status: AC
  Administered 2017-10-20: 6 [IU] via SUBCUTANEOUS
  Filled 2017-10-20: qty 10

## 2017-10-20 NOTE — OB Triage Note (Signed)
Mallory Pearson sent over from office for non-reactive NST, decreased fetal movement.

## 2017-10-20 NOTE — Final Progress Note (Signed)
Physician Final Progress Note  Patient ID: Mallory Pearson MRN: [id number] DOB/AGE: [birth year] 32 y.o.  Admit date: 10/20/2017 Admitting provider: Malachy Mood, MD Discharge date: 10/20/2017   Admission Diagnoses: Non-reactive-NST  Discharge Diagnoses:  Active Problems:   Non-reactive NST (non-stress test)  32 year old [id number] at [redacted]w[redacted]d with pregnancy complicated by superimposed preeclampsia based on elevations in blood pressures and increasing proteinuria in the third trimester, subglotic stenosis, insulin dependent GDM, and history of prior cesarean sections who presents for NST in the clinic which was deemed non-reactive.  She presents for prolonged monitoring with reactive tracing and reassuring fetal monitoring.  Blood pressures remained mild range.  The patient is asymptomatic and feeling well.  +FM, no LOf, no VB, no ctx  Consults: None  Significant Findings/ Diagnostic Studies:  No results found.  Procedures:  Baseline: 130 Variability: moderate Accelerations: present Decelerations: absent Tocometry: none The patient was monitored for 30 minutes, fetal heart rate tracing was deemed reactive, category I tracing,  Discharge Condition: good  Disposition: 01-Home or Self Care  Diet: Regular diet  Discharge Activity: Activity as tolerated  Discharge Instructions    Discharge activity:  No Restrictions   Complete by:  As directed    Discharge diet:  No restrictions   Complete by:  As directed    Fetal Kick Count:  Lie on our left side for one hour after a meal, and count the number of times your baby kicks.  If it is less than 5 times, get up, move around and drink some juice.  Repeat the test 30 minutes later.  If it is still less than 5 kicks in an hour, notify your doctor.   Complete by:  As directed    LABOR:  When conractions begin, you should start to time them from the beginning of one contraction to the beginning  of the next.  When contractions are 5 - 10  minutes apart or less and have been regular for at least an hour, you should call your health care provider.   Complete by:  As directed    No sexual activity restrictions   Complete by:  As directed    Notify physician for bleeding from the vagina   Complete by:  As directed    Notify physician for blurring of vision or spots before the eyes   Complete by:  As directed    Notify physician for chills or fever   Complete by:  As directed    Notify physician for fainting spells, "black outs" or loss of consciousness   Complete by:  As directed    Notify physician for increase in vaginal discharge   Complete by:  As directed    Notify physician for leaking of fluid   Complete by:  As directed    Notify physician for pain or burning when urinating   Complete by:  As directed    Notify physician for pelvic pressure (sudden increase)   Complete by:  As directed    Notify physician for severe or continued nausea or vomiting   Complete by:  As directed    Notify physician for sudden gushing of fluid from the vagina (with or without continued leaking)   Complete by:  As directed    Notify physician for sudden, constant, or occasional abdominal pain   Complete by:  As directed    Notify physician if baby moving less than usual   Complete by:  As directed  Allergies as of 10/20/2017      Reactions   Sulfonamide Derivatives Hives      Medication List    STOP taking these medications   insulin detemir 100 UNIT/ML injection Commonly known as:  LEVEMIR     TAKE these medications   albuterol (2.5 MG/3ML) 0.083% nebulizer solution Commonly known as:  PROVENTIL Take 3 mLs (2.5 mg total) by nebulization every 6 (six) hours as needed for wheezing or shortness of breath.   fluticasone 50 MCG/ACT nasal spray Commonly known as:  FLONASE Place 2 sprays into both nostrils daily.   insulin glargine 100 UNIT/ML injection Commonly known as:  LANTUS Inject 30 Units into the skin at  bedtime.   insulin lispro 100 UNIT/ML injection Commonly known as:  HUMALOG Inject 0.06 mLs (6 Units total) into the skin 3 (three) times daily before meals.   INSULIN SYRINGE .5CC/29G 29G X 1/2" 0.5 ML Misc Use as directed   labetalol 100 MG tablet Commonly known as:  NORMODYNE Take 2 tablets (200 mg total) by mouth 2 (two) times daily.   multivitamin-prenatal 27-0.8 MG Tabs tablet Take 1 tablet by mouth daily.   omeprazole 40 MG capsule Commonly known as:  PRILOSEC Take 1 capsule (40 mg total) daily by mouth.   ondansetron 4 MG disintegrating tablet Commonly known as:  ZOFRAN ODT Take 1 tablet (4 mg total) by mouth every 6 (six) hours as needed for nausea.   Spacer/Aero Chamber Mouthpiece Misc 1 Units by Does not apply route every 4 (four) hours as needed (wheezing).        Total time spent taking care of this patient: 30 minutes  Signed: Malachy Mood 10/20/2017, 1:27 PM

## 2017-10-20 NOTE — Telephone Encounter (Signed)
FMLA/DISABILITY forms for [employer] filled out, signature obtained, given to [place] for processing.

## 2017-10-20 NOTE — Progress Notes (Signed)
Routine Prenatal Care Visit  Subjective  Mallory Pearson is a 32 y.o. D3U2025 at [redacted]w[redacted]d being seen today for ongoing prenatal care.  She is currently monitored for the following issues for this high-risk pregnancy and has DEPRESSION; History of cesarean section complicating pregnancy; Short interval between pregnancies affecting pregnancy, antepartum; Maternal obesity, antepartum; High-risk pregnancy supervision, unspecified trimester; Airway stricture; Gestational diabetes; Chronic hypertension with superimposed preeclampsia; Gestational hypertension; BMI 40.0-44.9, adult ([place]); and Non-reactive NST (non-stress test) on their problem list.  ----------------------------------------------------------------------------------- Patient reports diminished fetal movement.  Blood sugar log reviewed- well controlled Lantus in PM and then Novolog 6 units with meals. Denies headache, right upper quadrant pain, and vision changed.   Contractions: Not present. Vag. Bleeding: None.  Movement: Present. Denies leaking of fluid.  ----------------------------------------------------------------------------------- The following portions of the patient's history were reviewed and updated as appropriate: allergies, current medications, past family history, past medical history, past social history, past surgical history and problem list. Problem list updated.   Objective  Blood pressure (!) 142/90, weight 252 lb (114.3 kg), last menstrual period 02/06/2017, unknown if currently breastfeeding. Pregravid weight 222 lb (100.7 kg) Total Weight Gain 30 lb (13.6 kg) Urinalysis: Urine Protein: Trace Urine Glucose: Negative  Fetal Status: Fetal Heart Rate (bpm): 140   Movement: Present  Presentation: Vertex  General:  Alert, oriented and cooperative. Patient is in no acute distress.  Skin: Skin is warm and dry. No rash noted.   Cardiovascular: Normal heart rate noted  Respiratory: Normal respiratory effort, no  problems with respiration noted  Abdomen: Soft, gravid, appropriate for gestational age. Pain/Pressure: Absent     Pelvic:  Cervical exam deferred        Extremities: Normal range of motion.     ental Status: Normal mood and affect. Normal behavior. Normal judgment and thought content.   FHT: 140s, moderate variability, 10x10 accelerations, no decelerations.    Assessment   32 y.o. K2H0623 at [redacted]w[redacted]d by  11/13/2017, by Last Menstrual Period presenting for routine prenatal visit  Plan   pregnancy#4 Problems (from 02/06/17 to present)    Problem Noted Resolved   BMI 40.0-44.9, adult ([place]) 10/14/2017 by Will Bonnet, MD No   Gestational diabetes 09/15/2017 by Malachy Mood, MD No   Overview Addendum 10/17/2017  2:24 PM by Malachy Mood, MD    Current Diabetic Medications:  Glyburide (initially 2.5mg  on 1/10, increased to 5mg  HS on 1/21) then Insulin Lantus 30U qhs and 6 units AC TID  [x]  Aspirin 81 mg daily after 12 weeks; discontinue after 36 weeks (? A2/B GDM)  Required Referrals for A1GDM or A2GDM: [x]  Diabetes Education and Testing Supplies [x]  Nutrition Cousult  For A2/B GDM or higher classes of DM [x]  Diabetes Education and Testing Supplies [x]  Nutrition Counsult [x]  Baseline EKG [x]  US fetal growth every 4 weeks starting at 28 weeks [x]  Twice weekly NST starting at [redacted] weeks gestation [ ]  Delivery planning contingent on fetal growth, AFI, glycemic control, and other co-morbidities but at least by 39 weeks  Baseline and surveillance labs (pulled in from [facility], refresh links as needed)  Lab Results  Component Value Date   CREATININE 0.58 10/03/2017   AST 24 10/03/2017   ALT 14 10/03/2017   TSH 1.289 10/02/2017   PROTCRRATIO 0.45 (H) 10/02/2017   PROTEIN24HR 384 (H) 10/04/2017   No results found for: HGBA1C  Antenatal Testing Class of DM U/S NST/AFI DELIVERY  Diabetes   A1 - good control - O24.410  A2 - good control - O24.419      A2  - poor control or  poor compliance - O24.419, E11.65   (Macrosomia or polyhydramnios) **E11.65 is extra code for poor control**    A2/B - O24.919  and B-C O24.319  Poor control B-C or D-R-F-T - O24.319  or  Type I DM - O24.019  20-38  20-38  20-24-28-32-36   20-24-28-32-35-38//fetal echo  20-24-27-30-33-36-38//fetal echo  40  32//2 x wk  32//2 x wk   32//2 x wk  28//BPP wkly then 32//2 x wk  40  39  PRN   39  PRN          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 by Malachy Mood, MD No   Overview Addendum 07/13/2017  5:07 PM by Malachy Mood, MD    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 [facility]      Maternal obesity, antepartum 04/12/2017 by Malachy Mood, MD No   Overview Signed 04/12/2017  8:53 PM by Malachy Mood, MD    BMI >=40 [ ]  early 1h gtt -  [ ]  u/s for dating [ ]   [ ]  nutritional goals [ ]  folic acid 1mg  [ ]  bASA (>12 weeks) [ ]  consider nutrition consult [ ]  consider maternal EKG 1st trimester [ ]  Growth u/s 28 [ ] , 32 [ ] , 36 weeks [ ]  [ ]  NST/AFI weekly 36+ weeks (36[] , 37[] , 38[] , 39[] , 40[] ) [ ]  IOL by 41 weeks (scheduled, prn [] )       High-risk pregnancy supervision, unspecified trimester 04/12/2017 by Malachy Mood, MD No   Overview Addendum 10/19/2017  1:36 PM by Malachy Mood, MD    Clinic [facility] Prenatal Labs  Dating [place] Blood type: A positive  Genetic Screen 1 Screen:    AFP:     Quad:     NIPS: Antibody:Negative  Anatomic [place] WS Rubella: Immune Varicella: Immune  GTT Early:129    Third trimester:185 3-hr: 133 / 235 / 212 / 148 RPR: NR  Rhogam N/A HBsAg: Negative  TDaP vaccine      09/14/17    Flu Shot: UTD HIV: Negative  Baby Food          BREAST                      [id number]  Contraception  Pap: NIL HPV negative 04/11/17  CBB   Baseline P/C ratio 169mg /dL  CS/VBAC         CS   Support Person    Growth 32w 4lbs 12oz or 2157g c/w74%ile           History of cesarean section complicating pregnancy 10/07/3084 by Malachy Mood, MD  No   Overview Signed 04/12/2017  8:52 PM by Malachy Mood, MD    Desires repeat      Short interval between pregnancies affecting pregnancy, antepartum 04/11/2017 by Malachy Mood, MD No   DEPRESSION 07/06/2010 by Owens Loffler, MD No   Overview Signed 10/14/2010  6:18 PM by Interface, Problem List In    Qualifier: Diagnosis of  By: Copland MD, Spencer        Elevated glucose tolerance test 08/26/2017 by Malachy Mood, MD 10/10/2017 by Malachy Mood, MD   Overview Signed 08/26/2017  1:06 PM by Malachy Mood, MD    185 on 1-hr      Hx of preeclampsia, prior pregnancy, currently pregnant, unspecified trimester 04/11/2017 by Malachy Mood, MD 10/10/2017 by Malachy Mood, MD   Overview Addendum 04/14/2017  1:06  PM by Malachy Mood, MD    [X]  Baseline P/C ratio 169mg /dL [ ]  Aspirin 81 mg daily after 12 weeks; discontinue after 36 weeks   Lab Results  Component Value Date   PLT 272 04/11/2017   CREATININE 0.64 04/11/2017   AST 8 04/11/2017   ALT 6 04/11/2017          History of gestational diabetes in prior pregnancy, currently pregnant 04/11/2017 by Malachy Mood, MD 10/10/2017 by Malachy Mood, MD   Overview Signed 04/12/2017  8:52 PM by Malachy Mood, MD    HgbA1C at [facility], early 1-hr ordered          Gestational age appropriate obstetric precautions including but not limited to vaginal bleeding, contractions, leaking of fluid and fetal movement were reviewed in detail with the patient.    Nonreactive NST, will send to labor and delivery for prolonged monitoring or BPP.  Return in about 4 days (around 10/24/2017) for ROB, i believe already schedules.  Adrian Prows MD  [facility], [facility] 10/20/2017, 7:00 PM

## 2017-10-20 NOTE — Discharge Summary (Signed)
See final progress note. 

## 2017-10-24 ENCOUNTER — Encounter: Payer: 59 | Admitting: Obstetrics & Gynecology

## 2017-10-24 ENCOUNTER — Ambulatory Visit (INDEPENDENT_AMBULATORY_CARE_PROVIDER_SITE_OTHER): Payer: 59 | Admitting: Obstetrics and Gynecology

## 2017-10-24 ENCOUNTER — Other Ambulatory Visit: Payer: Self-pay

## 2017-10-24 ENCOUNTER — Encounter: Payer: Self-pay | Admitting: Obstetrics and Gynecology

## 2017-10-24 VITALS — BP 160/98 | Wt 252.0 lb

## 2017-10-24 DIAGNOSIS — O119 Pre-existing hypertension with pre-eclampsia, unspecified trimester: Secondary | ICD-10-CM | POA: Diagnosis not present

## 2017-10-24 DIAGNOSIS — Z6841 Body Mass Index (BMI) 40.0 and over, adult: Secondary | ICD-10-CM

## 2017-10-24 DIAGNOSIS — O99210 Obesity complicating pregnancy, unspecified trimester: Secondary | ICD-10-CM

## 2017-10-24 DIAGNOSIS — O99213 Obesity complicating pregnancy, third trimester: Secondary | ICD-10-CM

## 2017-10-24 DIAGNOSIS — O0990 Supervision of high risk pregnancy, unspecified, unspecified trimester: Secondary | ICD-10-CM

## 2017-10-24 DIAGNOSIS — Z3A37 37 weeks gestation of pregnancy: Secondary | ICD-10-CM

## 2017-10-24 DIAGNOSIS — O113 Pre-existing hypertension with pre-eclampsia, third trimester: Secondary | ICD-10-CM

## 2017-10-24 DIAGNOSIS — O09899 Supervision of other high risk pregnancies, unspecified trimester: Secondary | ICD-10-CM

## 2017-10-24 DIAGNOSIS — O34219 Maternal care for unspecified type scar from previous cesarean delivery: Secondary | ICD-10-CM | POA: Diagnosis not present

## 2017-10-24 DIAGNOSIS — O24414 Gestational diabetes mellitus in pregnancy, insulin controlled: Secondary | ICD-10-CM

## 2017-10-24 DIAGNOSIS — O99513 Diseases of the respiratory system complicating pregnancy, third trimester: Secondary | ICD-10-CM | POA: Diagnosis not present

## 2017-10-24 DIAGNOSIS — J988 Other specified respiratory disorders: Secondary | ICD-10-CM

## 2017-10-24 DIAGNOSIS — Z3A35 35 weeks gestation of pregnancy: Secondary | ICD-10-CM | POA: Diagnosis not present

## 2017-10-24 NOTE — Progress Notes (Signed)
Routine Prenatal Care Visit  Subjective  Mallory Pearson is a 32 y.o. [id number] at [redacted]w[redacted]d being seen today for ongoing prenatal care.  She is currently monitored for the following issues for this high-risk pregnancy and has DEPRESSION; History of cesarean section complicating pregnancy; Short interval between pregnancies affecting pregnancy, antepartum; Maternal obesity, antepartum; High-risk pregnancy supervision, unspecified trimester; Airway stricture; Gestational diabetes; Chronic hypertension with superimposed preeclampsia; Gestational hypertension; BMI 40.0-44.9, adult ([place]); and Non-reactive NST (non-stress test) on their problem list.  ----------------------------------------------------------------------------------- Patient reports no complaints.  Denies HA, visual changes, RUQ pain. Contractions: Not present. Vag. Bleeding: None.  Movement: Present. Denies leaking of fluid.  GDM: > 50% of values in normal range (only a few aberrant values) Patient takes BP meds at 1030 AM. So, no meds so far today. BP recheck after patient resting > 5 minutes and in correct position reveals acceptable blood pressure ----------------------------------------------------------------------------------- The following portions of the patient's history were reviewed and updated as appropriate: allergies, current medications, past family history, past medical history, past social history, past surgical history and problem list. Problem list updated.  Objective  Blood pressure (!) 160/98, weight 252 lb (114.3 kg), last menstrual period 02/06/2017, unknown if currently breastfeeding. Pregravid weight 222 lb (100.7 kg) Total Weight Gain 30 lb (13.6 kg)  BP RECHECK: 146/96 (patient had not yet taken BP meds this morning) Urinalysis:      Fetal Status: Fetal Heart Rate (bpm): 140   Movement: Present     General:  Alert, oriented and cooperative. Patient is in no acute distress.  Skin: Skin is warm and dry. No rash  noted.   Cardiovascular: Normal heart rate noted  Respiratory: Normal respiratory effort, no problems with respiration noted  Abdomen: Soft, gravid, appropriate for gestational age. Pain/Pressure: Absent     Pelvic:  Cervical exam deferred        Extremities: Normal range of motion.     Mental Status: Normal mood and affect. Normal behavior. Normal judgment and thought content.   NST: Baseline FHR: 140 beats/min Variability: moderate Accelerations: present Decelerations: absent Tocometry: not done  Interpretation:  INDICATIONS: gestational diabetes mellitus and chronic hypertension RESULTS:  A NST procedure was performed with FHR monitoring and a normal baseline established, appropriate time of 20-40 minutes of evaluation, and accels >2 seen w 15x15 characteristics.  Results show a REACTIVE NST.    Assessment   32 y.o. [id number] at [redacted]w[redacted]d by  11/13/2017, by Last Menstrual Period presenting for routine prenatal visit  Plan   pregnancy#4 Problems (from 02/06/17 to present)    Problem Noted Resolved   BMI 40.0-44.9, adult ([place]) 10/14/2017 by Will Bonnet, MD No   Gestational diabetes 09/15/2017 by Malachy Mood, MD No   Overview Addendum 10/17/2017  2:24 PM by Malachy Mood, MD    Current Diabetic Medications:  Glyburide (initially 2.5mg  on 1/10, increased to 5mg  HS on 1/21) then Insulin Lantus 30U qhs and 6 units AC TID  [x]  Aspirin 81 mg daily after 12 weeks; discontinue after 36 weeks (? A2/B GDM)  Required Referrals for A1GDM or A2GDM: [x]  Diabetes Education and Testing Supplies [x]  Nutrition Cousult  For A2/B GDM or higher classes of DM [x]  Diabetes Education and Testing Supplies [x]  Nutrition Counsult [x]  Baseline EKG [x]  US fetal growth every 4 weeks starting at 28 weeks [x]  Twice weekly NST starting at [redacted] weeks gestation  Baseline and surveillance labs (pulled in from [facility], refresh links as needed)  Lab Results  Component Value Date  CREATININE 0.58  10/03/2017   AST 24 10/03/2017   ALT 14 10/03/2017   TSH 1.289 10/02/2017   PROTCRRATIO 0.45 (H) 10/02/2017   PROTEIN24HR 384 (H) 10/04/2017   No results found for: HGBA1C  Antenatal Testing Class of DM U/S NST/AFI DELIVERY  Diabetes   A1 - good control - O24.410    A2 - good control - O24.419      A2  - poor control or poor compliance - O24.419, E11.65   (Macrosomia or polyhydramnios) **E11.65 is extra code for poor control**    A2/B - O24.919  and B-C O24.319  Poor control B-C or D-R-F-T - O24.319  or  Type I DM - O24.019  20-38  20-38  20-24-28-32-36   20-24-28-32-35-38//fetal echo  20-24-27-30-33-36-38//fetal echo  40  32//2 x wk  32//2 x wk   32//2 x wk  28//BPP wkly then 32//2 x wk  40  39  PRN   39  PRN          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 by Malachy Mood, MD No   Overview Addendum 07/13/2017  5:07 PM by Malachy Mood, MD    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 [place] #6 ET tube, if necessary      Maternal obesity, antepartum 04/12/2017 by Malachy Mood, MD No   Overview Signed 04/12/2017  8:53 PM by Malachy Mood, MD    BMI >=40 [x]  early 1h gtt -  [x]  u/s for dating [ ]   [x]  nutritional goals [x]  folic acid 1mg  [x]  bASA (>12 weeks) [x]  consider nutrition consult [x]  consider maternal EKG 1st trimester [x]  Growth u/s 28 [x] , 32 [x] , 36 weeks [x]  [x]  NST/AFI weekly 36+ weeks (36[] , 37[] , 38[] , 39[] , 40[] )      High-risk pregnancy supervision, unspecified trimester 04/12/2017 by Malachy Mood, MD No   Overview Addendum 10/19/2017  1:36 PM by Malachy Mood, MD    Clinic [facility] Prenatal Labs  Dating [place] Blood type: A positive  Genetic Screen 1 Screen:    AFP:     Quad:     NIPS: Antibody:Negative  Anatomic [place] WS Rubella: Immune Varicella: Immune  GTT Early:129    Third trimester:185 3-hr: 133 / 235 / 212 / 148 RPR: NR  Rhogam N/A HBsAg: Negative  TDaP vaccine      09/14/17    Flu Shot: UTD HIV: Negative   Baby Food          BREAST                      [id number]  Contraception  Pap: NIL HPV negative 04/11/17  CBB   Baseline P/C ratio 169mg /dL  CS/VBAC         CS   Support Person    Growth 32w 4lbs 12oz or 2157g c/w74%ile         History of cesarean section complicating pregnancy 4/0/3474 by Malachy Mood, MD No   Overview Signed 04/12/2017  8:52 PM by Malachy Mood, MD    Desires repeat - scheduled for this Thursday, 2/21      Short interval between pregnancies affecting pregnancy, antepartum 04/11/2017 by Malachy Mood, MD No   DEPRESSION 07/06/2010 by Owens Loffler, MD No   Overview Signed 10/14/2010  6:18 PM by Interface, Problem List In    Qualifier: Diagnosis of  By: Lorelei Pont MD, Mallory       Elevated glucose tolerance test 08/26/2017 by Malachy Mood, MD 10/10/2017 by Malachy Mood, MD   Overview  Signed 08/26/2017  1:06 PM by Malachy Mood, MD    185 on 1-hr     Hx of preeclampsia, prior pregnancy, currently pregnant, unspecified trimester 04/11/2017 by Malachy Mood, MD 10/10/2017 by Malachy Mood, MD   Overview Addendum 04/14/2017  1:06 PM by Malachy Mood, MD    [X]  Baseline P/C ratio 169mg /dL [x]  Aspirin 81 mg daily after 12 weeks; discontinue after 36 weeks  Lab Results  Component Value Date   PLT 272 04/11/2017   CREATININE 0.64 04/11/2017   AST 8 04/11/2017   ALT 6 04/11/2017       History of gestational diabetes in prior pregnancy, currently pregnant 04/11/2017 by Malachy Mood, MD 10/10/2017 by Malachy Mood, MD   Overview Signed 04/12/2017  8:52 PM by Malachy Mood, MD    HgbA1C at [facility], early 1-hr ordered         Term labor symptoms and general obstetric precautions including but not limited to vaginal bleeding, contractions, leaking of fluid and fetal movement were reviewed in detail with the patient. Precautions for headache, visual changes, RUQ pain and elevated BPs >160 (SBP) and >110 (DBP) on medication. -Continue  current dose of insulin -Continue current dose of labetalol  Please refer to After Visit Summary for other counseling recommendations.   Keep appointment on 2/20 with Dr Georgianne Fick for pre-op.   Prentice Docker, MD  10/24/2017 1:17 PM

## 2017-10-26 ENCOUNTER — Ambulatory Visit (INDEPENDENT_AMBULATORY_CARE_PROVIDER_SITE_OTHER): Payer: 59 | Admitting: Obstetrics and Gynecology

## 2017-10-26 ENCOUNTER — Encounter: Payer: Self-pay | Admitting: Obstetrics and Gynecology

## 2017-10-26 ENCOUNTER — Other Ambulatory Visit: Payer: Self-pay

## 2017-10-26 ENCOUNTER — Encounter
Admission: RE | Admit: 2017-10-26 | Discharge: 2017-10-26 | Disposition: A | Discharge status: Home / Self Care | Payer: 59 | Source: Ambulatory Visit | Attending: Obstetrics and Gynecology | Admitting: Obstetrics and Gynecology

## 2017-10-26 VITALS — BP 148/96 | HR 123 | Ht 67.0 in | Wt 252.0 lb

## 2017-10-26 DIAGNOSIS — O99210 Obesity complicating pregnancy, unspecified trimester: Secondary | ICD-10-CM

## 2017-10-26 DIAGNOSIS — O1002 Pre-existing essential hypertension complicating childbirth: Secondary | ICD-10-CM | POA: Diagnosis not present

## 2017-10-26 DIAGNOSIS — O34219 Maternal care for unspecified type scar from previous cesarean delivery: Secondary | ICD-10-CM

## 2017-10-26 DIAGNOSIS — O24424 Gestational diabetes mellitus in childbirth, insulin controlled: Secondary | ICD-10-CM | POA: Diagnosis not present

## 2017-10-26 DIAGNOSIS — O99214 Obesity complicating childbirth: Secondary | ICD-10-CM | POA: Diagnosis not present

## 2017-10-26 DIAGNOSIS — D62 Acute posthemorrhagic anemia: Secondary | ICD-10-CM | POA: Diagnosis not present

## 2017-10-26 DIAGNOSIS — O24414 Gestational diabetes mellitus in pregnancy, insulin controlled: Secondary | ICD-10-CM

## 2017-10-26 DIAGNOSIS — O34211 Maternal care for low transverse scar from previous cesarean delivery: Secondary | ICD-10-CM | POA: Diagnosis not present

## 2017-10-26 DIAGNOSIS — O114 Pre-existing hypertension with pre-eclampsia, complicating childbirth: Secondary | ICD-10-CM | POA: Diagnosis not present

## 2017-10-26 DIAGNOSIS — O9081 Anemia of the puerperium: Secondary | ICD-10-CM | POA: Diagnosis not present

## 2017-10-26 DIAGNOSIS — O119 Pre-existing hypertension with pre-eclampsia, unspecified trimester: Secondary | ICD-10-CM

## 2017-10-26 DIAGNOSIS — O09899 Supervision of other high risk pregnancies, unspecified trimester: Secondary | ICD-10-CM

## 2017-10-26 DIAGNOSIS — E669 Obesity, unspecified: Secondary | ICD-10-CM | POA: Diagnosis not present

## 2017-10-26 DIAGNOSIS — Z3A37 37 weeks gestation of pregnancy: Secondary | ICD-10-CM

## 2017-10-26 DIAGNOSIS — O26893 Other specified pregnancy related conditions, third trimester: Secondary | ICD-10-CM | POA: Diagnosis not present

## 2017-10-26 DIAGNOSIS — J988 Other specified respiratory disorders: Secondary | ICD-10-CM

## 2017-10-26 HISTORY — DX: Gastro-esophageal reflux disease without esophagitis: K21.9

## 2017-10-26 HISTORY — DX: Stenosis of larynx: J38.6

## 2017-10-26 LAB — COMPREHENSIVE METABOLIC PANEL
ALT: 12 U/L — ABNORMAL LOW (ref 14–54)
AST: 20 U/L (ref 15–41)
Albumin: 3.3 g/dL — ABNORMAL LOW (ref 3.5–5.0)
Alkaline Phosphatase: 112 U/L (ref 38–126)
Anion gap: 10 (ref 5–15)
BUN: 16 mg/dL (ref 6–20)
CHLORIDE: 105 mmol/L (ref 101–111)
CO2: 20 mmol/L — ABNORMAL LOW (ref 22–32)
Calcium: 9.2 mg/dL (ref 8.9–10.3)
Creatinine, Ser: 0.54 mg/dL (ref 0.44–1.00)
Glucose, Bld: 80 mg/dL (ref 65–99)
Potassium: 4.1 mmol/L (ref 3.5–5.1)
Sodium: 135 mmol/L (ref 135–145)
Total Bilirubin: 0.7 mg/dL (ref 0.3–1.2)
Total Protein: 6.9 g/dL (ref 6.5–8.1)

## 2017-10-26 LAB — CBC
HCT: 38.8 % (ref 35.0–47.0)
Hemoglobin: 13 g/dL (ref 12.0–16.0)
MCH: 27.6 pg (ref 26.0–34.0)
MCHC: 33.6 g/dL (ref 32.0–36.0)
MCV: 82.1 fL (ref 80.0–100.0)
PLATELETS: 188 10*3/uL (ref 150–440)
RBC: 4.73 MIL/uL (ref 3.80–5.20)
RDW: 14.6 % — AB (ref 11.5–14.5)
WBC: 11.7 10*3/uL — ABNORMAL HIGH (ref 3.6–11.0)

## 2017-10-26 LAB — TYPE AND SCREEN
ABO/RH(D): A POS
ANTIBODY SCREEN: NEGATIVE
EXTEND SAMPLE REASON: UNDETERMINED

## 2017-10-26 NOTE — Progress Notes (Signed)
Obstetric H&P   Chief Complaint: Repeat C-section  Prenatal Care Provider: [facility]  History of Present Illness: 32 y.o. [id number] [redacted]w[redacted]d by 11/13/2017, by Last Menstrual Period presenting for preop H&P for repeat Cesarean section.  +FM, no LOF, no VB, no ctx. 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    Pregravid weight 222 lb (100.7 kg) Total Weight Gain 30 lb (13.6 kg)  pregnancy#4 Problems (from 02/06/17 to present)    Problem Noted Resolved   High-risk pregnancy supervision, unspecified trimester 04/12/2017 by Malachy Mood, MD No   Priority:  High     Overview Addendum 10/19/2017  1:36 PM by Malachy Mood, MD    [facility] Prenatal Labs  Dating [place] Blood type: A positive  Genetic Screen 1 Screen:    AFP:     Quad:     NIPS: Antibody:Negative  Anatomic [place] WS Rubella: Immune Varicella: Immune  GTT Early:129    Third trimester:185 3-hr: 133 / 235 / 212 / 148 RPR: NR  Rhogam N/A HBsAg: Negative  TDaP vaccine      09/14/17    Flu Shot: UTD HIV: Negative  Baby Food          BREAST                      AJO:INOMVEHM  Contraception  Pap: NIL HPV negative 04/11/17  CBB   Baseline P/C ratio 169mg /dL  CS/VBAC         CS   Support Person    Growth 32w 4lbs 12oz or 2157g c/w74%ile           BMI 40.0-44.9, adult ([place]) 10/14/2017 by Will Bonnet, MD No   Gestational diabetes 09/15/2017 by Malachy Mood, MD No   Overview Addendum 10/17/2017  2:24 PM by Malachy Mood, MD    Current Diabetic Medications:  Glyburide (initially 2.5mg  on 1/10, increased to 5mg  HS on 1/21) then Insulin Lantus 30U qhs and 6 units AC TID  [x]  Aspirin 81 mg daily after 12 weeks; discontinue after 36 weeks (? A2/B GDM)  Required Referrals for A1GDM or A2GDM: [x]  Diabetes Education and Testing Supplies [x]  Nutrition Cousult  For A2/B GDM or higher classes of DM [x]  Diabetes Education and  Testing Supplies [x]  Nutrition Counsult [x]  Baseline EKG [x]  US fetal growth every 4 weeks starting at 28 weeks [x]  Twice weekly NST starting at [redacted] weeks gestation [ ]  Delivery planning contingent on fetal growth, AFI, glycemic control, and other co-morbidities but at least by 39 weeks  Baseline and surveillance labs (pulled in from [facility], refresh links as needed)  Lab Results  Component Value Date   CREATININE 0.58 10/03/2017   AST 24 10/03/2017   ALT 14 10/03/2017   TSH 1.289 10/02/2017   PROTCRRATIO 0.45 (H) 10/02/2017   PROTEIN24HR 384 (H) 10/04/2017   No results found for: HGBA1C  Antenatal Testing Class of DM U/S NST/AFI DELIVERY  Diabetes   A1 - good control - O24.410    A2 - good control - O24.419      A2  - poor control or poor compliance - O24.419, E11.65   (Macrosomia or polyhydramnios) **E11.65 is extra code for poor control**    A2/B - O24.919  and B-C O24.319  Poor control B-C or D-R-F-T - O24.319  or  Type I DM - O24.019  20-38  20-38  20-24-28-32-36   20-24-28-32-35-38//fetal echo  20-24-27-30-33-36-38//fetal  echo  40  32//2 x wk  32//2 x wk   32//2 x wk  28//BPP wkly then 32//2 x wk  40  39  PRN   39  PRN          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 by Malachy Mood, MD No   Overview Addendum 07/13/2017  5:07 PM by Malachy Mood, MD    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 [facility]      Maternal obesity, antepartum 04/12/2017 by Malachy Mood, MD No   Overview Signed 04/12/2017  8:53 PM by Malachy Mood, MD    BMI >=40 [ ]  early 1h gtt -  [ ]  u/s for dating [ ]   [ ]  nutritional goals [ ]  folic acid 1mg  [ ]  bASA (>12 weeks) [ ]  consider nutrition consult [ ]  consider maternal EKG 1st trimester [ ]  Growth u/s 28 [ ] , 32 [ ] , 36 weeks [ ]  [ ]  NST/AFI weekly 36+ weeks (36[] , 37[] , 38[] , 39[] , 40[] ) [ ]  IOL by 41 weeks (scheduled, prn [] )       History of cesarean section complicating pregnancy 09/07/9415 by Malachy Mood, MD No   Overview Signed 04/12/2017  8:52 PM by Malachy Mood, MD    Desires repeat      Short interval between pregnancies affecting pregnancy, antepartum 04/11/2017 by Malachy Mood, MD No   DEPRESSION 07/06/2010 by Owens Loffler, MD No   Overview Signed 10/14/2010  6:18 PM by Interface, Problem List In    Qualifier: Diagnosis of  By: Copland MD, Spencer        Elevated glucose tolerance test 08/26/2017 by Malachy Mood, MD 10/10/2017 by Malachy Mood, MD   Overview Signed 08/26/2017  1:06 PM by Malachy Mood, MD    185 on 1-hr      Hx of preeclampsia, prior pregnancy, currently pregnant, unspecified trimester 04/11/2017 by Malachy Mood, MD 10/10/2017 by Malachy Mood, MD   Overview Addendum 04/14/2017  1:06 PM by Malachy Mood, MD    [X]  Baseline P/C ratio 169mg /dL [ ]  Aspirin 81 mg daily after 12 weeks; discontinue after 36 weeks   Lab Results  Component Value Date   PLT 272 04/11/2017   CREATININE 0.64 04/11/2017   AST 8 04/11/2017   ALT 6 04/11/2017          History of gestational diabetes in prior pregnancy, currently pregnant 04/11/2017 by Malachy Mood, MD 10/10/2017 by Malachy Mood, MD   Overview Signed 04/12/2017  8:52 PM by Malachy Mood, MD    HgbA1C at [facility], early 1-hr ordered          Review of Systems: 10 point review of systems negative unless otherwise noted in HPI  Past Medical History: Past Medical History:  Diagnosis Date  . Allergy   . Gestational diabetes   . History of chicken pox   . Hypertension   . Tachycardia     Past Surgical History: Past Surgical History:  Procedure Laterality Date  . CESAREAN SECTION  11/26/11  . CESAREAN SECTION N/A 04/01/2016   Procedure: CESAREAN SECTION;  Surgeon: Gae Dry, MD;  Location: [facility] ORS;  Service: Obstetrics;  Laterality: N/A;  Time of birth 18:50 Sex: Female Weight: 7 lbs, 13 oz   . KNEE SURGERY  2005  . LARYNX SURGERY    . TONSILLECTOMY       Past Obstetric History: #: 1, Date: None, Sex: None, Weight: None, GA: None, Delivery: None, Apgar1: None, Apgar5: None, Living: None, Birth Comments: None  #:  2, Date: 11/26/11, Sex: Female, Weight: 5 lb 14 oz (2.665 kg), GA: [redacted]w[redacted]d, Delivery: C-Section, Low Transverse, Apgar1: None, Apgar5: None, Living: Living, Birth Comments: None  #: 3, Date: 04/01/16, Sex: Female, Weight: 7 lb 12.9 oz (3.54 kg), GA: [redacted]w[redacted]d, Delivery: C-Section, Low Transverse, Apgar1: 7, Apgar5: 8, Living: Living, Birth Comments: None  #: 4, Date: None, Sex: None, Weight: None, GA: None, Delivery: None, Apgar1: None, Apgar5: None, Living: None, Birth Comments: None   Family History: No pertinent family history  Social History: Social History   Socioeconomic History  . Marital status: Married    Spouse name: Not on file  . Number of children: Not on file  . Years of education: Not on file  . Highest education level: Not on file  Social Needs  . Financial resource strain: Not on file  . Food insecurity - worry: Not on file  . Food insecurity - inability: Not on file  . Transportation needs - medical: Not on file  . Transportation needs - non-medical: Not on file  Occupational History  . Occupation: cna    Employer: [employer]  Tobacco Use  . Smoking status: Never Smoker  . Smokeless tobacco: Never Used  Substance and Sexual Activity  . Alcohol use: No    Alcohol/week: 0.0 oz  . Drug use: No  . Sexual activity: Yes  Other Topics Concern  . Not on file  Social History Narrative   Regular exercise--yes    Medications: Prior to Admission medications   Medication Sig Start Date End Date Taking? Authorizing Provider  insulin glargine (LANTUS) 100 UNIT/ML injection Inject 30 Units into the skin at bedtime.    [provider]  insulin lispro (HUMALOG) 100 UNIT/ML injection Inject 0.06 mLs (6 Units total) into the skin 3 (three) times daily before meals. 10/04/17   Malachy Mood, MD  INSULIN  SYRINGE .5CC/29G 29G X 1/2" 0.5 ML MISC Use as directed 10/04/17   Malachy Mood, MD  labetalol (NORMODYNE) 100 MG tablet Take 2 tablets (200 mg total) by mouth 2 (two) times daily. 10/12/17   Homero Fellers, MD  omeprazole (PRILOSEC) 40 MG capsule Take 1 capsule (40 mg total) daily by mouth. 07/26/17 07/26/18  Malachy Mood, MD  ondansetron (ZOFRAN ODT) 4 MG disintegrating tablet Take 1 tablet (4 mg total) by mouth every 6 (six) hours as needed for nausea. 10/04/17   Malachy Mood, MD  Prenatal Vit-Fe Fumarate-FA (MULTIVITAMIN-PRENATAL) 27-0.8 MG TABS Take 1 tablet by mouth daily.     [provider]    Allergies: Allergies  Allergen Reactions  . Sulfonamide Derivatives Hives    Physical Exam: Vitals: Blood pressure (!) 148/96, pulse (!) 123, height 5\' 7"  (1.702 m), weight 252 lb (114.3 kg), last menstrual period 02/06/2017, unknown if currently breastfeeding.  FHT: 140  General: NAD HEENT: normocephalic, anicteric Pulmonary: No increased work of breathing Cardiovascular: RRR, distal pulses 2+ Abdomen: Gravid, non-tender Leopolds: vtx Genitourinary: deferred Extremities: no edema, erythema, or tenderness Neurologic: Grossly intact Psychiatric: mood appropriate, affect full  Labs: No results found for this or any previous visit (from the past 24 hour(s)).  Assessment: 32 y.o. [id number] [redacted]w[redacted]d by 11/13/2017, by Last Menstrual Period presenting for repeat Cesarean section  Plan: 1) The patient was counseled regarding risk and benefits to proceeding with Cesarean section to expedite delivery.  Risk of cesarean section were discussed including risk of bleeding and need for potential intraoperative or postoperative blood transfusion with a rate of approximately 5% quoted for  all Cesarean sections, risk of injury to adjacent organs including but not limited to bowl and bladder, the need for additional surgical procedures to address such injuries, and the risk of  infection.    2) Fetus - positive FHT  3) PNL - see HPI  4) Immunization History -  Immunization History  Administered Date(s) Administered  . Tdap 09/14/2017  Influenza received at work  5) Disposition - pending delivery  Malachy Mood, MD, [place] 10/26/2017, 9:25 AM

## 2017-10-26 NOTE — Patient Instructions (Signed)
Your procedure is scheduled on: October 27, 2017 thursday Report to [facility] AT 5:30 AM   REMEMBER: Instructions that are not followed completely may result in serious medical risk, up to and including death; or upon the discretion of your surgeon and anesthesiologist your surgery may need to be rescheduled.  Do not eat food after midnight the night before your procedure.  No gum chewing or hard candies.  You may however, drink CLEAR liquids up to 2 hours before you are scheduled to arrive at the hospital for your procedure.  Do not drink clear liquids within 2 hours of the start of your surgery.  Clear liquids include: - water   Do NOT drink anything that is not on this list.  Type 1 and Type 2 diabetics should only drink water.  No Alcohol for 24 hours before or after surgery.  No Smoking including e-cigarettes for 24 hours prior to surgery. No chewable tobacco products for at least 6 hours prior to surgery. No nicotine patches on the day of surgery.  On the morning of surgery brush your teeth with toothpaste and water, you may rinse your mouth with mouthwash if you wish. Do not swallow any  toothpaste or mouthwash.  Notify your doctor if there is any change in your medical condition (cold, fever, infection).  Do not wear jewelry, make-up, hairpins, clips or nail polish.  Do not wear lotions, powders, or perfumes. You may  NOT wear deodorant.  Do not shave 48 hours prior to surgery. Men may shave face and neck.  Contacts and dentures may not be worn into surgery.  Do not bring valuables to the hospital. [facility] is not responsible for any belongings or valuables.   TAKE THESE MEDICATIONS THE MORNING OF SURGERY WITH A SIP OF WATER: NONE   Use wipes as directed on instruction sheet.  Take 1/2 of usual insulin dose the night before surgery and none on the morning of surgery.  Follow recommendations from Cardiologist, Pulmonologist or PCP regarding  stopping Aspirin, Coumadin, Plavix, Eliquis, Pradaxa, or Pletal.  Stop Anti-inflammatories such as Advil, Aleve, Ibuprofen, Motrin, Naproxen, Naprosyn, Goodie powder, or aspirin products. (May take Tylenol or Acetaminophen if needed.)  Stop ANY OVER THE COUNTER supplements until after surgery. (May continue Vitamin D, Vitamin B, and multivitamin.)  If you are being admitted to the hospital overnight, leave your suitcase in the car. After surgery it may be brought to your room.  If you are being discharged the day of surgery, you will not be allowed to drive home. You will need someone to drive you home and stay with you that night.   If you are taking public transportation, you will need to have a responsible adult to with you.  Please call the number above if you have any questions about these instructions.

## 2017-10-27 ENCOUNTER — Inpatient Hospital Stay
Admission: RE | Admit: 2017-10-27 | Discharge: 2017-10-30 | DRG: 787 | Disposition: A | Discharge status: Home / Self Care | Payer: 59 | Source: Ambulatory Visit | Attending: Obstetrics and Gynecology | Admitting: Obstetrics and Gynecology

## 2017-10-27 ENCOUNTER — Encounter
Admission: RE | Disposition: A | Discharge status: Home / Self Care | Payer: Self-pay | Source: Ambulatory Visit | Attending: Obstetrics and Gynecology

## 2017-10-27 ENCOUNTER — Inpatient Hospital Stay: Payer: 59 | Admitting: Registered Nurse

## 2017-10-27 ENCOUNTER — Inpatient Hospital Stay: Admit: 2017-10-27 | Payer: 59 | Admitting: Obstetrics and Gynecology

## 2017-10-27 DIAGNOSIS — O114 Pre-existing hypertension with pre-eclampsia, complicating childbirth: Secondary | ICD-10-CM | POA: Diagnosis present

## 2017-10-27 DIAGNOSIS — O99214 Obesity complicating childbirth: Secondary | ICD-10-CM | POA: Diagnosis present

## 2017-10-27 DIAGNOSIS — O0990 Supervision of high risk pregnancy, unspecified, unspecified trimester: Secondary | ICD-10-CM

## 2017-10-27 DIAGNOSIS — E669 Obesity, unspecified: Secondary | ICD-10-CM | POA: Diagnosis present

## 2017-10-27 DIAGNOSIS — O9962 Diseases of the digestive system complicating childbirth: Secondary | ICD-10-CM | POA: Diagnosis present

## 2017-10-27 DIAGNOSIS — O24424 Gestational diabetes mellitus in childbirth, insulin controlled: Secondary | ICD-10-CM | POA: Diagnosis not present

## 2017-10-27 DIAGNOSIS — J386 Stenosis of larynx: Secondary | ICD-10-CM | POA: Diagnosis present

## 2017-10-27 DIAGNOSIS — O34211 Maternal care for low transverse scar from previous cesarean delivery: Principal | ICD-10-CM | POA: Diagnosis present

## 2017-10-27 DIAGNOSIS — O9081 Anemia of the puerperium: Secondary | ICD-10-CM | POA: Diagnosis not present

## 2017-10-27 DIAGNOSIS — Z6841 Body Mass Index (BMI) 40.0 and over, adult: Secondary | ICD-10-CM

## 2017-10-27 DIAGNOSIS — O09899 Supervision of other high risk pregnancies, unspecified trimester: Secondary | ICD-10-CM

## 2017-10-27 DIAGNOSIS — O134 Gestational [pregnancy-induced] hypertension without significant proteinuria, complicating childbirth: Secondary | ICD-10-CM | POA: Diagnosis not present

## 2017-10-27 DIAGNOSIS — D62 Acute posthemorrhagic anemia: Secondary | ICD-10-CM | POA: Diagnosis not present

## 2017-10-27 DIAGNOSIS — O26893 Other specified pregnancy related conditions, third trimester: Secondary | ICD-10-CM | POA: Diagnosis present

## 2017-10-27 DIAGNOSIS — O34219 Maternal care for unspecified type scar from previous cesarean delivery: Secondary | ICD-10-CM | POA: Diagnosis not present

## 2017-10-27 DIAGNOSIS — J988 Other specified respiratory disorders: Secondary | ICD-10-CM

## 2017-10-27 DIAGNOSIS — Z3A37 37 weeks gestation of pregnancy: Secondary | ICD-10-CM | POA: Diagnosis not present

## 2017-10-27 DIAGNOSIS — O1002 Pre-existing essential hypertension complicating childbirth: Secondary | ICD-10-CM | POA: Diagnosis present

## 2017-10-27 DIAGNOSIS — F329 Major depressive disorder, single episode, unspecified: Secondary | ICD-10-CM | POA: Diagnosis not present

## 2017-10-27 DIAGNOSIS — K219 Gastro-esophageal reflux disease without esophagitis: Secondary | ICD-10-CM | POA: Diagnosis present

## 2017-10-27 DIAGNOSIS — O99210 Obesity complicating pregnancy, unspecified trimester: Secondary | ICD-10-CM

## 2017-10-27 LAB — RPR: RPR: NONREACTIVE

## 2017-10-27 LAB — ABO/RH: ABO/RH(D): A POS

## 2017-10-27 LAB — GLUCOSE, CAPILLARY
GLUCOSE-CAPILLARY: 107 mg/dL — AB (ref 65–99)
GLUCOSE-CAPILLARY: 124 mg/dL — AB (ref 65–99)
GLUCOSE-CAPILLARY: 91 mg/dL (ref 65–99)
Glucose-Capillary: 67 mg/dL (ref 65–99)

## 2017-10-27 SURGERY — Surgical Case
Anesthesia: Spinal

## 2017-10-27 MED ORDER — NALBUPHINE HCL 10 MG/ML IJ SOLN: 5.0000 mg | INTRAMUSCULAR | Status: DC | PRN

## 2017-10-27 MED ORDER — KETOROLAC TROMETHAMINE 30 MG/ML IJ SOLN
30.0000 mg | Freq: Four times a day (QID) | INTRAMUSCULAR | Status: AC
  Administered 2017-10-27 – 2017-10-28 (×4): 30 mg via INTRAVENOUS
  Filled 2017-10-27 (×2): qty 1

## 2017-10-27 MED ORDER — SENNOSIDES-DOCUSATE SODIUM 8.6-50 MG PO TABS
2.0000 | ORAL_TABLET | ORAL | Status: DC
  Administered 2017-10-27 – 2017-10-29 (×3): 2 via ORAL
  Filled 2017-10-27 (×3): qty 2

## 2017-10-27 MED ORDER — SODIUM CHLORIDE 0.9 % IV BOLUS (SEPSIS)
1000.0000 mL | Freq: Once | INTRAVENOUS | Status: AC
  Administered 2017-10-27: 1000 mL via INTRAVENOUS

## 2017-10-27 MED ORDER — BUPIVACAINE IN DEXTROSE 0.75-8.25 % IT SOLN
INTRATHECAL | Status: DC | PRN
  Administered 2017-10-27: 1.8 mL via INTRATHECAL

## 2017-10-27 MED ORDER — DIPHENHYDRAMINE HCL 50 MG/ML IJ SOLN: 12.5000 mg | INTRAMUSCULAR | Status: DC | PRN

## 2017-10-27 MED ORDER — OXYCODONE HCL 5 MG PO TABS
10.0000 mg | ORAL_TABLET | ORAL | Status: DC | PRN
Start: 2017-10-27 — End: 2017-10-30
  Administered 2017-10-28: 10 mg via ORAL
  Filled 2017-10-27: qty 2

## 2017-10-27 MED ORDER — KETOROLAC TROMETHAMINE 30 MG/ML IJ SOLN
INTRAMUSCULAR | Status: AC
  Administered 2017-10-27: 30 mg via INTRAVENOUS
  Filled 2017-10-27: qty 1

## 2017-10-27 MED ORDER — DIBUCAINE 1 % RE OINT: 1.0000 "application " | TOPICAL_OINTMENT | RECTAL | Status: DC | PRN

## 2017-10-27 MED ORDER — ONDANSETRON HCL 4 MG/2ML IJ SOLN: 4.0000 mg | Freq: Three times a day (TID) | INTRAMUSCULAR | Status: DC | PRN

## 2017-10-27 MED ORDER — WITCH HAZEL-GLYCERIN EX PADS: 1.0000 "application " | MEDICATED_PAD | CUTANEOUS | Status: DC | PRN

## 2017-10-27 MED ORDER — BUPIVACAINE ON-Q PAIN PUMP (FOR ORDER SET NO CHG)
INJECTION | Status: AC
  Filled 2017-10-27: qty 1

## 2017-10-27 MED ORDER — NALOXONE HCL 0.4 MG/ML IJ SOLN: 0.4000 mg | INTRAMUSCULAR | Status: DC | PRN

## 2017-10-27 MED ORDER — BUPIVACAINE HCL (PF) 0.5 % IJ SOLN
INTRAMUSCULAR | Status: DC | PRN
  Administered 2017-10-27: 10 mL

## 2017-10-27 MED ORDER — LACTATED RINGERS IV SOLN: INTRAVENOUS | Status: DC

## 2017-10-27 MED ORDER — BUPIVACAINE 0.25 % ON-Q PUMP DUAL CATH 400 ML
400.0000 mL | INJECTION | Status: DC
  Filled 2017-10-27: qty 400

## 2017-10-27 MED ORDER — BUPIVACAINE HCL (PF) 0.5 % IJ SOLN
INTRAMUSCULAR | Status: AC
  Filled 2017-10-27: qty 30

## 2017-10-27 MED ORDER — LIDOCAINE HCL (PF) 2 % IJ SOLN
INTRAMUSCULAR | Status: AC
  Filled 2017-10-27: qty 10

## 2017-10-27 MED ORDER — NALBUPHINE HCL 10 MG/ML IJ SOLN: 5.0000 mg | Freq: Once | INTRAMUSCULAR | Status: DC | PRN

## 2017-10-27 MED ORDER — EPHEDRINE SULFATE 50 MG/ML IJ SOLN
INTRAMUSCULAR | Status: AC
  Filled 2017-10-27: qty 1

## 2017-10-27 MED ORDER — ACETAMINOPHEN 325 MG PO TABS
ORAL_TABLET | ORAL | Status: AC
  Administered 2017-10-27: 650 mg via ORAL
  Filled 2017-10-27: qty 2

## 2017-10-27 MED ORDER — ONDANSETRON HCL 4 MG/2ML IJ SOLN
INTRAMUSCULAR | Status: AC
  Filled 2017-10-27: qty 2

## 2017-10-27 MED ORDER — IBUPROFEN 600 MG PO TABS
600.0000 mg | ORAL_TABLET | Freq: Four times a day (QID) | ORAL | Status: DC | PRN
  Administered 2017-10-28 – 2017-10-30 (×8): 600 mg via ORAL
  Filled 2017-10-27 (×8): qty 1

## 2017-10-27 MED ORDER — PHENYLEPHRINE HCL 10 MG/ML IJ SOLN
INTRAMUSCULAR | Status: AC
  Filled 2017-10-27: qty 1

## 2017-10-27 MED ORDER — DIPHENHYDRAMINE HCL 25 MG PO CAPS: 25.0000 mg | ORAL_CAPSULE | ORAL | Status: DC | PRN

## 2017-10-27 MED ORDER — COCONUT OIL OIL
1.0000 "application " | TOPICAL_OIL | Status: DC | PRN
  Administered 2017-10-27: 1 via TOPICAL
  Filled 2017-10-27: qty 120

## 2017-10-27 MED ORDER — SOD CITRATE-CITRIC ACID 500-334 MG/5ML PO SOLN
ORAL | Status: AC
  Administered 2017-10-27: 30 mL via ORAL
  Filled 2017-10-27: qty 15

## 2017-10-27 MED ORDER — OXYCODONE-ACETAMINOPHEN 5-325 MG PO TABS
1.0000 | ORAL_TABLET | ORAL | Status: DC | PRN
  Administered 2017-10-28 – 2017-10-30 (×9): 1 via ORAL
  Filled 2017-10-27 (×8): qty 1

## 2017-10-27 MED ORDER — SIMETHICONE 80 MG PO CHEW
80.0000 mg | CHEWABLE_TABLET | ORAL | Status: DC | PRN
  Administered 2017-10-29: 80 mg via ORAL

## 2017-10-27 MED ORDER — SIMETHICONE 80 MG PO CHEW
80.0000 mg | CHEWABLE_TABLET | ORAL | Status: DC
  Administered 2017-10-27 – 2017-10-29 (×2): 80 mg via ORAL
  Filled 2017-10-27 (×2): qty 1

## 2017-10-27 MED ORDER — FENTANYL CITRATE (PF) 100 MCG/2ML IJ SOLN
INTRAMUSCULAR | Status: AC
  Filled 2017-10-27: qty 2

## 2017-10-27 MED ORDER — MORPHINE SULFATE (PF) 0.5 MG/ML IJ SOLN
INTRAMUSCULAR | Status: AC
  Filled 2017-10-27: qty 10

## 2017-10-27 MED ORDER — KETOROLAC TROMETHAMINE 30 MG/ML IJ SOLN
30.0000 mg | Freq: Four times a day (QID) | INTRAMUSCULAR | Status: AC
  Filled 2017-10-27: qty 1

## 2017-10-27 MED ORDER — SIMETHICONE 80 MG PO CHEW
80.0000 mg | CHEWABLE_TABLET | Freq: Three times a day (TID) | ORAL | Status: DC
  Administered 2017-10-27 – 2017-10-30 (×10): 80 mg via ORAL
  Filled 2017-10-27 (×11): qty 1

## 2017-10-27 MED ORDER — OXYTOCIN 40 UNITS IN LACTATED RINGERS INFUSION - SIMPLE MED
INTRAVENOUS | Status: AC
  Filled 2017-10-27: qty 1000

## 2017-10-27 MED ORDER — CEFAZOLIN SODIUM-DEXTROSE 2-4 GM/100ML-% IV SOLN
2.0000 g | INTRAVENOUS | Status: AC
  Administered 2017-10-27: 2 g via INTRAVENOUS
  Filled 2017-10-27: qty 100

## 2017-10-27 MED ORDER — MEPERIDINE HCL 25 MG/ML IJ SOLN: 6.2500 mg | INTRAMUSCULAR | Status: DC | PRN

## 2017-10-27 MED ORDER — FENTANYL CITRATE (PF) 100 MCG/2ML IJ SOLN
INTRAMUSCULAR | Status: DC | PRN
  Administered 2017-10-27: 15 ug via INTRATHECAL

## 2017-10-27 MED ORDER — OXYCODONE HCL 5 MG PO TABS: 5.0000 mg | ORAL_TABLET | ORAL | Status: DC | PRN

## 2017-10-27 MED ORDER — HYDRALAZINE HCL 20 MG/ML IJ SOLN: 5.0000 mg | INTRAMUSCULAR | Status: DC | PRN

## 2017-10-27 MED ORDER — MENTHOL 3 MG MT LOZG
1.0000 | LOZENGE | OROMUCOSAL | Status: DC | PRN
  Filled 2017-10-27: qty 9

## 2017-10-27 MED ORDER — BUPIVACAINE IN DEXTROSE 0.75-8.25 % IT SOLN
INTRATHECAL | Status: AC
  Filled 2017-10-27: qty 2

## 2017-10-27 MED ORDER — OXYCODONE-ACETAMINOPHEN 5-325 MG PO TABS
2.0000 | ORAL_TABLET | ORAL | Status: DC | PRN
  Administered 2017-10-29 – 2017-10-30 (×3): 2 via ORAL
  Filled 2017-10-27 (×4): qty 2

## 2017-10-27 MED ORDER — PRENATAL MULTIVITAMIN CH
1.0000 | ORAL_TABLET | Freq: Every day | ORAL | Status: DC
  Administered 2017-10-27 – 2017-10-30 (×4): 1 via ORAL
  Filled 2017-10-27 (×4): qty 1

## 2017-10-27 MED ORDER — EPHEDRINE SULFATE-NACL 50-0.9 MG/10ML-% IV SOSY
PREFILLED_SYRINGE | INTRAVENOUS | Status: DC | PRN
  Administered 2017-10-27 (×2): 5 mg via INTRAVENOUS

## 2017-10-27 MED ORDER — OXYTOCIN 40 UNITS IN LACTATED RINGERS INFUSION - SIMPLE MED
2.5000 [IU]/h | INTRAVENOUS | Status: DC
Start: 2017-10-27 — End: 2017-10-27

## 2017-10-27 MED ORDER — ROCURONIUM BROMIDE 50 MG/5ML IV SOLN
INTRAVENOUS | Status: AC
  Filled 2017-10-27: qty 1

## 2017-10-27 MED ORDER — MORPHINE SULFATE (PF) 0.5 MG/ML IJ SOLN
INTRAMUSCULAR | Status: DC | PRN
  Administered 2017-10-27: .1 mg via INTRATHECAL

## 2017-10-27 MED ORDER — DIPHENHYDRAMINE HCL 25 MG PO CAPS: 25.0000 mg | ORAL_CAPSULE | Freq: Four times a day (QID) | ORAL | Status: DC | PRN

## 2017-10-27 MED ORDER — OXYTOCIN 40 UNITS IN LACTATED RINGERS INFUSION - SIMPLE MED
INTRAVENOUS | Status: DC | PRN
  Administered 2017-10-27: 900 mL via INTRAVENOUS

## 2017-10-27 MED ORDER — SODIUM CHLORIDE 0.9% FLUSH
3.0000 mL | INTRAVENOUS | Status: DC | PRN
Start: 2017-10-27 — End: 2017-10-29

## 2017-10-27 MED ORDER — SODIUM CHLORIDE 0.9 % IV SOLN
INTRAVENOUS | Status: DC
  Administered 2017-10-27: 07:00:00 via INTRAVENOUS

## 2017-10-27 MED ORDER — SOD CITRATE-CITRIC ACID 500-334 MG/5ML PO SOLN
30.0000 mL | ORAL | Status: AC
  Administered 2017-10-27: 30 mL via ORAL

## 2017-10-27 MED ORDER — BUPIVACAINE HCL 0.5 % IJ SOLN: 20.0000 mL | INTRAMUSCULAR | Status: DC

## 2017-10-27 MED ORDER — ACETAMINOPHEN 325 MG PO TABS
650.0000 mg | ORAL_TABLET | Freq: Four times a day (QID) | ORAL | Status: AC
  Administered 2017-10-27 – 2017-10-28 (×4): 650 mg via ORAL
  Filled 2017-10-27 (×3): qty 2

## 2017-10-27 MED ORDER — ONDANSETRON HCL 4 MG/2ML IJ SOLN
INTRAMUSCULAR | Status: DC | PRN
  Administered 2017-10-27: 4 mg via INTRAVENOUS

## 2017-10-27 MED ORDER — SODIUM CHLORIDE 0.9 % IV SOLN
INTRAVENOUS | Status: DC
  Administered 2017-10-27 – 2017-10-28 (×2): via INTRAVENOUS

## 2017-10-27 MED ORDER — LABETALOL HCL 5 MG/ML IV SOLN
20.0000 mg | INTRAVENOUS | Status: DC | PRN
  Filled 2017-10-27: qty 8

## 2017-10-27 SURGICAL SUPPLY — 31 items
BAG COUNTER SPONGE EZ (MISCELLANEOUS) ×2 IMPLANT
CANISTER SUCT 3000ML PPV (MISCELLANEOUS) ×3 IMPLANT
CATH KIT ON-Q SILVERSOAK 5IN (CATHETERS) ×6 IMPLANT
CHLORAPREP W/TINT 26ML (MISCELLANEOUS) ×6 IMPLANT
CLOSURE WOUND 1/2 X4 (GAUZE/BANDAGES/DRESSINGS) ×1
COUNTER SPONGE BAG EZ (MISCELLANEOUS) ×1
DERMABOND ADVANCED (GAUZE/BANDAGES/DRESSINGS) ×2
DERMABOND ADVANCED .7 DNX12 (GAUZE/BANDAGES/DRESSINGS) ×1 IMPLANT
DRSG OPSITE POSTOP 4X10 (GAUZE/BANDAGES/DRESSINGS) ×3 IMPLANT
DRSG TELFA 3X8 NADH (GAUZE/BANDAGES/DRESSINGS) ×3 IMPLANT
ELECT CAUTERY BLADE 6.4 (BLADE) ×3 IMPLANT
ELECT REM PT RETURN 9FT ADLT (ELECTROSURGICAL) ×3
ELECTRODE REM PT RTRN 9FT ADLT (ELECTROSURGICAL) ×1 IMPLANT
GAUZE SPONGE 4X4 12PLY STRL (GAUZE/BANDAGES/DRESSINGS) ×3 IMPLANT
GLOVE BIO SURGEON STRL SZ7 (GLOVE) ×3 IMPLANT
GLOVE INDICATOR 7.5 STRL GRN (GLOVE) ×3 IMPLANT
GOWN STRL REUS W/ TWL LRG LVL3 (GOWN DISPOSABLE) ×3 IMPLANT
GOWN STRL REUS W/TWL LRG LVL3 (GOWN DISPOSABLE) ×6
NS IRRIG 1000ML POUR BTL (IV SOLUTION) ×3 IMPLANT
PACK C SECTION AR (MISCELLANEOUS) ×3 IMPLANT
PAD OB MATERNITY 4.3X12.25 (PERSONAL CARE ITEMS) ×3 IMPLANT
PAD PREP 24X41 OB/GYN DISP (PERSONAL CARE ITEMS) ×3 IMPLANT
STRIP CLOSURE SKIN 1/2X4 (GAUZE/BANDAGES/DRESSINGS) ×2 IMPLANT
SUCT VACUUM KIWI BELL (SUCTIONS) ×3 IMPLANT
SUT MNCRL AB 4-0 PS2 18 (SUTURE) ×3 IMPLANT
SUT PDS AB 1 TP1 96 (SUTURE) ×3 IMPLANT
SUT PLAIN 2 0 XLH (SUTURE) ×3 IMPLANT
SUT VIC AB 0 CTX 36 (SUTURE) ×8
SUT VIC AB 0 CTX36XBRD ANBCTRL (SUTURE) ×4 IMPLANT
SUT VIC AB 1 CT1 36 (SUTURE) ×3 IMPLANT
SUT VIC AB 2-0 CT1 36 (SUTURE) IMPLANT

## 2017-10-27 NOTE — Anesthesia Post-op Follow-up Note (Signed)
Anesthesia QCDR form completed.        

## 2017-10-27 NOTE — Op Note (Signed)
Preoperative Diagnosis: 1) 32 y.o. [id number] at [redacted]w[redacted]d 2) History of two prior cesarean sections 3) Superimposed preeclampsia 4) Obesity Body mass index is 39.47 kg/m. 5) Insulin dependent gestational diabetes  6) Subglotic stenosis   Postoperative Diagnosis: 1) 32 y.o. A6T0160 at [redacted]w[redacted]d 2) History of two prior cesarean sections 3) Superimposed preeclampsia 4) Obesity Body mass index is 39.47 kg/m. 5) Insulin dependent gestational diabetes  6) Subglotic stenosis   Operation Performed: Repeat low transverse C-section via pfannenstiel skin incision  Indiciation: Repeat cesarean section in setting of superimposed preeclampsia and insulin dependent gestational diabetes  Anesthesia: Spinal  Primary Surgeon: Malachy Mood, MD   Assistant: Prentice Docker, MD  Preoperative Antibiotics: 2g Ancef  Estimated Blood Loss: 600 mL  IV Fluids: 1831mL crystaloid  Drains or Tubes: Foley to gravity drainage, ON-Q catheter system  Implants: none  Specimens Removed: none  Complications: none  Intraoperative Findings:  Normal tubes ovaries and uterus.  Delivery resulted in the birth of a liveborn female, APGAR (1 MIN): 8   APGAR (5 MINS): 9, weight 7lbs 8oz.  Moderate scarring of the inferior rectus fascia and bladder  Patient Condition:stable  Procedure in Detail:  Patient was taken to the operating room were she was administered regional anesthesia.  She was positioned in the supine position, prepped and draped in the  Usual sterile fashion.  Prior to proceeding with the case a time out was performed and the level of anesthetic was checked and noted to be adequate.  Utilizing the scalpel a pfannenstiel skin incision was made 2cm above the pubic symphysis utilizing the patient's pre-existing scar and carried down sharply to the the level of the rectus fascia.  The fascia was incised in the midline using the scalpel and then extended using mayo scissors.  The superior border of the rectus  fascia was grasped with two Kocher clamps and the underlying rectus muscles were dissected of the fascia using blunt dissection.  The median raphae was incised using Mayo scissors.   The inferior border of the rectus fascia was dissected of the rectus muscles in a similar fashion.  The midline was identified, the peritoneum was entered bluntly and expanded using manual tractions.  The uterus was noted to be in a none rotated position.  Next the bladder blade was placed retracting the bladder caudally.  A bladder flap was created.  The bladder reflection was grasped with a pickup, and Metzenbaum scissors were then used the undermine the bladder reflection.  The bladder flap was developed using digital dissection.  The bladder blade was replaced retracting the bladder caudally out of the operative field. A low transverse incision was scored on the lower uterine segment.  The hysterotomy was entered bluntly using the operators finger.  The hysterotomy incision was extended using manual traction.  The operators hand was placed within the hysterotomy position noting the fetus to be within the OA position.  The vertex was grasped, flexed, brought to the incision, and delivered a traumatically using fundal pressure and a flat Kiwi vacuum .  The remainder of the body delivered with ease.  The infant was suctioned, cord was clamped and cut before handing off to the awaiting neonatologist.  The placenta was delivered using manual extraction.  The uterus was exteriorized, wiped clean of clots and debris using two moist laps.  The hysterotomy was closed using a two layer closure of 0 Vicryl, with the first being a running locked, the second a vertical imbricating.  The uterus was returned to  the abdomen.  The peritoneal gutters were wiped clean of clots and debris using two moist laps.  The hysterotomy incision was re-inspected noted to be hemostatic.The rectus muscles were inspected noted to be hemostatic.  The superior  border of the rectus fascia was grasped with a Kocher clamp.  The ON-Q trocars were then placed 4cm above the superior border of the incision and tunneled subfascially.  The introducers were removed and the catheters were threaded through the sleeves after which the sleeves were removed.  The fascia was closed using a looped #1 PDS in a running fashion taking 1cm by 1cm bites.  The subcutaneous tissue was irrigated using warm saline, hemostasis achieved using the bovie.  The subcutaneous dead space was was 3cm and was closed.  The subcutaneous dead space was obliterated by using a 53-T 0 Chromic in a running fashion.   The skin was closed using 4-0 Monocryl in a subcuticular fashion.  Sponge needle and instrument counts were corrects times two.  The patient tolerated the procedure well and was taken to the recovery room in stable condition.

## 2017-10-27 NOTE — Transfer of Care (Signed)
Immediate Anesthesia Transfer of Care Note  Patient: Mallory Pearson  Procedure(s) Performed: CESAREAN SECTION (N/A )  Patient Location: PACU  Anesthesia Type:Spinal  Level of Consciousness: awake, alert  and oriented  Airway & Oxygen Therapy: Patient Spontanous Breathing  Post-op Assessment: Report given to RN and Post -op Vital signs reviewed and stable  Post vital signs: Reviewed and stable  Last Vitals:  Vitals:   10/27/17 0703 10/27/17 0917  BP: (!) 152/103 114/77  Pulse: 93 99  Resp:  16  Temp:    SpO2:  100%    Last Pain:  Vitals:   10/27/17 0917  TempSrc:   PainSc: 2          Complications: No apparent anesthesia complications

## 2017-10-27 NOTE — H&P (Signed)
Date of Initial H&P: 10/26/17  History reviewed, patient examined, no change in status, stable for surgery.  

## 2017-10-27 NOTE — Anesthesia Preprocedure Evaluation (Signed)
Anesthesia Evaluation  Patient identified by MRN, date of birth, ID band Patient awake    Reviewed: Allergy & Precautions, NPO status , Patient's Chart, lab work & pertinent test results  History of Anesthesia Complications Negative for: history of anesthetic complications  Airway Mallampati: II  TM Distance: >3 FB Neck ROM: Full    Dental no notable dental hx.    Pulmonary neg sleep apnea, neg COPD,  Hx of subglottic stenosis, s/p treatment at [facility], asymptomatic at this time   breath sounds clear to auscultation- rhonchi (-) wheezing      Cardiovascular hypertension, Pt. on medications (-) CAD, (-) Past MI, (-) Cardiac Stents and (-) CABG  Rhythm:Regular Rate:Normal - Systolic murmurs and - Diastolic murmurs    Neuro/Psych PSYCHIATRIC DISORDERS Depression negative neurological ROS     GI/Hepatic Neg liver ROS, GERD  ,  Endo/Other  diabetes, Gestational, Insulin Dependent  Renal/GU negative Renal ROS     Musculoskeletal negative musculoskeletal ROS (+)   Abdominal (+) + obese, Gravid abdomen  Peds  Hematology negative hematology ROS (+)   Anesthesia Other Findings Past Medical History: No date: Allergy No date: GERD (gastroesophageal reflux disease) No date: Gestational diabetes No date: History of chicken pox No date: Hypertension No date: Subglottic stenosis No date: Tachycardia   Reproductive/Obstetrics (+) Pregnancy                             Lab Results  Component Value Date   WBC 11.7 (H) 10/26/2017   HGB 13.0 10/26/2017   HCT 38.8 10/26/2017   MCV 82.1 10/26/2017   PLT 188 10/26/2017    Anesthesia Physical Anesthesia Plan  ASA: III  Anesthesia Plan: Spinal   Post-op Pain Management:    Induction:   PONV Risk Score and Plan: 2 and Ondansetron  Airway Management Planned: Natural Airway  Additional Equipment:   Intra-op Plan:   Post-operative Plan:    Informed Consent: I have reviewed the patients History and Physical, chart, labs and discussed the procedure including the risks, benefits and alternatives for the proposed anesthesia with the patient or authorized representative who has indicated his/her understanding and acceptance.   Dental advisory given  Plan Discussed with: CRNA and Anesthesiologist  Anesthesia Plan Comments:         Anesthesia Quick Evaluation

## 2017-10-27 NOTE — Anesthesia Procedure Notes (Signed)
Spinal  Patient location during procedure: OR Start time: 10/27/2017 7:42 AM End time: 10/27/2017 7:47 AM Staffing Anesthesiologist: Emmie Niemann, MD Resident/CRNA: Hedda Slade, CRNA Performed: resident/CRNA  Preanesthetic Checklist Completed: patient identified, site marked, surgical consent, pre-op evaluation, timeout performed, IV checked, risks and benefits discussed and monitors and equipment checked Spinal Block Patient position: sitting Prep: ChloraPrep Patient monitoring: heart rate, continuous pulse ox, blood pressure and cardiac monitor Approach: midline Location: L4-5 Injection technique: single-shot Needle Needle type: Introducer and Pencil-Tip  Needle gauge: 24 G Needle length: 9 cm Additional Notes Negative paresthesia. Negative blood return. Positive free-flowing CSF. Expiration date of kit checked and confirmed. Patient tolerated procedure well, without complications.

## 2017-10-28 LAB — GLUCOSE, CAPILLARY
GLUCOSE-CAPILLARY: 108 mg/dL — AB (ref 65–99)
GLUCOSE-CAPILLARY: 85 mg/dL (ref 65–99)
Glucose-Capillary: 67 mg/dL (ref 65–99)

## 2017-10-28 LAB — CBC
HEMATOCRIT: 31.8 % — AB (ref 35.0–47.0)
HEMOGLOBIN: 10.7 g/dL — AB (ref 12.0–16.0)
MCH: 27.8 pg (ref 26.0–34.0)
MCHC: 33.6 g/dL (ref 32.0–36.0)
MCV: 82.8 fL (ref 80.0–100.0)
Platelets: 144 10*3/uL — ABNORMAL LOW (ref 150–440)
RBC: 3.85 MIL/uL (ref 3.80–5.20)
RDW: 15.3 % — ABNORMAL HIGH (ref 11.5–14.5)
WBC: 7.6 10*3/uL (ref 3.6–11.0)

## 2017-10-28 NOTE — Anesthesia Postprocedure Evaluation (Signed)
Anesthesia Post Note  Patient: Mallory Pearson  Procedure(s) Performed: CESAREAN SECTION (N/A )  Patient location during evaluation: [facility] Anesthesia Type: Spinal Level of consciousness: oriented and awake and alert Pain management: pain level controlled Vital Signs Assessment: post-procedure vital signs reviewed and stable Respiratory status: spontaneous breathing, respiratory function stable and patient connected to nasal cannula oxygen Cardiovascular status: blood pressure returned to baseline and stable Postop Assessment: no headache, no backache and no apparent nausea or vomiting Anesthetic complications: no     Last Vitals:  Vitals:   10/28/17 0344 10/28/17 0436  BP: (!) 152/100 139/88  Pulse: (!) 106 (!) 105  Resp: 20   Temp: 36.8 C   SpO2: 99%     Last Pain:  Vitals:   10/28/17 0344  TempSrc: Oral  PainSc:                  Alison Stalling

## 2017-10-28 NOTE — Progress Notes (Signed)
POD #1 s/p repeat Cesarean section. GDM on insulin, CHTN with superimposed preeclampsia, obesity Subjective:  Doing well. Tolerating regular diet. Voided after foley removed this AM. Bleeding slowing. Pain controlled on analgesics and with ON Q. Breast feeding and pumping.   Objective:  Blood pressure (!) 142/92, pulse (!) 103, temperature 98.1 F (36.7 C), temperature source Oral, resp. rate 18, height 5\' 7"  (1.702 m), weight 114.3 kg (252 lb), last menstrual period 02/06/2017, SpO2 98 %, unknown if currently breastfeeding.  Intake: 3536ml U/O 2159ml  General: Obese WF in NAD Pulmonary: no increased work of breathing/ CTA Heart: mild tachycardia with regular rhythm, no murmurs Abdomen: non-distended, non-tender, BS active, fundus firm at level of umbilicus-1 FB/ML/NT Incision: Honey comb dressing C+D+I, ON Q intact Extremities: no edema, no erythema, no tenderness  Results for orders placed or performed during the hospital encounter of 10/27/17 (from the past 72 hour(s))  ABO/Rh     Status: None   Collection Time: 10/27/17  6:00 AM  Result Value Ref Range   ABO/RH(D)      A POS Performed at [facility], [address] [place], [place] [address]   Glucose, capillary     Status: None   Collection Time: 10/27/17  6:39 AM  Result Value Ref Range   Glucose-Capillary 91 65 - 99 mg/dL  Glucose, capillary     Status: None   Collection Time: 10/27/17  1:51 PM  Result Value Ref Range   Glucose-Capillary 67 65 - 99 mg/dL  Glucose, capillary     Status: Abnormal   Collection Time: 10/27/17  7:34 PM  Result Value Ref Range   Glucose-Capillary 107 (H) 65 - 99 mg/dL  Glucose, capillary     Status: Abnormal   Collection Time: 10/27/17 11:57 PM  Result Value Ref Range   Glucose-Capillary 124 (H) 65 - 99 mg/dL  CBC     Status: Abnormal   Collection Time: 10/28/17  4:23 AM  Result Value Ref Range   WBC 7.6 3.6 - 11.0 K/uL   RBC 3.85 3.80 - 5.20 MIL/uL   Hemoglobin 10.7 (L)  12.0 - 16.0 g/dL   HCT 31.8 (L) 35.0 - 47.0 %   MCV 82.8 80.0 - 100.0 fL   MCH 27.8 26.0 - 34.0 pg   MCHC 33.6 32.0 - 36.0 g/dL   RDW 15.3 (H) 11.5 - 14.5 %   Platelets 144 (L) 150 - 440 K/uL    Comment: Performed at [facility], [place], [place] [address]  Glucose, capillary     Status: None   Collection Time: 10/28/17  9:01 AM  Result Value Ref Range   Glucose-Capillary 67 65 - 99 mg/dL     Assessment:   32 y.o. [id number] postoperativeday # 1-stable  Acceptable blood sugars-continue to monitor qid  Normal to low range blood pressures-continue q 4 hour blood pressures, consider antihypertensive   medication if they remain elevated  DVT prevention: ambulate  Continue regular diet     Plan:  1)Mild acute blood loss anemia - hemodynamically stable and asymptomatic - po vitamins with iron  2)A POS/ RI/ VI  3) TDAP UTD  4) Breast/ Contraception?  5) Disposition-home on POD 2 or 3 if no complications  Dalia Heading, CNM

## 2017-10-28 NOTE — Discharge Summary (Signed)
Obstetric Discharge Summary Reason for Admission: cesarean section Prenatal Procedures: none Intrapartum Procedures: cesarean: low cervical, transverse Postpartum Procedures: none Complications-Operative and Postpartum: none Hemoglobin  Date Value Ref Range Status  10/28/2017 10.7 (L) 12.0 - 16.0 g/dL Final  08/25/2017 11.8 11.1 - 15.9 g/dL Final   HCT  Date Value Ref Range Status  10/28/2017 31.8 (L) 35.0 - 47.0 % Final   Hematocrit  Date Value Ref Range Status  08/25/2017 35.9 34.0 - 46.6 % Final    Physical Exam:  General: alert, cooperative and no distress Lochia: appropriate Uterine Fundus: firm Incision: no significant erythema, induration, warmth, and tenderness. It is clean, dry and intact with the bandage in place DVT Evaluation: No cords or calf tenderness. No significant calf/ankle edema.  Discharge Diagnoses: Term Pregnancy-delivered  Discharge Information: Date: 10/30/2017 Activity: pelvic rest and NO HEAVY LIFTING >10LBS Diet: routine Allergies as of 10/30/2017      Reactions   Sulfonamide Derivatives Hives      Medication List    STOP taking these medications   insulin glargine 100 UNIT/ML injection Commonly known as:  LANTUS   insulin lispro 100 UNIT/ML injection Commonly known as:  HUMALOG   INSULIN SYRINGE .5CC/29G 29G X 1/2" 0.5 ML Misc   ondansetron 4 MG disintegrating tablet Commonly known as:  ZOFRAN ODT     TAKE these medications   ibuprofen 600 MG tablet Commonly known as:  ADVIL,MOTRIN Take 1 tablet (600 mg total) by mouth every 6 (six) hours as needed for mild pain or moderate pain.   labetalol 200 MG tablet Commonly known as:  NORMODYNE Take 1 tablet (200 mg total) by mouth 2 (two) times daily. What changed:  medication strength   multivitamin-prenatal 27-0.8 MG Tabs tablet Take 1 tablet by mouth daily.   omeprazole 40 MG capsule Commonly known as:  PRILOSEC Take 1 capsule (40 mg total) daily by mouth.   oxyCODONE 5 MG  immediate release tablet Commonly known as:  Oxy IR/ROXICODONE Take 1 tablet (5 mg total) by mouth every 4 (four) hours as needed (breakthrough pain).            Discharge Care Instructions  (From admission, onward)        Start     Ordered   10/30/17 0000  Discharge wound care:    Comments:  Perform wound care instructions   10/30/17 1026      Condition: stable Discharge to: home Follow-up Information    Malachy Mood, MD Follow up in 2 week(s).   Specialty:  Obstetrics and Gynecology Why:  For BP check (2/26) and on 3/1 for incision check Contact information: [place] [address] [phone]           Newborn Data: Live born female  Birth Weight: 7 lb 7.6 oz (3390 g) APGAR: 5, 9  Newborn Delivery   Birth date/time:  10/27/2017 08:20:00 Delivery type:  C-Section, Low Transverse C-section categorization:  Repeat     Home with mother.  Prentice Docker, MD 10/30/2017, 10:28 AM

## 2017-10-28 NOTE — Anesthesia Post-op Follow-up Note (Signed)
  Anesthesia Pain Follow-up Note  Patient: Mallory Pearson  Day #: 1  Date of Follow-up: 10/28/2017 Time: 7:43 AM  Last Vitals:  Vitals:   10/28/17 0344 10/28/17 0436  BP: (!) 152/100 139/88  Pulse: (!) 106 (!) 105  Resp: 20   Temp: 36.8 C   SpO2: 99%     Level of Consciousness: alert  Pain: none   Side Effects:None  Catheter Site Exam:clean, dry, no drainage     Plan: D/C from anesthesia care at surgeon's request  Alison Stalling

## 2017-10-29 LAB — GLUCOSE, CAPILLARY
GLUCOSE-CAPILLARY: 79 mg/dL (ref 65–99)
GLUCOSE-CAPILLARY: 124 mg/dL — AB (ref 65–99)
Glucose-Capillary: 101 mg/dL — ABNORMAL HIGH (ref 65–99)

## 2017-10-29 MED ORDER — LABETALOL HCL 200 MG PO TABS
200.0000 mg | ORAL_TABLET | Freq: Two times a day (BID) | ORAL | Status: DC
  Administered 2017-10-29 – 2017-10-30 (×3): 200 mg via ORAL
  Filled 2017-10-29 (×3): qty 1

## 2017-10-29 NOTE — Progress Notes (Signed)
Pt's bedtime POCT 133 at this time

## 2017-10-29 NOTE — Progress Notes (Signed)
Obstetric Postpartum/PostOperative Daily Progress Note Subjective:  32 y.o. [id number] POD#2 status post repeat cesarean delivery.  She is ambulating, is tolerating po, is voiding spontaneously.  Her pain is well controlled on PO pain medications. Her lochia is less than menses.  Denies HA, visual changes, RUQ pain.   Medications SCHEDULED MEDICATIONS  . labetalol  200 mg Oral BID  . prenatal multivitamin  1 tablet Oral Q1200  . senna-docusate  2 tablet Oral Q24H  . simethicone  80 mg Oral TID PC  . simethicone  80 mg Oral Q24H    MEDICATION INFUSIONS  . sodium chloride Stopped (10/28/17 1000)  . bupivacaine 0.25 % ON-Q pump DUAL CATH 400 mL    . bupivacaine ON-Q pain pump      PRN MEDICATIONS  coconut oil, witch hazel-glycerin **AND** dibucaine, diphenhydrAMINE **OR** diphenhydrAMINE, diphenhydrAMINE, ibuprofen, menthol-cetylpyridinium, nalbuphine **OR** nalbuphine, naloxone **AND** sodium chloride flush, ondansetron (ZOFRAN) IV, oxyCODONE **OR** oxyCODONE, oxyCODONE-acetaminophen, oxyCODONE-acetaminophen, simethicone    Objective:   Vitals:   10/28/17 2356 10/29/17 0501 10/29/17 0838 10/29/17 0912  BP: (!) 149/81 (!) 149/95 (!) 154/97 (!) 152/91  Pulse: (!) 118 (!) 109 (!) 108   Resp: 18 18 20    Temp: 98.3 F (36.8 C) 98.2 F (36.8 C) 98.2 F (36.8 C)   TempSrc: Oral Oral Oral   SpO2: 97% 94% 98%   Weight:      Height:        Current Vital Signs 24h Vital Sign Ranges  T 98.2 F (36.8 C) Temp  Avg: 98.2 F (36.8 C)  Min: 98.1 F (36.7 C)  Max: 98.4 F (36.9 C)  BP (!) 152/91(notify Abby, RN ) BP  Min: 139/97  Max: 154/97  HR (!) 108 Pulse  Avg: 111.3  Min: 107  Max: 118  RR 20 Resp  Avg: 18.3  Min: 18  Max: 20  SaO2 98 % Not Delivered SpO2  Avg: 97.3 %  Min: 94 %  Max: 99 %       24 Hour I/O Current Shift I/O  Time Ins Outs 02/22 0701 - 02/23 0700 In: 8299 [P.O.:480; I.V.:2961] Out: 7050 [Urine:7050] No intake/output data recorded.  General: NAD Pulmonary:  CTAB CV: RRR Abdomen: non-distended, non-tender, fundus firm at level of umbilicus Inc: Clean/dry/intact Extremities: no edema, no erythema, no tenderness  Labs:  Recent Labs  Lab 10/26/17 1027 10/28/17 0423  WBC 11.7* 7.6  HGB 13.0 10.7*  HCT 38.8 31.8*  PLT 188 144*    Assessment:   32 y.o. [id number] postoperative day # 2, s/p repeat cesarean section, complicated by preeclampsia, GDM requiring insulin  Plan:  1) Acute blood loss anemia - hemodynamically stable and asymptomatic - po ferrous sulfate  2) A POS Performed at [facility], [address] [place], [place] [address]  / Charlynn Grimes 11.50 (08/06 0000)/ Varicella Immune  3) TDAP status: received AP, flu vaccine: received AP  4) breast feeding/Contraception = oral progesterone-only contraceptive   5) Chronic hypertension with superimposed preeclampsia:  BPs in the 150s SBP. Will start labetalol 200 mg po BID today and monitor.  6) GDM: no insulin requirement so far.  Needs 6 week 2h gtt  7) Disposition: home tomorrow, If remains stable  Will Bonnet, MD, [facility] 10/29/2017 10:02 AM

## 2017-10-30 LAB — GLUCOSE, CAPILLARY
GLUCOSE-CAPILLARY: 79 mg/dL (ref 65–99)
GLUCOSE-CAPILLARY: 86 mg/dL (ref 65–99)

## 2017-10-30 MED ORDER — LABETALOL HCL 200 MG PO TABS: 200.0000 mg | ORAL_TABLET | Freq: Two times a day (BID) | ORAL | 1 refills | Status: DC

## 2017-10-30 MED ORDER — OXYCODONE HCL 5 MG PO TABS: 5.0000 mg | ORAL_TABLET | ORAL | 0 refills | Status: DC | PRN

## 2017-10-30 MED ORDER — IBUPROFEN 600 MG PO TABS: 600.0000 mg | ORAL_TABLET | Freq: Four times a day (QID) | ORAL | 0 refills | Status: DC | PRN

## 2017-10-30 NOTE — Discharge Instructions (Signed)
Please call your doctor or return to the ER if you experience any chest pains, shortness of breath, dizziness, visual changes, fever greater than 101, any heavy bleeding (saturating more than 1 pad per hour), large clots, or foul smelling discharge, any worsening abdominal pain and cramping that is not controlled by pain medication, or any signs of postpartum depression. No tampons, enemas, douches, or sexual intercourse for 6 weeks. Also avoid tub baths, hot tubs, or swimming for 6 weeks.    Check your incision daily for any signs on infection such as redness, warmth, swelling, increased pain, or pus/foul smelling drainage   Activity: do not lift over 10 lbs for 6 weeks  No driving for 2 weeks  Pelvic rest for 6 weeks

## 2017-10-30 NOTE — Progress Notes (Signed)
Discharge order received from doctor. Reviewed discharge instructions and prescriptions with patient and answered all questions. On-Q pump removal instructions given. Incision cleaning kit given. Follow up appointment instructions given. Patient verbalized understanding. ID bands checked. Patient discharged home with infant via wheelchair by nursing/auxillary.    Hilbert Bible, RN

## 2017-10-31 LAB — GLUCOSE, CAPILLARY: Glucose-Capillary: 133 mg/dL — ABNORMAL HIGH (ref 65–99)

## 2017-11-01 ENCOUNTER — Ambulatory Visit (INDEPENDENT_AMBULATORY_CARE_PROVIDER_SITE_OTHER): Payer: 59 | Admitting: Obstetrics & Gynecology

## 2017-11-01 ENCOUNTER — Encounter: Payer: Self-pay | Admitting: Obstetrics & Gynecology

## 2017-11-01 ENCOUNTER — Telehealth: Payer: Self-pay

## 2017-11-01 VITALS — BP 150/108 | HR 114 | Ht 67.0 in | Wt 243.0 lb

## 2017-11-01 DIAGNOSIS — O119 Pre-existing hypertension with pre-eclampsia, unspecified trimester: Secondary | ICD-10-CM

## 2017-11-01 NOTE — Progress Notes (Signed)
  History of Present Illness:  Mallory Pearson is a 32 y.o. who was started on  LABETALOL approximately 3 days ago, postpartum after CS. Since that time, she states that her symptoms are improving.  Denies ha, blurry vision, CP, SOB, epig pain, edema.  PMHx: She  has a past medical history of Allergy, GERD (gastroesophageal reflux disease), Gestational diabetes, History of chicken pox, Hypertension, Subglottic stenosis, and Tachycardia. Also,  has a past surgical history that includes Knee surgery (Left, 2005); Cesarean section (11/26/11); Cesarean section (N/A, 04/01/2016); Tonsillectomy; Larynx surgery (2018); and Cesarean section (N/A, 10/27/2017)., family history is not on file.,  reports that  has never smoked. she has never used smokeless tobacco. She reports that she does not drink alcohol or use drugs.  Current Outpatient Medications:  .  ibuprofen (ADVIL,MOTRIN) 600 MG tablet, Take 1 tablet (600 mg total) by mouth every 6 (six) hours as needed for mild pain or moderate pain., Disp: 30 tablet, Rfl: 0 .  labetalol (NORMODYNE) 200 MG tablet, Take 1 tablet (200 mg total) by mouth 2 (two) times daily., Disp: 60 tablet, Rfl: 1 .  omeprazole (PRILOSEC) 40 MG capsule, Take 1 capsule (40 mg total) daily by mouth., Disp: 30 capsule, Rfl: 11 .  oxyCODONE (OXY IR/ROXICODONE) 5 MG immediate release tablet, Take 1 tablet (5 mg total) by mouth every 4 (four) hours as needed (breakthrough pain)., Disp: 30 tablet, Rfl: 0 .  Prenatal Vit-Fe Fumarate-FA (MULTIVITAMIN-PRENATAL) 27-0.8 MG TABS, Take 1 tablet by mouth daily. , Disp: , Rfl:   Also, is allergic to sulfonamide derivatives..  Review of Systems  Constitutional: Negative for chills, fever and malaise/fatigue.  HENT: Negative for congestion, sinus pain and sore throat.   Eyes: Negative for blurred vision and pain.  Respiratory: Negative for cough and wheezing.   Cardiovascular: Negative for chest pain and leg swelling.  Gastrointestinal: Negative for  abdominal pain, constipation, diarrhea, heartburn, nausea and vomiting.  Genitourinary: Negative for dysuria, frequency, hematuria and urgency.  Musculoskeletal: Negative for back pain, joint pain, myalgias and neck pain.  Skin: Negative for itching and rash.  Neurological: Negative for dizziness, tremors and weakness.  Endo/Heme/Allergies: Does not bruise/bleed easily.  Psychiatric/Behavioral: Negative for depression. The patient is not nervous/anxious and does not have insomnia.   Inc healing well  Physical Exam:  BP (!) 150/108   Pulse (!) 114   Ht 5\' 7"  (1.702 m)   Wt 243 lb (110.2 kg)   BMI 38.06 kg/m  Body mass index is 38.06 kg/m. Constitutional: Well nourished, well developed female in no acute distress.  Abdomen: diffusely non tender to palpation, non distended, and no masses, hernias Neuro: Grossly intact Psych:  Normal mood and affect.    Assessment:  Problem List Items Addressed This Visit      Cardiovascular and Mediastinum   Chronic hypertension with superimposed preeclampsia - Primary    Medication treatment is going well for her BP except not complete control.  Plan: She will undergo increase to 400 mg BID in her medical therapy.  She was amenable to this plan and we will see her back in 3 days.  Mallory Applebaum, MD, [facility], [facility] 11/01/2017  4:04 PM

## 2017-11-01 NOTE — Telephone Encounter (Signed)
FMLA/DISABILITY additional information for [employer] filled out, signature obtained, and give to [place] for processing.

## 2017-11-02 ENCOUNTER — Other Ambulatory Visit: Payer: Self-pay

## 2017-11-02 ENCOUNTER — Observation Stay
Admission: EM | Admit: 2017-11-02 | Discharge: 2017-11-04 | Disposition: A | Discharge status: Home / Self Care | Payer: 59 | Attending: Obstetrics and Gynecology | Admitting: Obstetrics and Gynecology

## 2017-11-02 DIAGNOSIS — K219 Gastro-esophageal reflux disease without esophagitis: Secondary | ICD-10-CM | POA: Insufficient documentation

## 2017-11-02 DIAGNOSIS — Z8632 Personal history of gestational diabetes: Secondary | ICD-10-CM | POA: Diagnosis not present

## 2017-11-02 DIAGNOSIS — O1495 Unspecified pre-eclampsia, complicating the puerperium: Secondary | ICD-10-CM | POA: Diagnosis present

## 2017-11-02 DIAGNOSIS — O1415 Severe pre-eclampsia, complicating the puerperium: Principal | ICD-10-CM | POA: Insufficient documentation

## 2017-11-02 DIAGNOSIS — O119 Pre-existing hypertension with pre-eclampsia, unspecified trimester: Secondary | ICD-10-CM | POA: Diagnosis present

## 2017-11-02 DIAGNOSIS — Z79899 Other long term (current) drug therapy: Secondary | ICD-10-CM | POA: Insufficient documentation

## 2017-11-02 DIAGNOSIS — Z882 Allergy status to sulfonamides status: Secondary | ICD-10-CM | POA: Diagnosis not present

## 2017-11-02 LAB — CBC
HCT: 37.5 % (ref 35.0–47.0)
HEMOGLOBIN: 12.6 g/dL (ref 12.0–16.0)
MCH: 27.8 pg (ref 26.0–34.0)
MCHC: 33.6 g/dL (ref 32.0–36.0)
MCV: 82.6 fL (ref 80.0–100.0)
PLATELETS: 280 10*3/uL (ref 150–440)
RBC: 4.54 MIL/uL (ref 3.80–5.20)
RDW: 15.1 % — ABNORMAL HIGH (ref 11.5–14.5)
WBC: 8 10*3/uL (ref 3.6–11.0)

## 2017-11-02 LAB — COMPREHENSIVE METABOLIC PANEL
ALBUMIN: 3.3 g/dL — AB (ref 3.5–5.0)
ALK PHOS: 79 U/L (ref 38–126)
ALT: 17 U/L (ref 14–54)
AST: 22 U/L (ref 15–41)
Anion gap: 11 (ref 5–15)
BUN: 12 mg/dL (ref 6–20)
CALCIUM: 8.5 mg/dL — AB (ref 8.9–10.3)
CHLORIDE: 107 mmol/L (ref 101–111)
CO2: 21 mmol/L — AB (ref 22–32)
CREATININE: 0.7 mg/dL (ref 0.44–1.00)
GFR calc Af Amer: >60 mL/min (ref >60)
GFR calc non Af Amer: >60 mL/min (ref >60)
GLUCOSE: 97 mg/dL (ref 65–99)
Potassium: 3.5 mmol/L (ref 3.5–5.1)
SODIUM: 139 mmol/L (ref 135–145)
Total Bilirubin: 0.8 mg/dL (ref 0.3–1.2)
Total Protein: 6.5 g/dL (ref 6.5–8.1)

## 2017-11-02 LAB — PROTEIN / CREATININE RATIO, URINE
Creatinine, Urine: 156 mg/dL
Protein Creatinine Ratio: 0.15 mg/mg{Cre} (ref 0.00–0.15)
Total Protein, Urine: 23 mg/dL

## 2017-11-02 MED ORDER — MAGNESIUM SULFATE BOLUS VIA INFUSION
4.0000 g | Freq: Once | INTRAVENOUS | Status: DC
  Filled 2017-11-02: qty 500

## 2017-11-02 MED ORDER — ACETAMINOPHEN 500 MG PO TABS
ORAL_TABLET | ORAL | Status: AC
  Administered 2017-11-03: 500 mg via ORAL
  Filled 2017-11-02: qty 2

## 2017-11-02 MED ORDER — BREAST MILK
ORAL | Status: DC
  Filled 2017-11-02 (×91): qty 1

## 2017-11-02 MED ORDER — LABETALOL HCL 100 MG PO TABS: 200.0000 mg | ORAL_TABLET | Freq: Two times a day (BID) | ORAL | Status: DC

## 2017-11-02 MED ORDER — MAGNESIUM SULFATE 40 G IN LACTATED RINGERS - SIMPLE
2.0000 g/h | INTRAVENOUS | Status: DC
  Administered 2017-11-02: 2 g/h via INTRAVENOUS
  Filled 2017-11-02 (×2): qty 500

## 2017-11-02 MED ORDER — PANTOPRAZOLE SODIUM 40 MG PO TBEC
40.0000 mg | DELAYED_RELEASE_TABLET | Freq: Every day | ORAL | Status: DC
  Administered 2017-11-04: 40 mg via ORAL
  Filled 2017-11-02 (×3): qty 1

## 2017-11-02 MED ORDER — LABETALOL HCL 5 MG/ML IV SOLN
20.0000 mg | INTRAVENOUS | Status: DC | PRN
  Administered 2017-11-02: 20 mg via INTRAVENOUS
  Filled 2017-11-02: qty 4
  Filled 2017-11-02: qty 16

## 2017-11-02 MED ORDER — LABETALOL HCL 200 MG PO TABS
400.0000 mg | ORAL_TABLET | Freq: Two times a day (BID) | ORAL | Status: DC
  Filled 2017-11-02: qty 2

## 2017-11-02 MED ORDER — PRENATAL MULTIVITAMIN CH
1.0000 | ORAL_TABLET | Freq: Every day | ORAL | Status: DC
  Administered 2017-11-03 – 2017-11-04 (×2): 1 via ORAL
  Filled 2017-11-02 (×2): qty 1

## 2017-11-02 MED ORDER — LACTATED RINGERS IV SOLN
INTRAVENOUS | Status: DC
  Administered 2017-11-02 – 2017-11-03 (×2): via INTRAVENOUS

## 2017-11-02 MED ORDER — MAGNESIUM SULFATE 4 GM/100ML IV SOLN
INTRAVENOUS | Status: AC
  Administered 2017-11-02: 4 g
  Filled 2017-11-02: qty 100

## 2017-11-02 MED ORDER — LABETALOL HCL 200 MG PO TABS
400.0000 mg | ORAL_TABLET | Freq: Two times a day (BID) | ORAL | Status: DC
  Administered 2017-11-02 – 2017-11-04 (×4): 400 mg via ORAL
  Filled 2017-11-02: qty 4
  Filled 2017-11-02 (×3): qty 2
  Filled 2017-11-02: qty 4

## 2017-11-02 MED ORDER — ACETAMINOPHEN 500 MG PO TABS
1000.0000 mg | ORAL_TABLET | Freq: Four times a day (QID) | ORAL | Status: DC | PRN
  Administered 2017-11-02: 1000 mg via ORAL
  Administered 2017-11-03: 500 mg via ORAL
  Administered 2017-11-03: 1000 mg via ORAL
  Filled 2017-11-02: qty 2

## 2017-11-02 MED ORDER — HYDRALAZINE HCL 20 MG/ML IJ SOLN: 10.0000 mg | Freq: Once | INTRAMUSCULAR | Status: DC | PRN

## 2017-11-02 NOTE — H&P (Signed)
GYNECOLOGY ADMISSION HISTORY AND PHYSICAL NOTE     CHRISTIONNA Pearson [id number] 11/02/2017 10:46 PM    Chief Complaint:   Mallory Pearson is a 32 y.o. [phone] female who presents with uncontrolled severe-range blood pressures at home.    History of Present Ilness:   The patient is POD#6 s/p repeat c-section for superimposed preeclampsia who has been having increasing doses of labetalol since discharge on POD#3.  She awoke this morning with a headache and SBPs 160s-170s and DBPs > 110.  She has had intermittent headaches not completely relieved by percocet and ibuprofen.  Currently she has no headache, no visual changes, no RUQ pain.  She denies difficulty breathing and chest pain.   Past Medical History:  Diagnosis Date  . Allergy   . GERD (gastroesophageal reflux disease)   . Gestational diabetes   . History of chicken pox   . Hypertension   . Subglottic stenosis   . Tachycardia    Past Surgical History:  Procedure Laterality Date  . CESAREAN SECTION  11/26/11  . CESAREAN SECTION N/A 04/01/2016   Procedure: CESAREAN SECTION;  Surgeon: Gae Dry, MD;  Location: [facility] ORS;  Service: Obstetrics;  Laterality: N/A;  Time of birth 18:50 Sex: Female Weight: 7 lbs, 13 oz   . CESAREAN SECTION N/A 10/27/2017   Procedure: CESAREAN SECTION;  Surgeon: Malachy Mood, MD;  Location: [facility] ORS;  Service: Obstetrics;  Laterality: N/A;  . KNEE SURGERY Left 2005  . LARYNX SURGERY  2018  . TONSILLECTOMY     Allergies  Allergen Reactions  . Sulfonamide Derivatives Hives   Prior to Admission medications   Medication Sig Start Date End Date Taking? Authorizing Provider  ibuprofen (ADVIL,MOTRIN) 600 MG tablet Take 1 tablet (600 mg total) by mouth every 6 (six) hours as needed for mild pain or moderate pain. 10/30/17   Will Bonnet, MD  labetalol (NORMODYNE) 200 MG tablet Take 1 tablet (200 mg total) by mouth 2 (two) times daily. 10/30/17   Will Bonnet, MD  omeprazole (PRILOSEC) 40  MG capsule Take 1 capsule (40 mg total) daily by mouth. 07/26/17 07/26/18  Malachy Mood, MD  oxyCODONE (OXY IR/ROXICODONE) 5 MG immediate release tablet Take 1 tablet (5 mg total) by mouth every 4 (four) hours as needed (breakthrough pain). 10/30/17   Will Bonnet, MD  Prenatal Vit-Fe Fumarate-FA (MULTIVITAMIN-PRENATAL) 27-0.8 MG TABS Take 1 tablet by mouth daily.     [provider]    Obstetric History: She is a [id number] female s/p c-section x 3.    Social History:  She  reports that  has never smoked. she has never used smokeless tobacco. She reports that she does not drink alcohol or use drugs.  Family History:  Denies gynecologic cancers in her family medical history.  Review of Systems:   Review of Systems  Constitutional: Negative.   HENT: Negative.   Eyes: Negative.  Negative for blurred vision and double vision.  Respiratory: Negative.   Cardiovascular: Positive for palpitations. Negative for chest pain, orthopnea, claudication, leg swelling and PND.  Gastrointestinal: Negative.  Negative for abdominal pain, blood in stool, constipation, diarrhea, heartburn, melena, nausea and vomiting.  Genitourinary: Negative.   Musculoskeletal: Negative.   Skin: Negative.   Neurological: Positive for headaches. Negative for dizziness, tingling, tremors, sensory change, speech change, focal weakness, seizures and loss of consciousness.  Psychiatric/Behavioral: Negative.      Objective    BP (!) 145/93   Pulse 96  Temp 98 F (36.7 C) (Oral)   Resp 18   Ht 5\' 7"  (1.702 m)   Wt 243 lb (110.2 kg)   BMI 38.06 kg/m   BPs 140-160s/70s-100s Physical Exam  Physical Exam  Constitutional: She is oriented to person, place, and time. She appears well-developed and well-nourished. No distress.  HENT:  Head: Normocephalic and atraumatic.  Eyes: Conjunctivae are normal. No scleral icterus.  Neck: Normal range of motion. Neck supple.  Cardiovascular: Normal rate and regular  rhythm. Exam reveals no gallop and no friction rub.  No murmur heard. Pulmonary/Chest: Effort normal and breath sounds normal. No stridor. No respiratory distress. She has no wheezes. She has no rales.  Abdominal: Soft. Bowel sounds are normal. She exhibits mass (uterine funuds at U-4). She exhibits no distension. There is no tenderness. There is no guarding.  Ecchymosis about 4cm above incision line at OnQ site- non-tender.   Incision without erythema, induration, warmth, and tenderness.  It is clean, dry, and intact.  Musculoskeletal: Normal range of motion. She exhibits no edema.  Neurological: She is alert and oriented to person, place, and time. She displays normal reflexes (2+ brachioradialis). No cranial nerve deficit. She exhibits normal muscle tone. Coordination normal.  Skin: Skin is warm and dry. No erythema.  Psychiatric: She has a normal mood and affect. Her behavior is normal. Judgment normal.    Laboratory Results:   Lab Results  Component Value Date   WBC 8.0 11/02/2017   HGB 12.6 11/02/2017   HCT 37.5 11/02/2017   PLT 280 11/02/2017   CREATININE 0.70 11/02/2017   ALT 17 11/02/2017   AST 22 11/02/2017   PROTCRRATIO 0.15 11/02/2017    Assessment & Plan   Mallory Pearson is a 32 y.o. [id number] female with superimposed preeclampsia now with severe range blood pressures requiring IV antihypertensive treatment.   Plan:  1.  Magnesium sulfate for seizure prophylaxis x 24 hours. Discussed risks/beneftis of this treatment versus observation and adjustment of blood pressure medications. However, given her headaches and difficult to control BPs, I believe magnesium is appropriate in this circumstance.   2.  Continue labetalol 400 mg po bid and adjust, as needed.  IV anithypertensives, prn. 3. Diet: clear liquid. If doing well, may have regular diet. 4. Continue to support pumping effort for breast milk. 5. SCDs.  May add subQ heparin, if going to be bed-bound for longer than 24  hours.  6. Dispo: pending response to magnesium and control of BPs.  Prentice Docker, MD 11/02/2017 10:46 PM

## 2017-11-03 DIAGNOSIS — Z79899 Other long term (current) drug therapy: Secondary | ICD-10-CM | POA: Diagnosis not present

## 2017-11-03 DIAGNOSIS — Z8632 Personal history of gestational diabetes: Secondary | ICD-10-CM | POA: Diagnosis not present

## 2017-11-03 DIAGNOSIS — O1415 Severe pre-eclampsia, complicating the puerperium: Secondary | ICD-10-CM | POA: Diagnosis not present

## 2017-11-03 DIAGNOSIS — Z882 Allergy status to sulfonamides status: Secondary | ICD-10-CM | POA: Diagnosis not present

## 2017-11-03 DIAGNOSIS — K219 Gastro-esophageal reflux disease without esophagitis: Secondary | ICD-10-CM | POA: Diagnosis not present

## 2017-11-03 NOTE — Progress Notes (Addendum)
Patient ID: Mallory Pearson, female   DOB: 21-Nov-1985, 32 y.o.   MRN: [id number]  Daily Obstetrics Progress Note EMIKA TIANO  [id number]  HD#2 Subjective:  Overnight Events: no acute events Complaints: None She denies: fevers, chills, chest pain, trouble breathing, nausea, vomiting, severe abdominal pain. Denies vision changes. She still has a 1/10 headache (frontal), denies RUQ pain. She has tolerated: normal diet as tolerated She reports her pain is well controlled with no medication.   She is ambulating and is voiding.  Lochia is absent at this point. She continues to pump for her newborn. Objective:  Most recent vitals Temp: 97.7 F (36.5 C)  BP: 135/90  Pulse Rate: 91  Resp: 16  SpO2: 98 %   Vitals Range over 24 hours Temp  Avg: 97.8 F (36.6 C)  Min: 97.6 F (36.4 C)  Max: 98 F (36.7 C) BP  Min: 119/82  Max: 158/100 Pulse  Avg: 89.7  Min: 82  Max: 97 SpO2  Avg: 95.9 %  Min: 93 %  Max: 98 %   Urine Output: 1600 cc over 12 hours.  600 mL between 0500 and 0700  Physical Exam Physical Exam  Constitutional: She is oriented to person, place, and time. She appears well-developed and well-nourished. No distress.  HENT:  Head: Normocephalic and atraumatic.  Eyes: Conjunctivae are normal. No scleral icterus.  Cardiovascular: Normal rate. Exam reveals no gallop and no friction rub.  No murmur heard. Tachycardic to low 100s  Pulmonary/Chest: Effort normal and breath sounds normal.  Abdominal: Soft. Bowel sounds are normal. She exhibits no distension. There is no tenderness. There is no rebound and no guarding.  c-section incision without erythema, induration, warmth, and tenderness. It is clean, dry and intact. Ecchymosis at OnQ entry sites -stable  Musculoskeletal: Normal range of motion. She exhibits no edema.  SCDs in place  Neurological: She is alert and oriented to person, place, and time.  Brachioradialis 3+   Skin: Skin is warm and dry.  Psychiatric: She has a normal  mood and affect. Her behavior is normal. Judgment normal.     AM Labs Lab Results  Component Value Date   WBC 8.0 11/02/2017   HGB 12.6 11/02/2017   HCT 37.5 11/02/2017   PLT 280 11/02/2017   CREATININE 0.70 11/02/2017   ALT 17 11/02/2017   AST 22 11/02/2017   PROTCRRATIO 0.15 11/02/2017    Assessment:  Mallory Pearson is a 32 y.o. female HD#2 admitted for postpartum superimposed preeclampsia with severe features (BPs uncontrolled and headache). No evidence of magnesium toxicity.  Plan:  Continue magnesium sulfate x 24 hours. Continue labetalol 400 mg PO BID PNV SCDs, will ambulate tonight and is ambulating to bedside commode Regular diet Bedside commode ambulation privileges only Stop mag sulfate this evening.   Dispo: likely tomorrow, if BPs under control with no magnesium.   Prentice Docker, MD  11/03/2017 9:06 AM

## 2017-11-03 NOTE — Progress Notes (Signed)
Pt seen, discussed plan of mgt Denies ha, blurry vision, CP, SOB, worsening edema BP 130-150/80-90 Chest clear Heart reg Extr 1+ edema UOP >100 mL/hr A/P: PP Preclampsia with severe range BPs Stop Mag at 24 hours, trend BPs overnight off of Mag Cont Labetalol and f/u as outpt after d/c in am  Barnett Applebaum, MD, [facility] 11/03/2017  8:42 PM

## 2017-11-04 ENCOUNTER — Ambulatory Visit: Payer: 59 | Admitting: Obstetrics and Gynecology

## 2017-11-04 DIAGNOSIS — O1495 Unspecified pre-eclampsia, complicating the puerperium: Secondary | ICD-10-CM | POA: Diagnosis present

## 2017-11-04 DIAGNOSIS — O1415 Severe pre-eclampsia, complicating the puerperium: Secondary | ICD-10-CM | POA: Diagnosis not present

## 2017-11-04 DIAGNOSIS — Z882 Allergy status to sulfonamides status: Secondary | ICD-10-CM | POA: Diagnosis not present

## 2017-11-04 DIAGNOSIS — Z8632 Personal history of gestational diabetes: Secondary | ICD-10-CM | POA: Diagnosis not present

## 2017-11-04 DIAGNOSIS — K219 Gastro-esophageal reflux disease without esophagitis: Secondary | ICD-10-CM | POA: Diagnosis not present

## 2017-11-04 DIAGNOSIS — Z79899 Other long term (current) drug therapy: Secondary | ICD-10-CM | POA: Diagnosis not present

## 2017-11-04 MED ORDER — LABETALOL HCL 200 MG PO TABS: 400.0000 mg | ORAL_TABLET | Freq: Two times a day (BID) | ORAL | 1 refills | Status: DC

## 2017-11-04 NOTE — Progress Notes (Signed)
Discharge instructions reviewed with pt.  Pt verbalizes u/o of taking po meds at home.  DC to home to car via auxillary

## 2017-11-04 NOTE — Discharge Summary (Signed)
Discharge Summary     Patient Name: Mallory Pearson DOB: 08-16-1986 MRN: [id number]  Date of admission: 11/02/2017 Date of discharge: 11/04/2017  Admitting diagnosis: Postpartum pre-eclampsia with severe features         Discharge diagnosis: Hypertension              Hospital course:  Admitted for postpartum superimposed pre-eclampsia with severe features of headache and uncontrolled blood pressures in the severe range on 2/27 and treated with magnesium sulfate. Labetalol PO 400 mg continued throughout hospitalization. Blood pressures remained stable following discontinuation of magnesium sulfate therapy with none in the severe range  On day of discharge, she denies headache, visual changes, epigastric pain, shortness of breath, and edema. She is voiding and ambulating without difficulty and tolerating a regular diet, and denies pain.                                                    Physical exam on 11/04/2017:  Vitals:   11/04/17 0745 11/04/17 0944  BP: (!) 144/99 133/82  Pulse: 94 94  Resp: 18   Temp: 98.4 F (36.9 C)   SpO2: 98%     General: alert, cooperative and no distress Pulmonary: no increased work of breathing or shortness of breath DVT Evaluation: No evidence of DVT seen on physical exam. No significant calf/ankle edema. Neuro: A&Ox3 Psych: appropriate, affect full  Labs: Lab Results  Component Value Date   WBC 8.0 11/02/2017   HGB 12.6 11/02/2017   HCT 37.5 11/02/2017   MCV 82.6 11/02/2017   PLT 280 11/02/2017   CMP Latest Ref Rng & Units 11/02/2017  Glucose 65 - 99 mg/dL 97  BUN 6 - 20 mg/dL 12  Creatinine 0.44 - 1.00 mg/dL 0.70  Sodium 135 - 145 mmol/L 139  Potassium 3.5 - 5.1 mmol/L 3.5  Chloride 101 - 111 mmol/L 107  CO2 22 - 32 mmol/L 21(L)  Calcium 8.9 - 10.3 mg/dL 8.5(L)  Total Protein 6.5 - 8.1 g/dL 6.5  Total Bilirubin 0.3 - 1.2 mg/dL 0.8  Alkaline Phos 38 - 126 U/L 79  AST 15 - 41 U/L 22  ALT 14 - 54 U/L 17    Discharge instruction: per  After Visit Summary.  Medications:  Allergies as of 11/04/2017      Reactions   Sulfonamide Derivatives Hives      Medication List    TAKE these medications   ibuprofen 600 MG tablet Commonly known as:  ADVIL,MOTRIN Take 1 tablet (600 mg total) by mouth every 6 (six) hours as needed for mild pain or moderate pain.   labetalol 200 MG tablet Commonly known as:  NORMODYNE Take 2 tablets (400 mg total) by mouth 2 (two) times daily. What changed:  how much to take   multivitamin-prenatal 27-0.8 MG Tabs tablet Take 1 tablet by mouth daily.   omeprazole 40 MG capsule Commonly known as:  PRILOSEC Take 1 capsule (40 mg total) daily by mouth.   oxyCODONE 5 MG immediate release tablet Commonly known as:  Oxy IR/ROXICODONE Take 1 tablet (5 mg total) by mouth every 4 (four) hours as needed (breakthrough pain).       Diet: routine diet  Activity: Advance as tolerated.   Outpatient follow up: Appointment scheduled for 11/08/2017 with Dr. Georgianne Fick.  Disposition: Discharge home.  SIGNED:  Rexene Agent, CNM 11/04/2017 11:49 AM

## 2017-11-08 ENCOUNTER — Encounter: Payer: Self-pay | Admitting: Obstetrics and Gynecology

## 2017-11-08 ENCOUNTER — Ambulatory Visit (INDEPENDENT_AMBULATORY_CARE_PROVIDER_SITE_OTHER): Payer: 59 | Admitting: Obstetrics and Gynecology

## 2017-11-08 VITALS — BP 120/90 | HR 99 | Ht 67.0 in | Wt 223.0 lb

## 2017-11-08 DIAGNOSIS — O1415 Severe pre-eclampsia, complicating the puerperium: Secondary | ICD-10-CM

## 2017-11-08 DIAGNOSIS — Z09 Encounter for follow-up examination after completed treatment for conditions other than malignant neoplasm: Secondary | ICD-10-CM

## 2017-11-08 NOTE — Progress Notes (Signed)
Postoperative Follow-up Patient presents post op from RLTCS 1weeks ago for history of prior C-section.  Subjective: Patient reports marked improvement in her preop symptoms. Eating a regular diet without difficulty. The patient is not having any pain.  Activity: normal activities of daily living.  Objective: Blood pressure 120/90, pulse 99, height _0  (1.702 m), weight 223 lb (101.2 kg), unknown if currently breastfeeding.   Gen: NAD HEENT: normocephalic, anicteric Pulmonary: no increased work of breathing Abdomen: soft, non-tender, non-distended, incision D/C/I Ext: no edema  Admission on 11/02/2017, Discharged on 11/04/2017  Component Date Value Ref Range Status  . WBC 11/02/2017 8.0  3.6 - 11.0 K/uL Final  . RBC 11/02/2017 4.54  3.80 - 5.20 MIL/uL Final  . Hemoglobin 11/02/2017 12.6  12.0 - 16.0 g/dL Final  . HCT 11/02/2017 37.5  35.0 - 47.0 % Final  . MCV 11/02/2017 82.6  80.0 - 100.0 fL Final  . MCH 11/02/2017 27.8  26.0 - 34.0 pg Final  . MCHC 11/02/2017 33.6  32.0 - 36.0 g/dL Final  . RDW 11/02/2017 15.1* 11.5 - 14.5 % Final  . Platelets 11/02/2017 280  150 - 440 K/uL Final   Performed at [facility], [address], [place], [place] [address]  . Sodium 11/02/2017 139  135 - 145 mmol/L Final  . Potassium 11/02/2017 3.5  3.5 - 5.1 mmol/L Final  . Chloride 11/02/2017 107  101 - 111 mmol/L Final  . CO2 11/02/2017 21* 22 - 32 mmol/L Final  . Glucose, Bld 11/02/2017 97  65 - 99 mg/dL Final  . BUN 11/02/2017 12  6 - 20 mg/dL Final  . Creatinine, Ser 11/02/2017 0.70  0.44 - 1.00 mg/dL Final  . Calcium 11/02/2017 8.5* 8.9 - 10.3 mg/dL Final  . Total Protein 11/02/2017 6.5  6.5 - 8.1 g/dL Final  . Albumin 11/02/2017 3.3* 3.5 - 5.0 g/dL Final  . AST 11/02/2017 22  15 - 41 U/L Final  . ALT 11/02/2017 17  14 - 54 U/L Final  . Alkaline Phosphatase 11/02/2017 79  38 - 126 U/L Final  . Total Bilirubin 11/02/2017 0.8  0.3 - 1.2 mg/dL Final  . GFR calc non Af  Amer 11/02/2017 >60  >60 mL/min Final  . GFR calc Af Amer 11/02/2017 >60  >60 mL/min Final   Comment: (NOTE) The eGFR has been calculated using the CKD EPI equation. This calculation has not been validated in all clinical situations. eGFR's persistently <60 mL/min signify possible Chronic Kidney Disease.   Georgiann Hahn gap 11/02/2017 11  5 - 15 Final   Performed at [facility], [address] [place], [place] [address]  . Creatinine, Urine 11/02/2017 156  mg/dL Final  . Total Protein, Urine 11/02/2017 23  mg/dL Final   NO NORMAL RANGE ESTABLISHED FOR THIS TEST  . Protein Creatinine Ratio 11/02/2017 0.15  0.00 - 0.15 mg/mg[Cre] Final   Performed at [facility], [address] [place], [place] [address]    Assessment: 32 y.o. s/p repeat C-section stable  Plan: Patient has done well after surgery with no apparent complications.  I have discussed the post-operative course to date, and the expected progress moving forward.  The patient understands what complications to be concerned about.  I will see the patient in routine follow up, or sooner if needed.    Activity plan: no intercourse of heavy bleeding  - decrease labetalol to 242m bid - follow up BP check in 1 week  Malachy Mood, MD, [facility], [facility] 11/09/2017, 3:56 PM

## 2017-11-15 ENCOUNTER — Telehealth: Payer: Self-pay

## 2017-11-15 NOTE — Telephone Encounter (Signed)
Mallory Pearson w/[employer] calling to verify pt delivery date of 10/27/17 [phone] direct line

## 2017-11-16 NOTE — Telephone Encounter (Signed)
Spoke w/Rachel. Verified Pt delivered 10/27/17 via C-Section and discharged 10/30/17. No further information needed.

## 2017-11-17 ENCOUNTER — Ambulatory Visit (INDEPENDENT_AMBULATORY_CARE_PROVIDER_SITE_OTHER): Payer: 59 | Admitting: Obstetrics and Gynecology

## 2017-11-17 ENCOUNTER — Encounter: Payer: Self-pay | Admitting: Obstetrics and Gynecology

## 2017-11-17 VITALS — BP 132/86 | HR 123 | Wt 226.0 lb

## 2017-11-17 DIAGNOSIS — O1495 Unspecified pre-eclampsia, complicating the puerperium: Secondary | ICD-10-CM

## 2017-11-17 NOTE — Progress Notes (Signed)
Obstetrics & Gynecology Office Visit   Chief Complaint:  Chief Complaint  Patient presents with  . Postpartum Care    Blood pressure check    History of Present Illness: 32 y.o. [id number] with postpartum preeclampsia with severe features, requiring re-admission and magnesium sulfate who presents for BP check.  She is off of all antihypertensives currently and doing well.  No headaches or vision changes.  Home BP readings have been normotensive  Review of Systems: Review of Systems  Constitutional: Negative for chills, fever and malaise/fatigue.  Eyes: Negative for blurred vision.  Respiratory: Negative for shortness of breath.   Cardiovascular: Negative for chest pain.  Neurological: Negative for headaches.    Past Medical History:  Past Medical History:  Diagnosis Date  . Allergy   . GERD (gastroesophageal reflux disease)   . Gestational diabetes   . History of chicken pox   . Hypertension   . Subglottic stenosis   . Tachycardia     Past Surgical History:  Past Surgical History:  Procedure Laterality Date  . CESAREAN SECTION  11/26/11  . CESAREAN SECTION N/A 04/01/2016   Procedure: CESAREAN SECTION;  Surgeon: Gae Dry, MD;  Location: [facility] ORS;  Service: Obstetrics;  Laterality: N/A;  Time of birth 18:50 Sex: Female Weight: 7 lbs, 13 oz   . CESAREAN SECTION N/A 10/27/2017   Procedure: CESAREAN SECTION;  Surgeon: Malachy Mood, MD;  Location: [facility] ORS;  Service: Obstetrics;  Laterality: N/A;  . KNEE SURGERY Left 2005  . LARYNX SURGERY  2018  . TONSILLECTOMY      Gynecologic History: No LMP recorded.  Obstetric History: [id number]  Family History:  No family history on file.  Social History:  Social History   Socioeconomic History  . Marital status: Married    Spouse name: Not on file  . Number of children: Not on file  . Years of education: Not on file  . Highest education level: Not on file  Social Needs  . Financial resource strain: Not on  file  . Food insecurity - worry: Not on file  . Food insecurity - inability: Not on file  . Transportation needs - medical: Not on file  . Transportation needs - non-medical: Not on file  Occupational History  . Occupation: cna    Employer: [employer]  Tobacco Use  . Smoking status: Never Smoker  . Smokeless tobacco: Never Used  Substance and Sexual Activity  . Alcohol use: No    Alcohol/week: 0.0 oz  . Drug use: No  . Sexual activity: Yes  Other Topics Concern  . Not on file  Social History Narrative   Regular exercise--yes    Allergies:  Allergies  Allergen Reactions  . Sulfonamide Derivatives Hives    Medications: Prior to Admission medications   Medication Sig Start Date End Date Taking? Authorizing Provider  ibuprofen (ADVIL,MOTRIN) 600 MG tablet Take 1 tablet (600 mg total) by mouth every 6 (six) hours as needed for mild pain or moderate pain. 10/30/17   Will Bonnet, MD  labetalol (NORMODYNE) 200 MG tablet Take 2 tablets (400 mg total) by mouth 2 (two) times daily. 11/04/17   Rexene Agent, CNM  omeprazole (PRILOSEC) 40 MG capsule Take 1 capsule (40 mg total) daily by mouth. Patient not taking: Reported on 11/02/2017 07/26/17 07/26/18  Malachy Mood, MD  oxyCODONE (OXY IR/ROXICODONE) 5 MG immediate release tablet Take 1 tablet (5 mg total) by mouth every 4 (four) hours as needed (breakthrough  pain). 10/30/17   Will Bonnet, MD  Prenatal Vit-Fe Fumarate-FA (MULTIVITAMIN-PRENATAL) 27-0.8 MG TABS Take 1 tablet by mouth daily.     [provider]    Physical Exam Vitals:  Vitals:   11/17/17 1517  BP: 132/86  Pulse: (!) 123   No LMP recorded.  General: NAD HEENT: normocephalic, anicteric Pulmonary: No increased work of breathing Abdomen: soft, non-tender, incision D/C/I  Extremities: no edema, erythema, or tenderness Neurologic: Grossly intact Psychiatric: mood appropriate, affect full  Female chaperone present for pelvic  portions of  the physical exam  Assessment: 32 y.o. [id number] with postpartum preeclampsia with severe features presenting for BP check  Plan: Problem List Items Addressed This Visit    None     - Normotensive now off all antihypertensives - 6 week postpartum visit with 2-hr OGTT in 4 weeks  Malachy Mood, MD, [place], [facility] 11/19/2017, 8:48 AM

## 2017-12-12 ENCOUNTER — Ambulatory Visit (INDEPENDENT_AMBULATORY_CARE_PROVIDER_SITE_OTHER): Payer: 59 | Admitting: Obstetrics and Gynecology

## 2017-12-12 ENCOUNTER — Encounter: Payer: Self-pay | Admitting: Obstetrics and Gynecology

## 2017-12-12 VITALS — BP 120/84 | HR 92 | Ht 67.0 in | Wt 206.0 lb

## 2017-12-12 DIAGNOSIS — Z113 Encounter for screening for infections with a predominantly sexual mode of transmission: Secondary | ICD-10-CM

## 2017-12-12 DIAGNOSIS — I10 Essential (primary) hypertension: Secondary | ICD-10-CM

## 2017-12-12 NOTE — Progress Notes (Signed)
Postpartum Visit  Chief Complaint:  Chief Complaint  Patient presents with  . Postpartum Care    C/S 2/21  . STD check    History of Present Illness: Patient is a 32 y.o. [id number] presents for postpartum visit.  Date of delivery:  Cesarean Section: Elective repeat Pregnancy or labor problems:  Yes, subglotic stenosis, superimposed preeclampsia, GDM, with postpartum admission for severe preeclampsia requiring magnesium sulfate Any problems since the delivery:  no  Newborn Details:  SINGLETON :  1. Baby's Gender: female. Birth weight: 7lbs 8oz Maternal Details:  Breast Feeding:  yes Post partum depression/anxiety noted:  no Edinburgh Post-Partum Depression Score:  4  Date of last PAP: 04/21/2017 NIL and HR HPV negative   Review of Systems: ROS  The following portions of the patient's history were reviewed and updated as appropriate: allergies, current medications, past family history, past medical history, past social history, past surgical history and problem list.  Past Medical History:  Past Medical History:  Diagnosis Date  . Allergy   . GERD (gastroesophageal reflux disease)   . Gestational diabetes   . History of chicken pox   . Hypertension   . Subglottic stenosis   . Tachycardia     Past Surgical History:  Past Surgical History:  Procedure Laterality Date  . CESAREAN SECTION  11/26/11  . CESAREAN SECTION N/A 04/01/2016   Procedure: CESAREAN SECTION;  Surgeon: Gae Dry, MD;  Location: [facility] ORS;  Service: Obstetrics;  Laterality: N/A;  Time of birth 18:50 Sex: Female Weight: 7 lbs, 13 oz   . CESAREAN SECTION N/A 10/27/2017   Procedure: CESAREAN SECTION;  Surgeon: Malachy Mood, MD;  Location: [facility] ORS;  Service: Obstetrics;  Laterality: N/A;  . KNEE SURGERY Left 2005  . LARYNX SURGERY  2018  . TONSILLECTOMY      Family History:  No family history on file.  Social History:  Social History   Socioeconomic History  . Marital status: Married      Spouse name: Not on file  . Number of children: Not on file  . Years of education: Not on file  . Highest education level: Not on file  Occupational History  . Occupation: cna    Employer: [employer]  Social Needs  . Financial resource strain: Not on file  . Food insecurity:    Worry: Not on file    Inability: Not on file  . Transportation needs:    Medical: Not on file    Non-medical: Not on file  Tobacco Use  . Smoking status: Never Smoker  . Smokeless tobacco: Never Used  Substance and Sexual Activity  . Alcohol use: No    Alcohol/week: 0.0 oz  . Drug use: No  . Sexual activity: Yes  Lifestyle  . Physical activity:    Days per week: Not on file    Minutes per session: Not on file  . Stress: Not on file  Relationships  . Social connections:    Talks on phone: Not on file    Gets together: Not on file    Attends religious service: Not on file    Active member of club or organization: Not on file    Attends meetings of clubs or organizations: Not on file    Relationship status: Not on file  . Intimate partner violence:    Fear of current or ex partner: Not on file    Emotionally abused: Not on file    Physically abused: Not  on file    Forced sexual activity: Not on file  Other Topics Concern  . Not on file  Social History Narrative   Regular exercise--yes    Allergies:  Allergies  Allergen Reactions  . Sulfonamide Derivatives Hives    Medications: Prior to Admission medications   Medication Sig Start Date End Date Taking? Authorizing Provider  Prenatal Vit-Fe Fumarate-FA (MULTIVITAMIN-PRENATAL) 27-0.8 MG TABS Take 1 tablet by mouth daily.    Yes [provider]    Physical Exam Blood pressure 120/84, pulse 92, height 5\' 7"  (1.702 m), weight 206 lb (93.4 kg), currently breastfeeding.  General: NAD HEENT: normocephalic, anicteric Pulmonary: No increased work of breathing Abdomen: NABS, soft, non-tender, non-distended.  Umbilicus without  lesions.  No hepatomegaly, splenomegaly or masses palpable. No evidence of hernia. Incision D/C/I Genitourinary:  External: Normal external female genitalia.  Normal urethral meatus, normal  Bartholin's and Skene's glands.    Vagina: Normal vaginal mucosa, no evidence of prolapse.    Cervix: Grossly normal in appearance, no bleeding  Uterus: Non-enlarged, mobile, normal contour.  No CMT  Adnexa: ovaries non-enlarged, no adnexal masses  Rectal: deferred Extremities: no edema, erythema, or tenderness Neurologic: Grossly intact Psychiatric: mood appropriate, affect full  Edinburgh Postnatal Depression Scale - 12/12/17 1016      Edinburgh Postnatal Depression Scale:  In the Past 7 Days   I have been able to laugh and see the funny side of things.  0    I have looked forward with enjoyment to things.  0    I have blamed myself unnecessarily when things went wrong.  0    I have been anxious or worried for no good reason.  0    I have felt scared or panicky for no good reason.  0    Things have been getting on top of me.  0    I have been so unhappy that I have had difficulty sleeping.  2    I have felt sad or miserable.  1    I have been so unhappy that I have been crying.  1    The thought of harming myself has occurred to me.  0    Edinburgh Postnatal Depression Scale Total  4       Assessment: 32 y.o. [phone] presenting for 6 week postpartum visit  Plan: Problem List Items Addressed This Visit      Cardiovascular and Mediastinum   Essential hypertension    Other Visit Diagnoses    Routine screening for STI (sexually transmitted infection)    -  Primary   Relevant Orders   Chlamydia/Gonococcus/Trichomonas, NAA   HEP, RPR, HIV Panel (Completed)   Encounter for postpartum visit       Relevant Orders   HEP, RPR, HIV Panel (Completed)      1) Contraception - Education given regarding options for contraception, as well as compatibility with breast feeding if applicable.   Patient plans on abstinence for contraception.  2)  Pap - ASCCP guidelines and rational discussed.  ASCCP guidelines and rational discussed.  Patient opts for every 3 years screening interval  3) Patient underwent screening for postpartum depression with no signs of depression  4) Return in about 6 weeks (around 01/23/2018) for F/U., obtain HgbA1C at follow up   Malachy Mood, MD, [place] [place] 12/13/2017, 10:15 AM

## 2017-12-13 ENCOUNTER — Encounter (INDEPENDENT_AMBULATORY_CARE_PROVIDER_SITE_OTHER): Payer: Self-pay

## 2017-12-13 DIAGNOSIS — I10 Essential (primary) hypertension: Secondary | ICD-10-CM | POA: Insufficient documentation

## 2017-12-13 LAB — HEP, RPR, HIV PANEL
HEP B S AG: NEGATIVE
HIV Screen 4th Generation wRfx: NONREACTIVE
RPR: NONREACTIVE

## 2017-12-14 LAB — CHLAMYDIA/GONOCOCCUS/TRICHOMONAS, NAA
CHLAMYDIA BY NAA: NEGATIVE
GONOCOCCUS BY NAA: NEGATIVE
TRICH VAG BY NAA: NEGATIVE

## 2017-12-15 ENCOUNTER — Encounter (INDEPENDENT_AMBULATORY_CARE_PROVIDER_SITE_OTHER): Payer: Self-pay

## 2017-12-19 DIAGNOSIS — H52213 Irregular astigmatism, bilateral: Secondary | ICD-10-CM | POA: Diagnosis not present

## 2017-12-27 DIAGNOSIS — J386 Stenosis of larynx: Secondary | ICD-10-CM | POA: Diagnosis not present

## 2017-12-28 ENCOUNTER — Encounter: Payer: Self-pay | Admitting: Obstetrics and Gynecology

## 2018-01-02 ENCOUNTER — Encounter: Payer: Self-pay | Admitting: Obstetrics and Gynecology

## 2018-01-02 NOTE — Telephone Encounter (Signed)
Letter on your desk

## 2018-01-18 DIAGNOSIS — D2372 Other benign neoplasm of skin of left lower limb, including hip: Secondary | ICD-10-CM | POA: Diagnosis not present

## 2018-01-18 DIAGNOSIS — L0889 Other specified local infections of the skin and subcutaneous tissue: Secondary | ICD-10-CM | POA: Diagnosis not present

## 2018-01-18 DIAGNOSIS — Q828 Other specified congenital malformations of skin: Secondary | ICD-10-CM | POA: Diagnosis not present

## 2018-01-18 DIAGNOSIS — D2339 Other benign neoplasm of skin of other parts of face: Secondary | ICD-10-CM | POA: Diagnosis not present

## 2018-02-01 ENCOUNTER — Encounter: Payer: Self-pay | Admitting: Obstetrics and Gynecology

## 2018-02-01 ENCOUNTER — Ambulatory Visit (INDEPENDENT_AMBULATORY_CARE_PROVIDER_SITE_OTHER): Payer: 59 | Admitting: Obstetrics and Gynecology

## 2018-02-01 VITALS — BP 116/74 | HR 86 | Ht 67.0 in | Wt 201.0 lb

## 2018-02-01 DIAGNOSIS — Z1331 Encounter for screening for depression: Secondary | ICD-10-CM

## 2018-02-01 DIAGNOSIS — O24419 Gestational diabetes mellitus in pregnancy, unspecified control: Secondary | ICD-10-CM | POA: Diagnosis not present

## 2018-02-01 NOTE — Progress Notes (Signed)
Obstetrics & Gynecology Office Visit   Chief Complaint:  Chief Complaint  Patient presents with  . Follow-up    Medication follow up    History of Present Illness: The patient is a 32 y.o. female presenting follow up for depression screening.  The patient is currently taking nothing for the management of her symptoms.  She has  had any recent situational stressors, undergoing separation from her husband postpartum, pregnancy complications and postpartum preeclampsia. She reports doing well.  Currently her husband still resides with her and the children.  She feels like there will be some closure once he actually moves out.  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  Good support sytem in place.  Review of Systems: Review of Systems  Constitutional: Negative.   Psychiatric/Behavioral: Negative for depression, hallucinations, memory loss, substance abuse and suicidal ideas. The patient is not nervous/anxious and does not have insomnia.      Past Medical History:  Past Medical History:  Diagnosis Date  . Allergy   . GERD (gastroesophageal reflux disease)   . Gestational diabetes   . History of chicken pox   . Hypertension   . Subglottic stenosis   . Tachycardia     Past Surgical History:  Past Surgical History:  Procedure Laterality Date  . CESAREAN SECTION  11/26/11  . CESAREAN SECTION N/A 04/01/2016   Procedure: CESAREAN SECTION;  Surgeon: Gae Dry, MD;  Location: [facility] ORS;  Service: Obstetrics;  Laterality: N/A;  Time of birth 18:50 Sex: Female Weight: 7 lbs, 13 oz   . CESAREAN SECTION N/A 10/27/2017   Procedure: CESAREAN SECTION;  Surgeon: Malachy Mood, MD;  Location: [facility] ORS;  Service: Obstetrics;  Laterality: N/A;  . KNEE SURGERY Left 2005  . LARYNX SURGERY  2018  . TONSILLECTOMY      Gynecologic History: Patient's last menstrual period was 01/14/2018.  Obstetric History: [id number]  Family History:  History reviewed. No pertinent family  history.  Social History:  Social History   Socioeconomic History  . Marital status: Legally Separated    Spouse name: Not on file  . Number of children: Not on file  . Years of education: Not on file  . Highest education level: Not on file  Occupational History  . Occupation: cna    Employer: [employer]  Social Needs  . Financial resource strain: Not on file  . Food insecurity:    Worry: Not on file    Inability: Not on file  . Transportation needs:    Medical: Not on file    Non-medical: Not on file  Tobacco Use  . Smoking status: Never Smoker  . Smokeless tobacco: Never Used  Substance and Sexual Activity  . Alcohol use: No    Alcohol/week: 0.0 oz  . Drug use: No  . Sexual activity: Yes  Lifestyle  . Physical activity:    Days per week: Not on file    Minutes per session: Not on file  . Stress: Not on file  Relationships  . Social connections:    Talks on phone: Not on file    Gets together: Not on file    Attends religious service: Not on file    Active member of club or organization: Not on file    Attends meetings of clubs or organizations: Not on file    Relationship status: Not on file  . Intimate partner violence:    Fear of current or ex partner: Not on file  Emotionally abused: Not on file    Physically abused: Not on file    Forced sexual activity: Not on file  Other Topics Concern  . Not on file  Social History Narrative   Regular exercise--yes    Allergies:  Allergies  Allergen Reactions  . Sulfonamide Derivatives Hives    Medications: Prior to Admission medications   Medication Sig Start Date End Date Taking? Authorizing Provider  Prenatal Vit-Fe Fumarate-FA (MULTIVITAMIN-PRENATAL) 27-0.8 MG TABS Take 1 tablet by mouth daily.    Yes [provider]    Physical Exam Vitals:  Vitals:   02/01/18 0856  BP: 116/74  Pulse: 86   Patient's last menstrual period was 01/14/2018.  General: NAD HEENT: normocephalic,  anicteric Pulmonary: No increased work of breathing Neurologic: Grossly intact Psychiatric: mood appropriate, affect full  Edinburgh Postnatal Depression Scale - 12/12/17 1016      Edinburgh Postnatal Depression Scale:  In the Past 7 Days   I have been able to laugh and see the funny side of things.  0    I have looked forward with enjoyment to things.  0    I have blamed myself unnecessarily when things went wrong.  0    I have been anxious or worried for no good reason.  0    I have felt scared or panicky for no good reason.  0    Things have been getting on top of me.  0    I have been so unhappy that I have had difficulty sleeping.  2    I have felt sad or miserable.  1    I have been so unhappy that I have been crying.  1    The thought of harming myself has occurred to me.  0    Edinburgh Postnatal Depression Scale Total  4       GAD 7 : Generalized Anxiety Score 02/01/2018  Nervous, Anxious, on Edge 0  Control/stop worrying 1  Worry too much - different things 3  Trouble relaxing 1  Restless 0  Easily annoyed or irritable 0  Afraid - awful might happen 0  Total GAD 7 Score 5  Anxiety Difficulty Somewhat difficult    Depression screen [facility] 2/9 02/01/2018 10/20/2015  Decreased Interest 0 0  Down, Depressed, Hopeless 1 0  PHQ - 2 Score 1 0  Altered sleeping 1 -  Tired, decreased energy 1 -  Change in appetite 1 -  Feeling bad or failure about yourself  0 -  Trouble concentrating 0 -  Moving slowly or fidgety/restless 0 -  Suicidal thoughts 0 -  PHQ-9 Score 4 -  Difficult doing work/chores Somewhat difficult -    Depression screen [facility] 2/9 02/01/2018 10/20/2015  Decreased Interest 0 0  Down, Depressed, Hopeless 1 0  PHQ - 2 Score 1 0  Altered sleeping 1 -  Tired, decreased energy 1 -  Change in appetite 1 -  Feeling bad or failure about yourself  0 -  Trouble concentrating 0 -  Moving slowly or fidgety/restless 0 -  Suicidal thoughts 0 -  PHQ-9 Score 4 -   Difficult doing work/chores Somewhat difficult -     Assessment: 32 y.o. [id number] depression screen  Plan: Problem List Items Addressed This Visit    None    Visit Diagnoses    Gestational diabetes mellitus (GDM), antepartum, gestational diabetes method of control unspecified    -  Primary   Relevant Orders   Hemoglobin A1c (  Completed)   Depression screening          1) Depression/Anxiety screen today - currently in process of seperating from her husband secondary to his infidelity.  Given that this was in the immediate postpartum period follow up visit today to assess how she is doing.  Reports doing well, feels like she will be more relieved once he is actually moved out but also appropriately concerned about the challenges of single parenting.  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  GAD-7 is 5 PHQ-9 is 4 item 10 is 0  2) Gestational diabetes - 3 month postpartum HgbA1C drawn today  3) Return in about 1 year (around 02/02/2019) for annual.    Malachy Mood, MD, [place], [place]

## 2018-02-02 ENCOUNTER — Encounter (INDEPENDENT_AMBULATORY_CARE_PROVIDER_SITE_OTHER): Payer: Self-pay

## 2018-02-02 LAB — HEMOGLOBIN A1C
Est. average glucose Bld gHb Est-mCnc: 97 mg/dL
HEMOGLOBIN A1C: 5 % (ref 4.8–5.6)

## 2018-03-01 DIAGNOSIS — Z111 Encounter for screening for respiratory tuberculosis: Secondary | ICD-10-CM | POA: Diagnosis not present

## 2018-03-01 DIAGNOSIS — Z0289 Encounter for other administrative examinations: Secondary | ICD-10-CM | POA: Diagnosis not present

## 2018-03-01 DIAGNOSIS — Z0000 Encounter for general adult medical examination without abnormal findings: Secondary | ICD-10-CM | POA: Diagnosis not present

## 2018-03-02 DIAGNOSIS — Z0289 Encounter for other administrative examinations: Secondary | ICD-10-CM | POA: Diagnosis not present

## 2018-03-02 DIAGNOSIS — Z0000 Encounter for general adult medical examination without abnormal findings: Secondary | ICD-10-CM | POA: Diagnosis not present

## 2018-03-02 DIAGNOSIS — Z111 Encounter for screening for respiratory tuberculosis: Secondary | ICD-10-CM | POA: Diagnosis not present

## 2018-03-08 IMAGING — CT CT NECK W/ CM
3 of 4 series · 12 of 33 positions shown, 14 images · IV contrast (isovue)
Comparison: None.

ADDENDUM:
Upon requested re-review, mild irregularity and stenosis subglottic
trachea.

Acute findings discussed with and reconfirmed by Dr.Stamm on
06/02/2017 at [DATE].
CLINICAL DATA: Shortness of breath for 2 weeks.
EXAM:
CT NECK WITH CONTRAST
TECHNIQUE: Multidetector CT imaging of the neck was performed using the
standard protocol following the bolus administration of intravenous
contrast.
CONTRAST:  75 cc Isovue 370

[Series 9: sag neck · sagittal · 0.47mm/px · 5 of 82 slices shown, 6 images]
[im 28/82  bone]
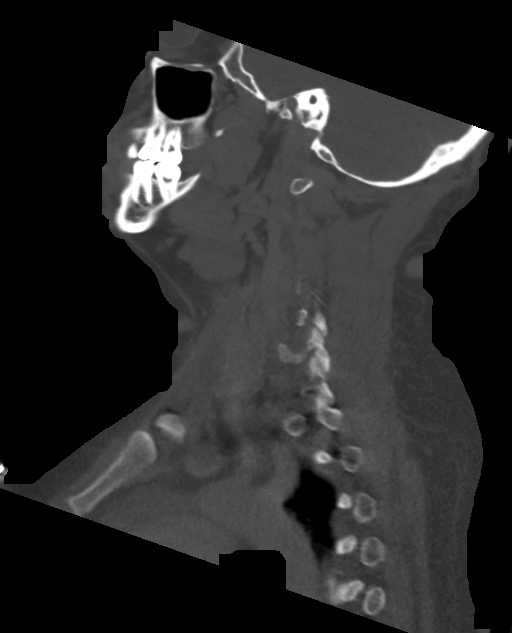
[im 34/82  bone]
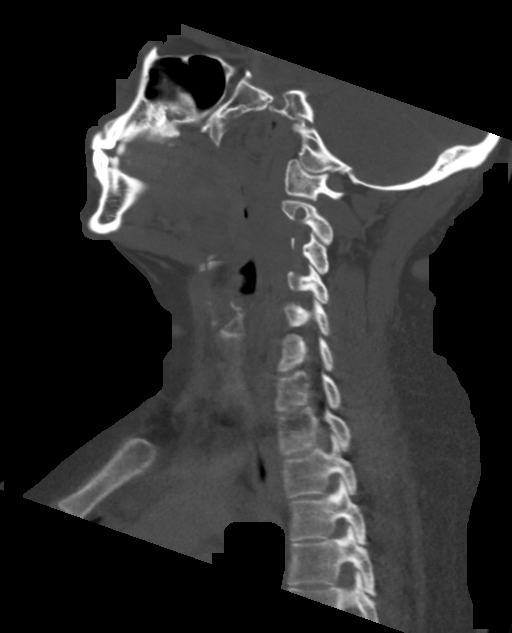
[im 41/82  soft-tissue]
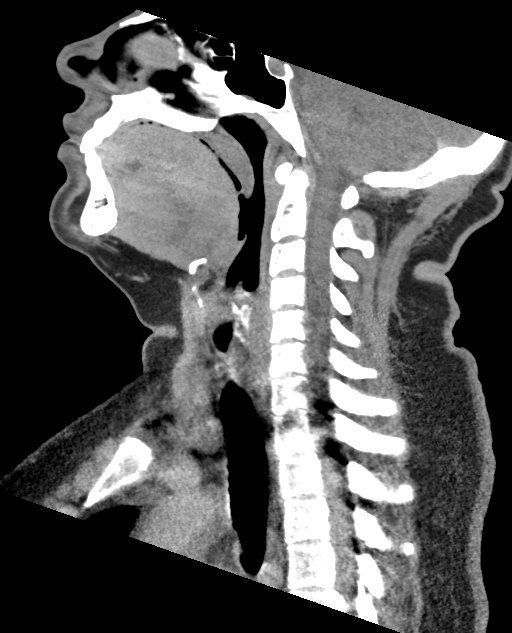
[im 41/82  bone]
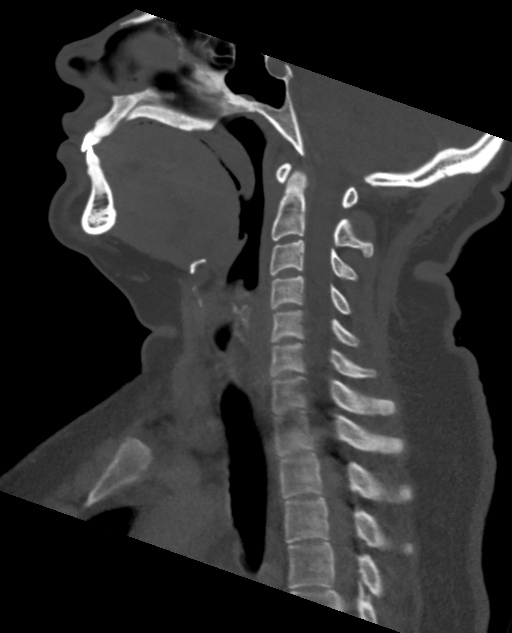
[im 48/82  bone]
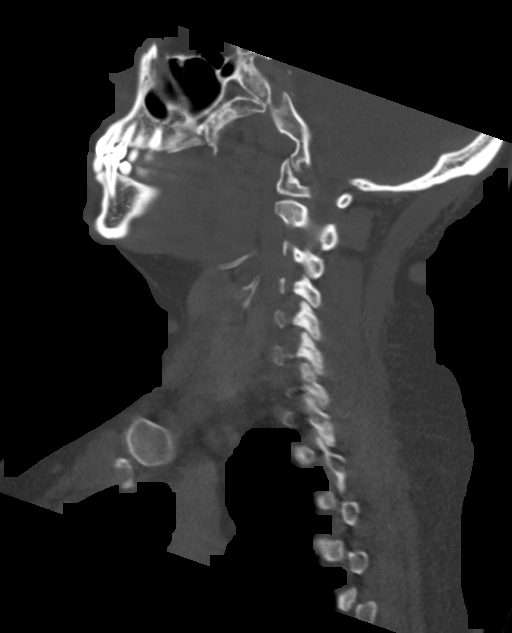
[im 55/82  bone]
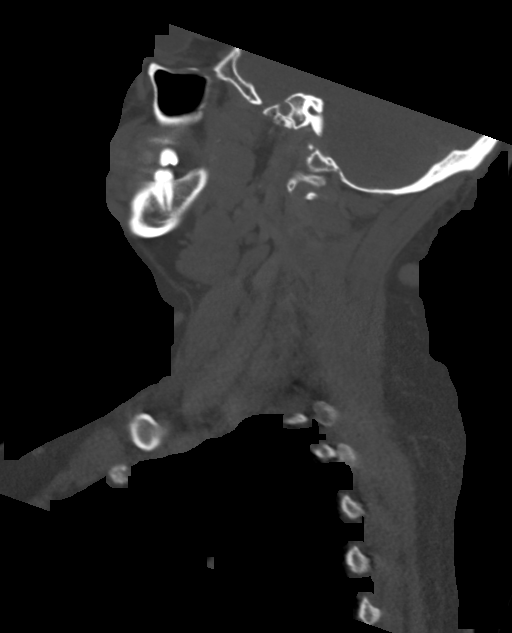

[Series 10: cor neck · coronal · 0.36mm/px · 3 of 114 slices shown]
[im 32/114  bone]
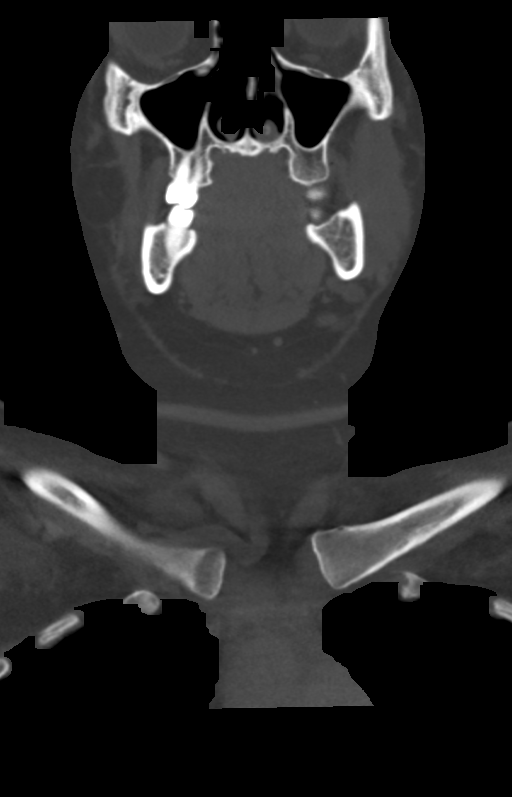
[im 49/114  bone]
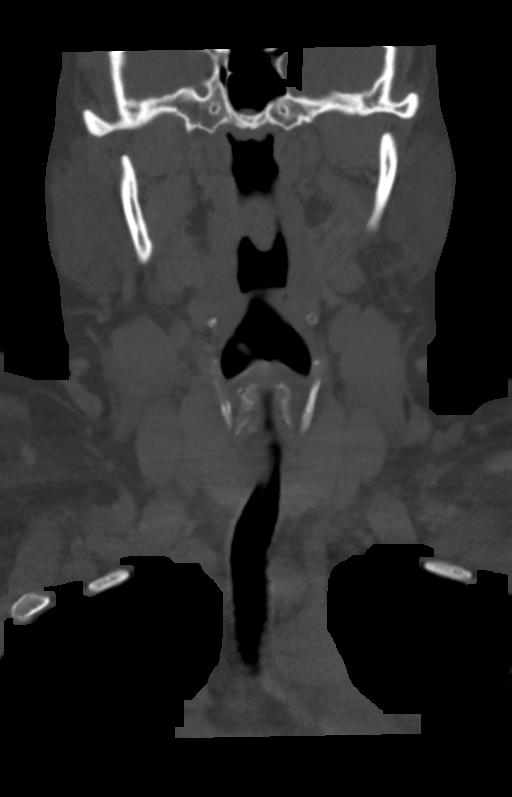
[im 66/114  bone]
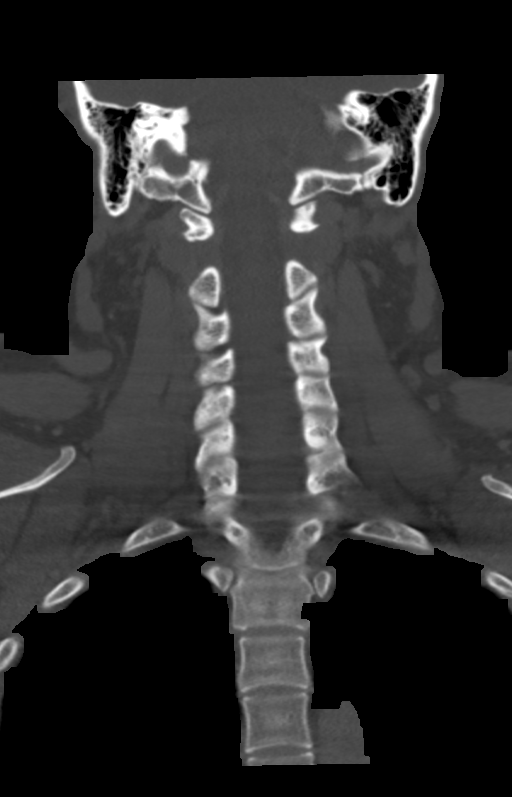

[Series 11: orthogonal ax · axial · 0.38mm/px · z∈[+163,+365]mm · 4 of 147 slices shown, 5 images]
[im 21/147  soft-tissue]
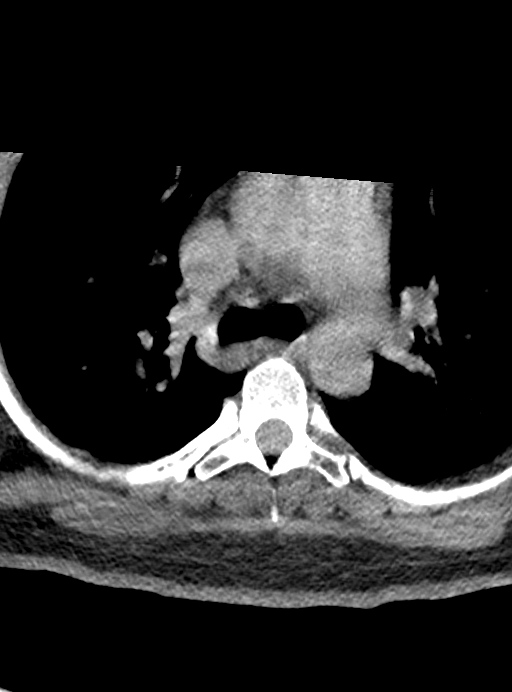
[im 21/147  bone]
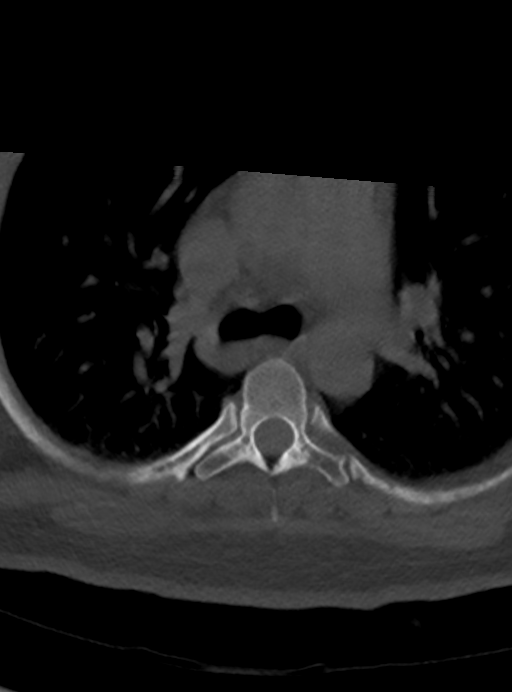
[im 63/147  bone]
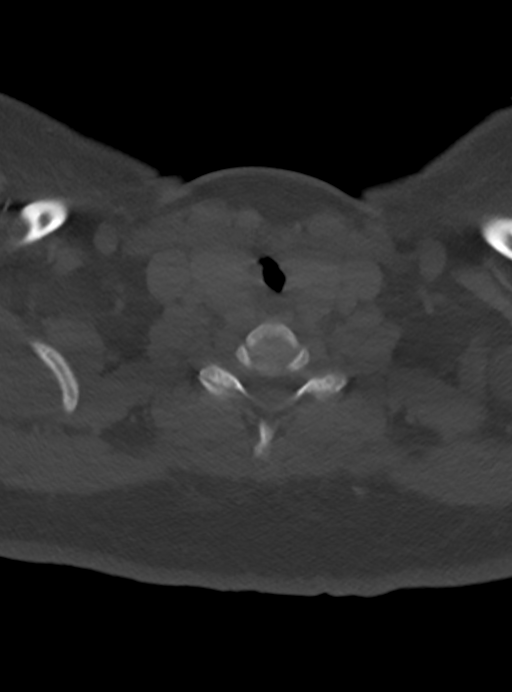
[im 84/147  bone]
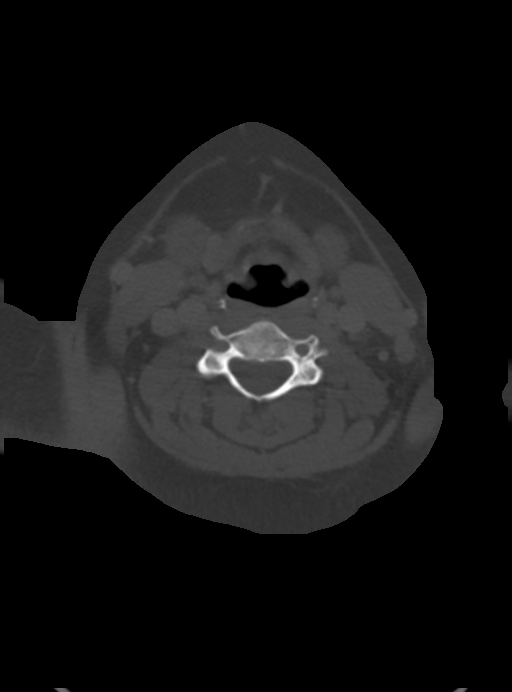
[im 126/147  bone]
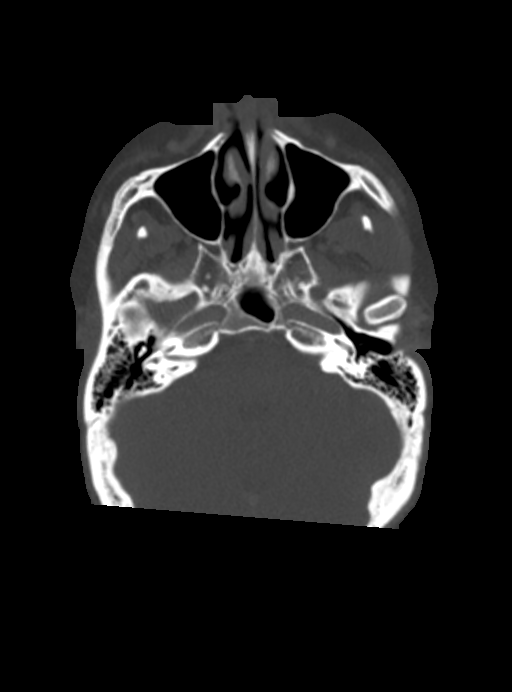

[12 of 33 positions shown; findings below may reference images not displayed]

FINDINGS: PHARYNX AND LARYNX: Normal.  Widely patent airway.

SALIVARY GLANDS: Normal.

THYROID: Mild thyromegaly without nodule.

LYMPH NODES: No lymphadenopathy by CT size criteria.

VASCULAR: Normal.

LIMITED INTRACRANIAL: Normal.

VISUALIZED ORBITS: Normal.

MASTOIDS AND VISUALIZED PARANASAL SINUSES: Well-aerated.

SKELETON: Nonacute.

UPPER CHEST: Lung apices are clear. No superior mediastinal
lymphadenopathy.

OTHER: None.
IMPRESSION: No acute process in the neck.

Mild thyromegaly.

## 2018-03-08 IMAGING — CT CT ANGIO CHEST
1 of 6 series · 19 of 36 positions shown · IV contrast (APPLIED)
Comparison: 05/29/2017 CXR

CLINICAL DATA: Stridor and dyspnea x2 weeks.

EXAM:
CT ANGIOGRAPHY CHEST WITH CONTRAST
TECHNIQUE: Multidetector CT imaging of the chest was performed using the
standard protocol during bolus administration of intravenous
contrast. Multiplanar CT image reconstructions and MIPs were
obtained to evaluate the vascular anatomy.
CONTRAST:  75 cc Isovue 370

[Series 4: thins · axial · 0.71mm/px · z∈[+64,+260]mm · 19 of 218 slices shown]
[im 11/218  lung]
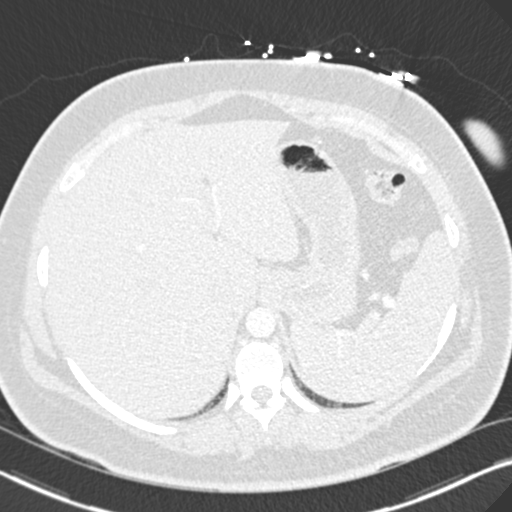
[im 22/218  mediastinal]
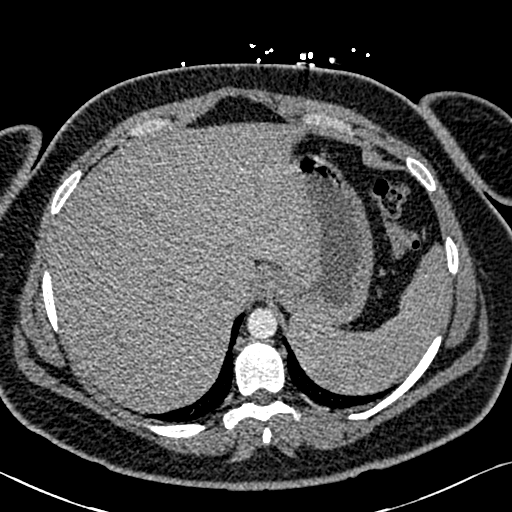
[im 33/218  lung]
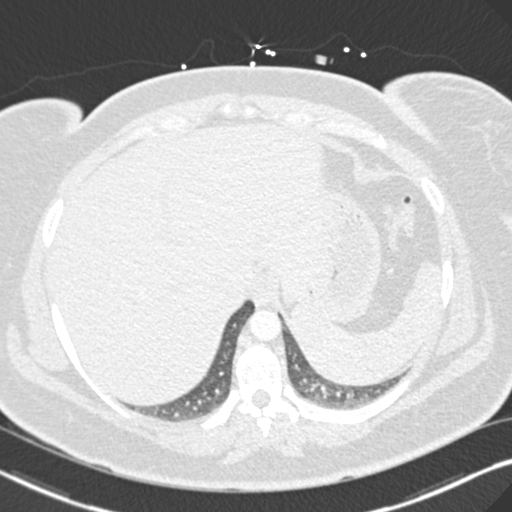
[im 44/218  mediastinal]
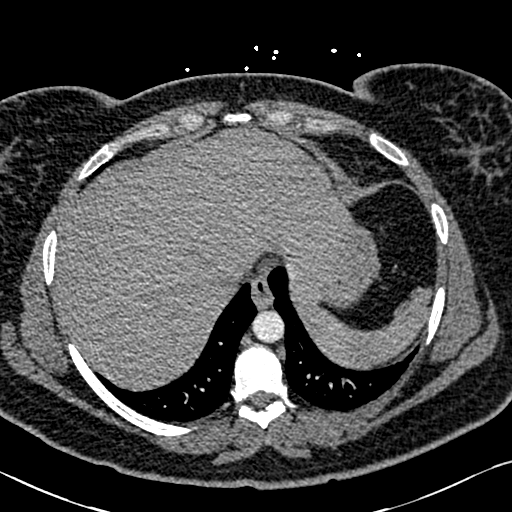
[im 55/218  lung]
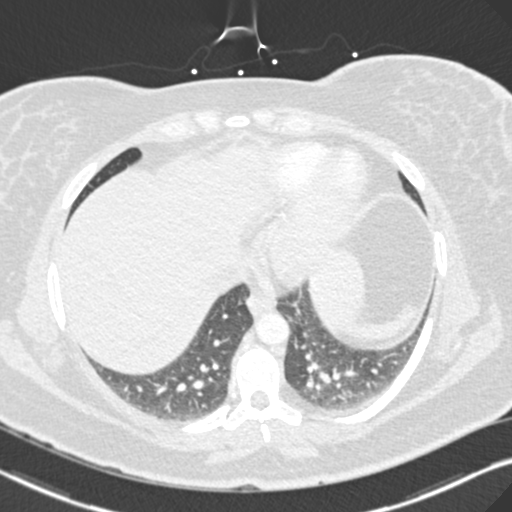
[im 66/218  mediastinal]
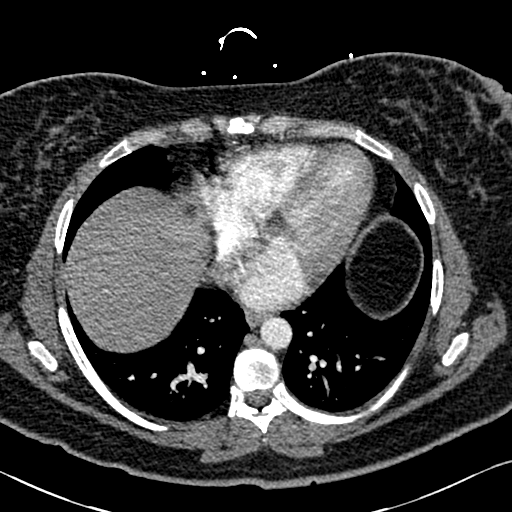
[im 76/218  lung]
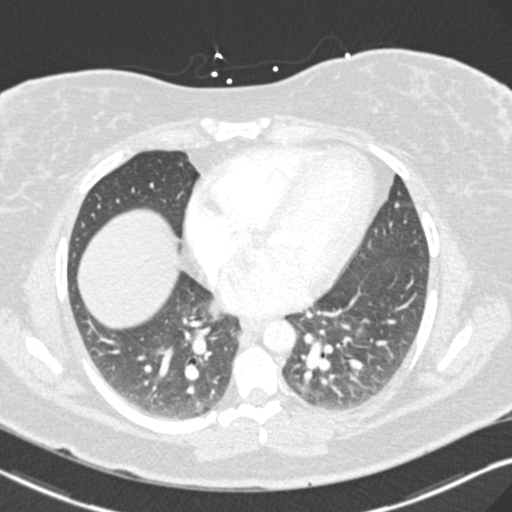
[im 87/218  mediastinal]
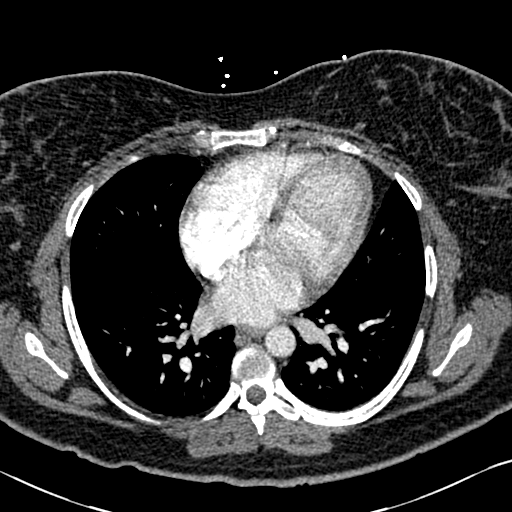
[im 98/218  lung]
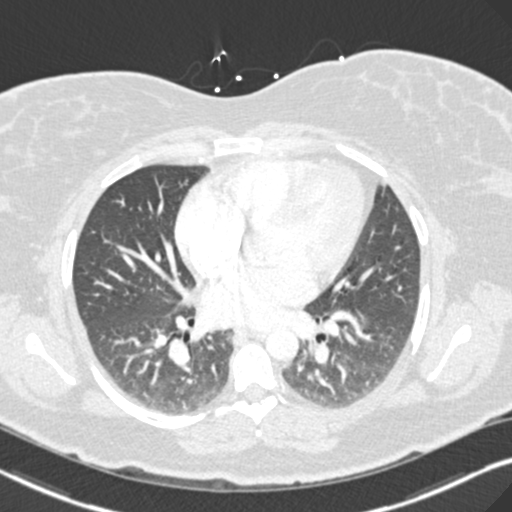
[im 109/218  mediastinal]
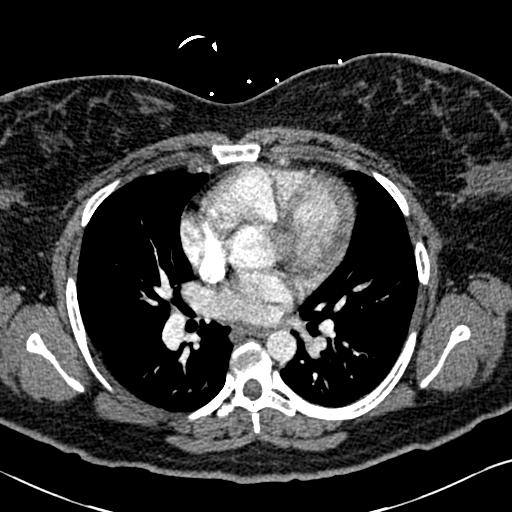
[im 120/218  lung]
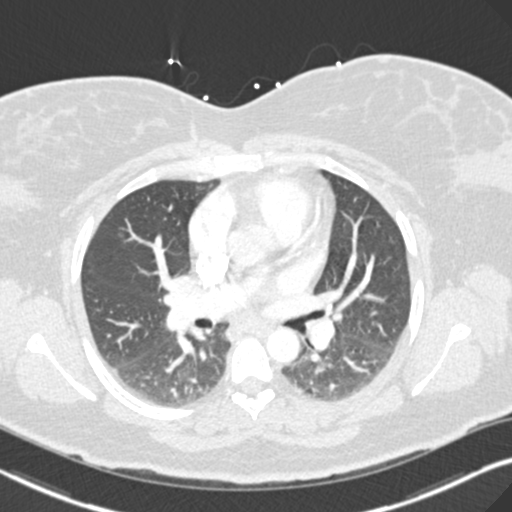
[im 131/218  mediastinal]
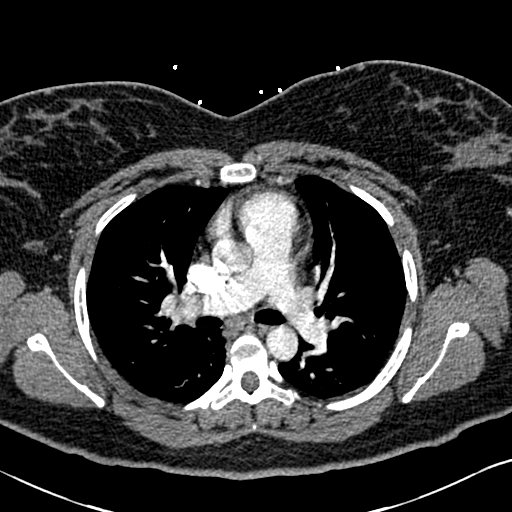
[im 142/218  lung]
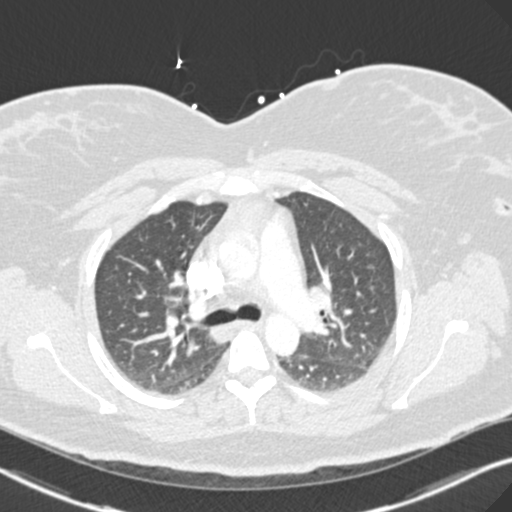
[im 152/218  mediastinal]
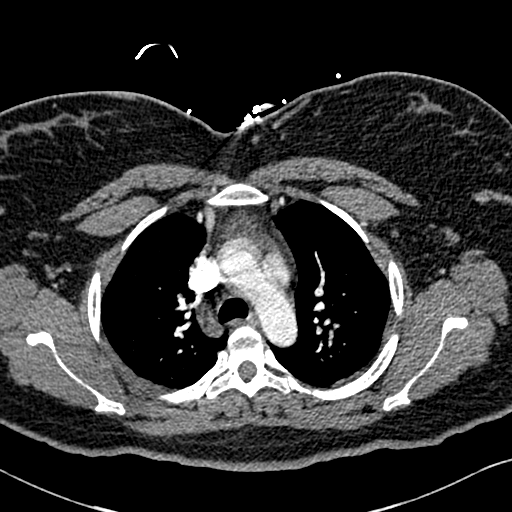
[im 163/218  lung]
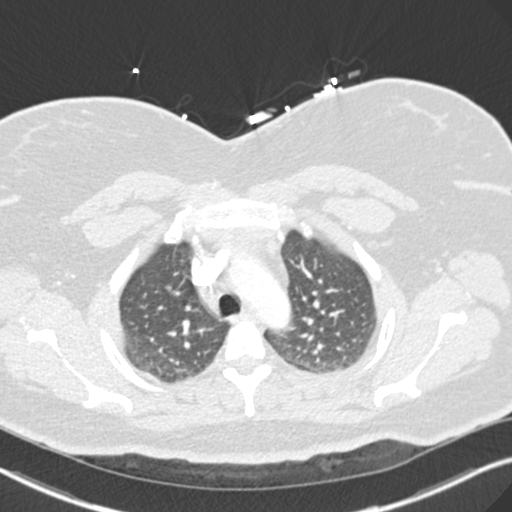
[im 174/218  mediastinal]
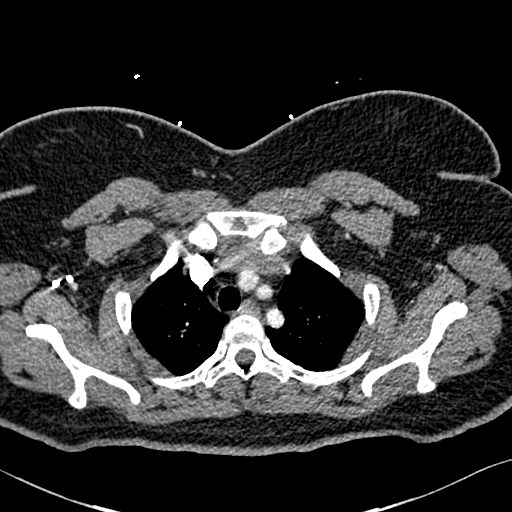
[im 185/218  lung]
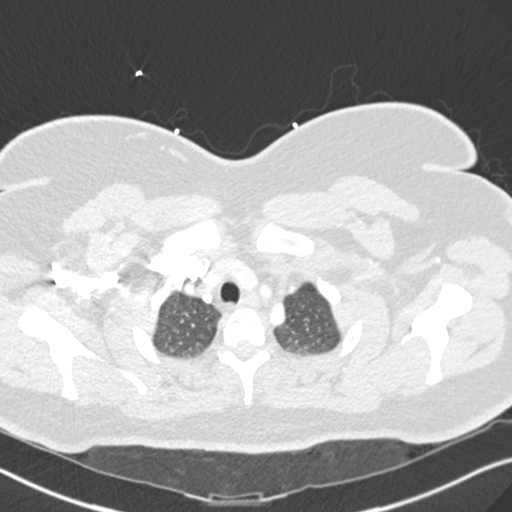
[im 196/218  mediastinal]
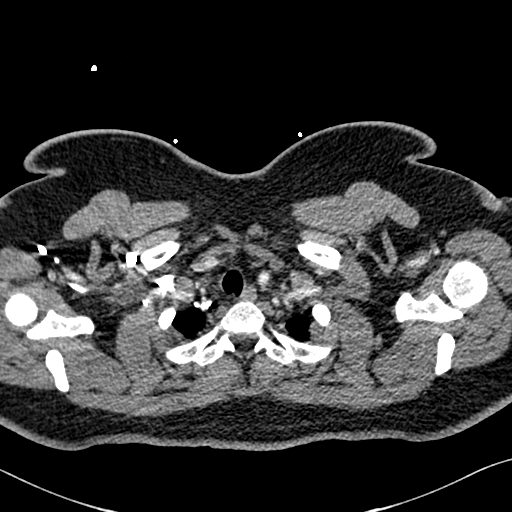
[im 207/218  lung]
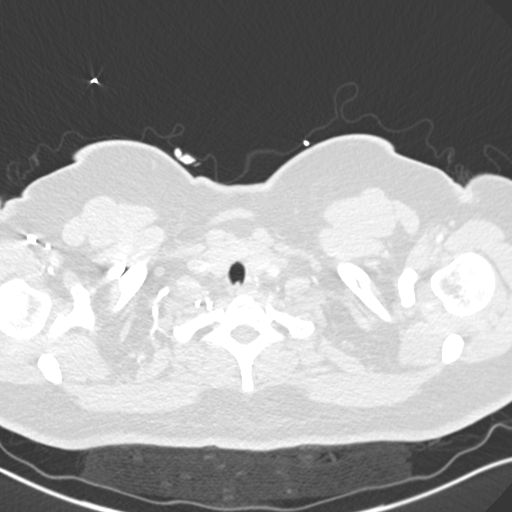

[19 of 36 positions shown; findings below may reference images not displayed]

FINDINGS: Cardiovascular: The study is adequate for the evaluation of
pulmonary embolism to the proximal segmental level. There are no
filling defects in the central, lobar, segmental or subsegmental
pulmonary artery branches to suggest acute pulmonary embolism. Great
vessels are normal in course and caliber. Normal heart size. No
significant pericardial fluid/thickening.

Mediastinum/Nodes: No discrete thyroid nodules. No thyromegaly
though there is slight positive mass effect on the trachea from the
right common series 4, image 1. Probable residual thymic tissue is
paramediastinal. Unremarkable esophagus. No pathologically enlarged
axillary, mediastinal or hilar lymph nodes. No aortic aneurysm or
dissection.

Lungs/Pleura: No pneumothorax. No pleural effusion. Bibasilar
dependent atelectasis. No dominant mass or pneumonic consolidation.

Upper abdomen: Unremarkable.

Musculoskeletal:  No aggressive appearing focal osseous lesions.

Review of the MIP images confirms the above findings.
IMPRESSION: 1. No acute pulmonary embolus.
2. Although there is no thyromegaly, there is minimal positive mass
effect on the right margin of the trachea at the thyroid low without
causing significant luminal narrowing.
3. Bibasilar dependent atelectasis.

## 2018-03-22 ENCOUNTER — Encounter: Payer: Self-pay | Admitting: Obstetrics and Gynecology

## 2018-03-22 ENCOUNTER — Other Ambulatory Visit: Payer: Self-pay | Admitting: Obstetrics and Gynecology

## 2018-03-22 MED ORDER — NORETHINDRONE 0.35 MG PO TABS: 1.0000 | ORAL_TABLET | Freq: Every day | ORAL | 11 refills | Status: DC

## 2018-04-18 DIAGNOSIS — J386 Stenosis of larynx: Secondary | ICD-10-CM | POA: Diagnosis not present

## 2018-04-27 DIAGNOSIS — R0981 Nasal congestion: Secondary | ICD-10-CM | POA: Diagnosis not present

## 2018-05-04 DIAGNOSIS — B9689 Other specified bacterial agents as the cause of diseases classified elsewhere: Secondary | ICD-10-CM | POA: Diagnosis not present

## 2018-05-04 DIAGNOSIS — J329 Chronic sinusitis, unspecified: Secondary | ICD-10-CM | POA: Diagnosis not present

## 2018-05-06 ENCOUNTER — Telehealth: Payer: 59 | Admitting: Family

## 2018-05-06 DIAGNOSIS — J329 Chronic sinusitis, unspecified: Secondary | ICD-10-CM

## 2018-05-06 DIAGNOSIS — B9689 Other specified bacterial agents as the cause of diseases classified elsewhere: Secondary | ICD-10-CM | POA: Diagnosis not present

## 2018-05-06 MED ORDER — AMOXICILLIN-POT CLAVULANATE 875-125 MG PO TABS: 1.0000 | ORAL_TABLET | Freq: Two times a day (BID) | ORAL | 0 refills | Status: AC

## 2018-05-06 NOTE — Progress Notes (Signed)

## 2018-05-11 ENCOUNTER — Telehealth: Payer: 59 | Admitting: Physician Assistant

## 2018-05-11 DIAGNOSIS — H1010 Acute atopic conjunctivitis, unspecified eye: Secondary | ICD-10-CM

## 2018-05-11 NOTE — Progress Notes (Signed)
We are sorry that you are not feeling well.  Here is how we plan to help!  Based on what you have shared with me it looks like you have conjunctivitis.  Conjunctivitis is a common inflammatory or infectious condition of the eye that is often referred to as "pink eye".  In most cases it is contagious (viral or bacterial). However, not all conjunctivitis requires antibiotics (ex. Allergic).  We have made appropriate suggestions for you based upon your presentation.  I recommend that you use OpconA, 1-2 drops every 4-6 hours (an over the counter allergy drop available at your local pharmacy).  Your pharmacist may have an alternative suggestion.  Pink eye can be highly contagious.  It is typically spread through direct contact with secretions, or contaminated objects or surfaces that one may have touched.  Strict handwashing is suggested with soap and water is urged.  If not available, use alcohol based had sanitizer.  Avoid unnecessary touching of the eye.  If you wear contact lenses, you will need to refrain from wearing them until you see no white discharge from the eye for at least 24 hours after being on medication.  You should see symptom improvement in 1-2 days after starting the medication regimen.  Call us if symptoms are not improved in 1-2 days.  Home Care:  Wash your hands often!  Do not wear your contacts until you complete your treatment plan.  Avoid sharing towels, bed linen, personal items with a person who has pink eye.  See attention for anyone in your home with similar symptoms.  Get Help Right Away If:  Your symptoms do not improve.  You develop blurred or loss of vision.  Your symptoms worsen (increased discharge, pain or redness)  Your e-visit answers were reviewed by a board certified advanced clinical practitioner to complete your personal care plan.  Depending on the condition, your plan could have included both over the counter or prescription medications.  If there  is a problem please reply  once you have received a response from your provider.  Your safety is important to us.  If you have drug allergies check your prescription carefully.    You can use MyChart to ask questions about today's visit, request a non-urgent call back, or ask for a work or school excuse for 24 hours related to this e-Visit. If it has been greater than 24 hours you will need to follow up with your provider, or enter a new e-Visit to address those concerns.   You will get an e-mail in the next two days asking about your experience.  I hope that your e-visit has been valuable and will speed your recovery. Thank you for using e-visits.     

## 2018-05-25 DIAGNOSIS — H1045 Other chronic allergic conjunctivitis: Secondary | ICD-10-CM | POA: Diagnosis not present

## 2018-06-07 DIAGNOSIS — J309 Allergic rhinitis, unspecified: Secondary | ICD-10-CM | POA: Diagnosis not present

## 2018-06-22 NOTE — Telephone Encounter (Signed)
Working sometime in the next week

## 2018-06-30 ENCOUNTER — Ambulatory Visit: Payer: 59 | Admitting: Obstetrics and Gynecology

## 2018-07-04 DIAGNOSIS — J301 Allergic rhinitis due to pollen: Secondary | ICD-10-CM | POA: Diagnosis not present

## 2018-07-05 ENCOUNTER — Ambulatory Visit (INDEPENDENT_AMBULATORY_CARE_PROVIDER_SITE_OTHER): Payer: 59 | Admitting: Obstetrics and Gynecology

## 2018-07-05 ENCOUNTER — Encounter: Payer: Self-pay | Admitting: Obstetrics and Gynecology

## 2018-07-05 VITALS — BP 132/88 | HR 102 | Ht 67.0 in | Wt 204.0 lb

## 2018-07-05 DIAGNOSIS — F329 Major depressive disorder, single episode, unspecified: Secondary | ICD-10-CM | POA: Diagnosis not present

## 2018-07-05 DIAGNOSIS — F419 Anxiety disorder, unspecified: Secondary | ICD-10-CM | POA: Diagnosis not present

## 2018-07-05 DIAGNOSIS — F418 Other specified anxiety disorders: Secondary | ICD-10-CM | POA: Diagnosis not present

## 2018-07-05 MED ORDER — ESCITALOPRAM OXALATE 10 MG PO TABS: 10.0000 mg | ORAL_TABLET | Freq: Every day | ORAL | 3 refills | Status: DC

## 2018-07-05 NOTE — Progress Notes (Signed)
Obstetrics & Gynecology Office Visit   Chief Complaint:  Chief Complaint  Patient presents with  . Follow-up    Medication follow up     History of Present Illness: The patient is a 32 y.o. female presenting initial evaluation for symptoms of anxiety and depression.  The patient is currently taking nothing for the management of her symptoms.  She has had any recent situational stressors, recent complicated pregnancy, starting nursing school, husbands infidelity.  She reports symptoms of anhedonia, day time somnolence, insomnia, irritability, social anxiety, feelings of guilt and feelings of worthlessness.  She denies insomnia, suicidal ideation, homicidal ideation, auditory hallucinations and visual hallucinations. Symptoms have worsened since last visit.     The patient does not have a pre-existing history of depression and anxiety.  She  does not a prior history of suicide attempts.   Review of Systems: Review of Systems  Psychiatric/Behavioral: Positive for depression. Negative for hallucinations, memory loss, substance abuse and suicidal ideas. The patient is nervous/anxious and has insomnia.   All other systems reviewed and are negative.   Past Medical History:  Past Medical History:  Diagnosis Date  . Allergy   . GERD (gastroesophageal reflux disease)   . Gestational diabetes   . History of chicken pox   . Hypertension   . Subglottic stenosis   . Tachycardia     Past Surgical History:  Past Surgical History:  Procedure Laterality Date  . CESAREAN SECTION  11/26/11  . CESAREAN SECTION N/A 04/01/2016   Procedure: CESAREAN SECTION;  Surgeon: Gae Dry, MD;  Location: [facility] ORS;  Service: Obstetrics;  Laterality: N/A;  Time of birth 18:[birth year] Sex: Female Weight: 7 lbs, 13 oz   . CESAREAN SECTION N/A 10/27/2017   Procedure: CESAREAN SECTION;  Surgeon: Malachy Mood, MD;  Location: [facility] ORS;  Service: Obstetrics;  Laterality: N/A;  . KNEE SURGERY Left 2005  . LARYNX  SURGERY  2018  . TONSILLECTOMY      Gynecologic History: Patient's last menstrual period was 06/25/2018.  Obstetric History: [id number]  Family History:  History reviewed. No pertinent family history.  Social History:  Social History   Socioeconomic History  . Marital status: Married    Spouse name: Not on file  . Number of children: Not on file  . Years of education: Not on file  . Highest education level: Not on file  Occupational History  . Occupation: cna    Employer: [employer]  Social Needs  . Financial resource strain: Not on file  . Food insecurity:    Worry: Not on file    Inability: Not on file  . Transportation needs:    Medical: Not on file    Non-medical: Not on file  Tobacco Use  . Smoking status: Never Smoker  . Smokeless tobacco: Never Used  Substance and Sexual Activity  . Alcohol use: No    Alcohol/week: 0.0 standard drinks  . Drug use: No  . Sexual activity: Yes  Lifestyle  . Physical activity:    Days per week: Not on file    Minutes per session: Not on file  . Stress: Not on file  Relationships  . Social connections:    Talks on phone: Not on file    Gets together: Not on file    Attends religious service: Not on file    Active member of club or organization: Not on file    Attends meetings of clubs or organizations: Not on file  Relationship status: Not on file  . Intimate partner violence:    Fear of current or ex partner: Not on file    Emotionally abused: Not on file    Physically abused: Not on file    Forced sexual activity: Not on file  Other Topics Concern  . Not on file  Social History Narrative   Regular exercise--yes    Allergies:  Allergies  Allergen Reactions  . Sulfonamide Derivatives Hives    Medications: Prior to Admission medications   Medication Sig Start Date End Date Taking? Authorizing Provider  Multiple Vitamins-Minerals (HAIR SKIN AND NAILS FORMULA PO) Take by mouth.   Yes [provider]     Physical Exam Vitals:  Vitals:   07/05/18 1456  BP: 132/88  Pulse: (!) 102   Patient's last menstrual period was 06/25/2018.  General: NAD HEENT: normocephalic, anicteric Pulmonary: No increased work of breathing Neurologic: Grossly intact Psychiatric: mood appropriate, affect full    GAD 7 : Generalized Anxiety Score 07/05/2018 02/01/2018  Nervous, Anxious, on Edge 3 0  Control/stop worrying 3 1  Worry too much - different things 3 3  Trouble relaxing 1 1  Restless 0 0  Easily annoyed or irritable 2 0  Afraid - awful might happen 0 0  Total GAD 7 Score 12 5  Anxiety Difficulty Somewhat difficult Somewhat difficult    Depression screen [facility] 2/9 07/05/2018 02/01/2018 10/20/2015  Decreased Interest 2 0 0  Down, Depressed, Hopeless 2 1 0  PHQ - 2 Score 4 1 0  Altered sleeping 0 1 -  Tired, decreased energy 3 1 -  Change in appetite 1 1 -  Feeling bad or failure about yourself  3 0 -  Trouble concentrating 0 0 -  Moving slowly or fidgety/restless 0 0 -  Suicidal thoughts 1 0 -  PHQ-9 Score 12 4 -  Difficult doing work/chores Somewhat difficult Somewhat difficult -    Depression screen [facility] 2/9 07/05/2018 02/01/2018 10/20/2015  Decreased Interest 2 0 0  Down, Depressed, Hopeless 2 1 0  PHQ - 2 Score 4 1 0  Altered sleeping 0 1 -  Tired, decreased energy 3 1 -  Change in appetite 1 1 -  Feeling bad or failure about yourself  3 0 -  Trouble concentrating 0 0 -  Moving slowly or fidgety/restless 0 0 -  Suicidal thoughts 1 0 -  PHQ-9 Score 12 4 -  Difficult doing work/chores Somewhat difficult Somewhat difficult -     Assessment: 32 y.o. [id number] initial evaluation anxiety and depression  Plan: Problem List Items Addressed This Visit    None    Visit Diagnoses    Anxiety and depression    -  Primary   Relevant Medications   escitalopram (LEXAPRO) 10 MG tablet   Other Relevant Orders   CBC   TSH   B12      1) Start lexapro 10mg .  Husband living with  patient again to help with kids.  She is curently going to therapy.  Has started nursing school.  2) Thyroid and B12 screen has not been obtained previously - obtained today  Malachy Mood, MD, [place], [place] 07/05/2018, 3:00 PM

## 2018-07-06 DIAGNOSIS — J309 Allergic rhinitis, unspecified: Secondary | ICD-10-CM | POA: Diagnosis not present

## 2018-07-06 LAB — VITAMIN B12: Vitamin B-12: 700 pg/mL (ref 232–1245)

## 2018-07-06 LAB — CBC
Hematocrit: 42.4 % (ref 34.0–46.6)
Hemoglobin: 13.9 g/dL (ref 11.1–15.9)
MCH: 26.5 pg — ABNORMAL LOW (ref 26.6–33.0)
MCHC: 32.8 g/dL (ref 31.5–35.7)
MCV: 81 fL (ref 79–97)
PLATELETS: 278 10*3/uL (ref 150–450)
RBC: 5.25 x10E6/uL (ref 3.77–5.28)
RDW: 13.7 % (ref 12.3–15.4)
WBC: 9.1 10*3/uL (ref 3.4–10.8)

## 2018-07-06 LAB — TSH: TSH: 1.41 u[IU]/mL (ref 0.450–4.500)

## 2018-08-01 ENCOUNTER — Encounter: Payer: Self-pay | Admitting: Obstetrics and Gynecology

## 2018-08-01 ENCOUNTER — Ambulatory Visit (INDEPENDENT_AMBULATORY_CARE_PROVIDER_SITE_OTHER): Payer: 59 | Admitting: Obstetrics and Gynecology

## 2018-08-01 VITALS — BP 134/84 | HR 95 | Wt 216.0 lb

## 2018-08-01 DIAGNOSIS — F419 Anxiety disorder, unspecified: Secondary | ICD-10-CM

## 2018-08-01 DIAGNOSIS — F329 Major depressive disorder, single episode, unspecified: Secondary | ICD-10-CM | POA: Diagnosis not present

## 2018-08-01 MED ORDER — BUPROPION HCL ER (XL) 150 MG PO TB24: 150.0000 mg | ORAL_TABLET | Freq: Every day | ORAL | 2 refills | Status: DC

## 2018-08-01 NOTE — Progress Notes (Signed)
Obstetrics & Gynecology Office Visit   Chief Complaint:  Chief Complaint  Patient presents with  . Follow-up    History of Present Illness: The patient is a 32 y.o. female presenting follow up for symptoms of anxiety and depression.  The patient is currently taking Lexapro 10mg  for the management of her symptoms.  She has had any recent situational stressors, unchanged from prior visit (school, relationship).  She reports symptoms of anhedonia, day time somnolence, irritability and social anxiety.  She denies risk taking behavior, suicidal ideation, homicidal ideation, auditory hallucinations and visual hallucinations. Symptoms have remained unchanged since last visit.     She has noted significant fatigue on Lexapro.  Review of Systems: Review of Systems  Constitutional: Positive for malaise/fatigue. Negative for chills, diaphoresis, fever and weight loss.  Gastrointestinal: Negative for nausea.  Neurological: Negative for headaches.  Psychiatric/Behavioral: Positive for depression. Negative for hallucinations, substance abuse and suicidal ideas. The patient is nervous/anxious. The patient does not have insomnia.      Past Medical History:  Past Medical History:  Diagnosis Date  . Allergy   . GERD (gastroesophageal reflux disease)   . Gestational diabetes   . History of chicken pox   . Hypertension   . Subglottic stenosis   . Tachycardia     Past Surgical History:  Past Surgical History:  Procedure Laterality Date  . CESAREAN SECTION  11/26/11  . CESAREAN SECTION N/A 04/01/2016   Procedure: CESAREAN SECTION;  Surgeon: Gae Dry, MD;  Location: [facility] ORS;  Service: Obstetrics;  Laterality: N/A;  Time of birth 18:[birth year] Sex: Female Weight: 7 lbs, 13 oz   . CESAREAN SECTION N/A 10/27/2017   Procedure: CESAREAN SECTION;  Surgeon: Malachy Mood, MD;  Location: [facility] ORS;  Service: Obstetrics;  Laterality: N/A;  . KNEE SURGERY Left 2005  . LARYNX SURGERY  2018  .  TONSILLECTOMY      Gynecologic History: Patient's last menstrual period was 07/30/2018.  Obstetric History: [id number]  Family History:  History reviewed. No pertinent family history.  Social History:  Social History   Socioeconomic History  . Marital status: Married    Spouse name: Not on file  . Number of children: Not on file  . Years of education: Not on file  . Highest education level: Not on file  Occupational History  . Occupation: cna    Employer: [employer]  Social Needs  . Financial resource strain: Not on file  . Food insecurity:    Worry: Not on file    Inability: Not on file  . Transportation needs:    Medical: Not on file    Non-medical: Not on file  Tobacco Use  . Smoking status: Never Smoker  . Smokeless tobacco: Never Used  Substance and Sexual Activity  . Alcohol use: No    Alcohol/week: 0.0 standard drinks  . Drug use: No  . Sexual activity: Yes  Lifestyle  . Physical activity:    Days per week: Not on file    Minutes per session: Not on file  . Stress: Not on file  Relationships  . Social connections:    Talks on phone: Not on file    Gets together: Not on file    Attends religious service: Not on file    Active member of club or organization: Not on file    Attends meetings of clubs or organizations: Not on file    Relationship status: Not on file  . Intimate partner violence:  Fear of current or ex partner: Not on file    Emotionally abused: Not on file    Physically abused: Not on file    Forced sexual activity: Not on file  Other Topics Concern  . Not on file  Social History Narrative   Regular exercise--yes    Allergies:  Allergies  Allergen Reactions  . Sulfonamide Derivatives Hives    Medications: Prior to Admission medications   Medication Sig Start Date End Date Taking? Authorizing Provider  Multiple Vitamins-Minerals (HAIR SKIN AND NAILS FORMULA PO) Take by mouth.   Yes [provider]  buPROPion (WELLBUTRIN  XL) 150 MG 24 hr tablet Take 1 tablet (150 mg total) by mouth daily. 08/01/18   Malachy Mood, MD    Physical Exam Vitals:  Vitals:   08/01/18 1610  BP: 134/84  Pulse: 95   Patient's last menstrual period was 07/30/2018.  General: NAD HEENT: normocephalic, anicteric Pulmonary: No increased work of breathing Neurologic: Grossly intact Psychiatric: mood appropriate, affect full    GAD 7 : Generalized Anxiety Score 08/01/2018 07/05/2018 02/01/2018  Nervous, Anxious, on Edge 0 3 0  Control/stop worrying 0 3 1  Worry too much - different things 0 3 3  Trouble relaxing 1 1 1   Restless 1 0 0  Easily annoyed or irritable 1 2 0  Afraid - awful might happen 0 0 0  Total GAD 7 Score 3 12 5   Anxiety Difficulty Not difficult at all Somewhat difficult Somewhat difficult    Depression screen [facility] 2/9 08/01/2018 07/05/2018 02/01/2018  Decreased Interest 1 2 0  Down, Depressed, Hopeless 0 2 1  PHQ - 2 Score 1 4 1   Altered sleeping 3 0 1  Tired, decreased energy 3 3 1   Change in appetite 1 1 1   Feeling bad or failure about yourself  1 3 0  Trouble concentrating 0 0 0  Moving slowly or fidgety/restless 1 0 0  Suicidal thoughts 0 1 0  PHQ-9 Score 10 12 4   Difficult doing work/chores Somewhat difficult Somewhat difficult Somewhat difficult    Depression screen [facility] 2/9 08/01/2018 07/05/2018 02/01/2018 10/20/2015  Decreased Interest 1 2 0 0  Down, Depressed, Hopeless 0 2 1 0  PHQ - 2 Score 1 4 1  0  Altered sleeping 3 0 1 -  Tired, decreased energy 3 3 1  -  Change in appetite 1 1 1  -  Feeling bad or failure about yourself  1 3 0 -  Trouble concentrating 0 0 0 -  Moving slowly or fidgety/restless 1 0 0 -  Suicidal thoughts 0 1 0 -  PHQ-9 Score 10 12 4  -  Difficult doing work/chores Somewhat difficult Somewhat difficult Somewhat difficult -     Assessment: 32 y.o. [id number] follow up anxiety, and depression  Plan: Problem List Items Addressed This Visit    None    Visit  Diagnoses    Anxiety and depression    -  Primary   Relevant Medications   buPROPion (WELLBUTRIN XL) 150 MG 24 hr tablet      1) Anxiety/Depression - reports fatigue on Lexapro.  There was also minmal improvement in symptoms.  Rather than increasing Lexapro will switch to Wellbutrin. - PHQ-9  10 and GAD-7 3  2) Thyroid and B12 screen has been obtained previously  3) A total of 15 minutes were spent in face-to-face contact with the patient during this encounter with over half of that time devoted to counseling and coordination of care.  4) Return in about 4 weeks (around 08/29/2018) for medication follow up.   Malachy Mood, MD, [facility], [place]

## 2018-08-22 DIAGNOSIS — J386 Stenosis of larynx: Secondary | ICD-10-CM | POA: Diagnosis not present

## 2018-09-01 ENCOUNTER — Ambulatory Visit (INDEPENDENT_AMBULATORY_CARE_PROVIDER_SITE_OTHER): Payer: 59 | Admitting: Obstetrics and Gynecology

## 2018-09-01 ENCOUNTER — Encounter: Payer: Self-pay | Admitting: Obstetrics and Gynecology

## 2018-09-01 VITALS — BP 122/82 | Ht 67.0 in | Wt 218.0 lb

## 2018-09-01 DIAGNOSIS — F419 Anxiety disorder, unspecified: Secondary | ICD-10-CM

## 2018-09-01 DIAGNOSIS — F329 Major depressive disorder, single episode, unspecified: Secondary | ICD-10-CM

## 2018-09-01 DIAGNOSIS — F32A Depression, unspecified: Secondary | ICD-10-CM

## 2018-09-01 MED ORDER — BUPROPION HCL ER (XL) 150 MG PO TB24: 150.0000 mg | ORAL_TABLET | Freq: Every day | ORAL | 11 refills | Status: DC

## 2018-09-01 NOTE — Progress Notes (Signed)
Obstetrics & Gynecology Office Visit   Chief Complaint:  Chief Complaint  Patient presents with  . Follow-up    History of Present Illness: The patient is a 32 y.o. female presenting follow up for symptoms of anxiety and depression.  The patient is currently taking Wellbutrin XL 150mg  for the management of her symptoms.  She has not had any recent situational stressors.  She reports symptoms resolved and she noted improvement in energy as opposed to the lexapro.  She denies anhedonia, day time somnolence, insomnia, risk taking behavior, irritability, increased appetite, decreased appetite, social anxiety, agorophobia, feelings of guilt, feelings of worthlessness, suicidal ideation, homicidal ideation, auditory hallucinations and visual hallucinations. Symptoms have improved since last visit.     The patient does not have a pre-existing history of depression and anxiety.    Review of Systems: Review of Systems  Constitutional: Negative.   Gastrointestinal: Negative.   Neurological: Negative for dizziness and headaches.  Psychiatric/Behavioral: Negative.      Past Medical History:  Past Medical History:  Diagnosis Date  . Allergy   . GERD (gastroesophageal reflux disease)   . Gestational diabetes   . History of chicken pox   . Hypertension   . Subglottic stenosis   . Tachycardia     Past Surgical History:  Past Surgical History:  Procedure Laterality Date  . CESAREAN SECTION  11/26/11  . CESAREAN SECTION N/A 04/01/2016   Procedure: CESAREAN SECTION;  Surgeon: Gae Dry, MD;  Location: [facility] ORS;  Service: Obstetrics;  Laterality: N/A;  Time of birth 18:[birth year] Sex: Female Weight: 7 lbs, 13 oz   . CESAREAN SECTION N/A 10/27/2017   Procedure: CESAREAN SECTION;  Surgeon: Malachy Mood, MD;  Location: [facility] ORS;  Service: Obstetrics;  Laterality: N/A;  . KNEE SURGERY Left 2005  . LARYNX SURGERY  2018  . TONSILLECTOMY      Gynecologic History: Patient's last menstrual  period was 08/21/2018.  Obstetric History: [id number]  Family History:  History reviewed. No pertinent family history.  Social History:  Social History   Socioeconomic History  . Marital status: Married    Spouse name: Not on file  . Number of children: Not on file  . Years of education: Not on file  . Highest education level: Not on file  Occupational History  . Occupation: cna    Employer: [employer]  Social Needs  . Financial resource strain: Not on file  . Food insecurity:    Worry: Not on file    Inability: Not on file  . Transportation needs:    Medical: Not on file    Non-medical: Not on file  Tobacco Use  . Smoking status: Never Smoker  . Smokeless tobacco: Never Used  Substance and Sexual Activity  . Alcohol use: No    Alcohol/week: 0.0 standard drinks  . Drug use: No  . Sexual activity: Yes  Lifestyle  . Physical activity:    Days per week: Not on file    Minutes per session: Not on file  . Stress: Not on file  Relationships  . Social connections:    Talks on phone: Not on file    Gets together: Not on file    Attends religious service: Not on file    Active member of club or organization: Not on file    Attends meetings of clubs or organizations: Not on file    Relationship status: Not on file  . Intimate partner violence:    Fear  of current or ex partner: Not on file    Emotionally abused: Not on file    Physically abused: Not on file    Forced sexual activity: Not on file  Other Topics Concern  . Not on file  Social History Narrative   Regular exercise--yes    Allergies:  Allergies  Allergen Reactions  . Sulfonamide Derivatives Hives    Medications: Prior to Admission medications   Medication Sig Start Date End Date Taking? Authorizing Provider  buPROPion (WELLBUTRIN XL) 150 MG 24 hr tablet Take 1 tablet (150 mg total) by mouth daily. 08/01/18  Yes Malachy Mood, MD  Multiple Vitamins-Minerals (HAIR SKIN AND NAILS FORMULA PO) Take by  mouth.   Yes [provider]    Physical Exam Vitals:  Vitals:   09/01/18 1610  BP: 122/82   Patient's last menstrual period was 08/21/2018.  General: NAD HEENT: normocephalic, anicteric Pulmonary: No increased work of breathing Neurologic: Grossly intact Psychiatric: mood appropriate, affect full    GAD 7 : Generalized Anxiety Score 08/01/2018 07/05/2018 02/01/2018  Nervous, Anxious, on Edge 0 3 0  Control/stop worrying 0 3 1  Worry too much - different things 0 3 3  Trouble relaxing 1 1 1   Restless 1 0 0  Easily annoyed or irritable 1 2 0  Afraid - awful might happen 0 0 0  Total GAD 7 Score 3 12 5   Anxiety Difficulty Not difficult at all Somewhat difficult Somewhat difficult    Depression screen [facility] 2/9 08/01/2018 07/05/2018 02/01/2018  Decreased Interest 1 2 0  Down, Depressed, Hopeless 0 2 1  PHQ - 2 Score 1 4 1   Altered sleeping 3 0 1  Tired, decreased energy 3 3 1   Change in appetite 1 1 1   Feeling bad or failure about yourself  1 3 0  Trouble concentrating 0 0 0  Moving slowly or fidgety/restless 1 0 0  Suicidal thoughts 0 1 0  PHQ-9 Score 10 12 4   Difficult doing work/chores Somewhat difficult Somewhat difficult Somewhat difficult    Depression screen [facility] 2/9 08/01/2018 07/05/2018 02/01/2018 10/20/2015  Decreased Interest 1 2 0 0  Down, Depressed, Hopeless 0 2 1 0  PHQ - 2 Score 1 4 1  0  Altered sleeping 3 0 1 -  Tired, decreased energy 3 3 1  -  Change in appetite 1 1 1  -  Feeling bad or failure about yourself  1 3 0 -  Trouble concentrating 0 0 0 -  Moving slowly or fidgety/restless 1 0 0 -  Suicidal thoughts 0 1 0 -  PHQ-9 Score 10 12 4  -  Difficult doing work/chores Somewhat difficult Somewhat difficult Somewhat difficult -     Assessment: 32 y.o. [id number] follow up anxiety and depressioin  Plan: Problem List Items Addressed This Visit    None    Visit Diagnoses    Anxiety and depression    -  Primary   Relevant Medications    buPROPion (WELLBUTRIN XL) 150 MG 24 hr tablet      1) Anxiety and depression - continued good response.  Patient feels current symptoms are acceptably well controlled.  GAD-7 is PHQ-9 is 1 GAD-7 is 3  2) Thyroid and B12 screen has been obtained previously  3) Return in about 4 months (around 01/01/2019) for annual exam.    Malachy Mood, MD, [facility], [facility] 09/01/2018, 4:35 PM

## 2018-11-03 ENCOUNTER — Ambulatory Visit
Admission: RE | Admit: 2018-11-03 | Discharge: 2018-11-03 | Disposition: A | Discharge status: Home / Self Care | Payer: 59 | Source: Ambulatory Visit | Attending: Family Medicine | Admitting: Family Medicine

## 2018-11-03 ENCOUNTER — Other Ambulatory Visit: Payer: Self-pay | Admitting: Family Medicine

## 2018-11-03 ENCOUNTER — Ambulatory Visit: Payer: Self-pay

## 2018-11-03 DIAGNOSIS — H7091 Unspecified mastoiditis, right ear: Secondary | ICD-10-CM | POA: Diagnosis not present

## 2018-11-10 ENCOUNTER — Other Ambulatory Visit: Payer: Self-pay | Admitting: Family Medicine

## 2018-11-10 DIAGNOSIS — H7091 Unspecified mastoiditis, right ear: Secondary | ICD-10-CM

## 2018-12-25 ENCOUNTER — Ambulatory Visit: Payer: 59 | Admitting: Obstetrics and Gynecology

## 2019-01-09 ENCOUNTER — Other Ambulatory Visit: Payer: Self-pay | Admitting: Obstetrics and Gynecology

## 2019-01-09 DIAGNOSIS — N939 Abnormal uterine and vaginal bleeding, unspecified: Secondary | ICD-10-CM

## 2019-01-09 MED ORDER — MEDROXYPROGESTERONE ACETATE 10 MG PO TABS: 20.0000 mg | ORAL_TABLET | Freq: Every day | ORAL | 11 refills | Status: DC

## 2019-01-09 NOTE — Telephone Encounter (Signed)
GYN ultrasound in the next 1-2 weeks

## 2019-01-09 NOTE — Telephone Encounter (Signed)
Patient is schedule 01/17/19

## 2019-01-17 ENCOUNTER — Encounter: Payer: Self-pay | Admitting: Obstetrics and Gynecology

## 2019-01-17 ENCOUNTER — Other Ambulatory Visit: Payer: Self-pay | Admitting: Obstetrics and Gynecology

## 2019-01-17 ENCOUNTER — Telehealth: Payer: Self-pay | Admitting: Obstetrics and Gynecology

## 2019-01-17 ENCOUNTER — Ambulatory Visit (INDEPENDENT_AMBULATORY_CARE_PROVIDER_SITE_OTHER): Payer: 59 | Admitting: Obstetrics and Gynecology

## 2019-01-17 ENCOUNTER — Ambulatory Visit (INDEPENDENT_AMBULATORY_CARE_PROVIDER_SITE_OTHER): Payer: 59

## 2019-01-17 ENCOUNTER — Other Ambulatory Visit: Payer: Self-pay

## 2019-01-17 VITALS — BP 134/82 | HR 111 | Ht 67.0 in | Wt 222.0 lb

## 2019-01-17 DIAGNOSIS — N83202 Unspecified ovarian cyst, left side: Secondary | ICD-10-CM | POA: Diagnosis not present

## 2019-01-17 DIAGNOSIS — E282 Polycystic ovarian syndrome: Secondary | ICD-10-CM

## 2019-01-17 DIAGNOSIS — N939 Abnormal uterine and vaginal bleeding, unspecified: Secondary | ICD-10-CM

## 2019-01-17 NOTE — Telephone Encounter (Signed)
Patient is schedule 02/25/19 for possible Mirena IUD placement at 3 pm

## 2019-01-18 NOTE — Progress Notes (Signed)
Gynecology Ultrasound Follow Up  Chief Complaint:  Chief Complaint  Patient presents with  . Follow-up    GYN Ultrasound     History of Present Illness: Patient is a 33 y.o. female who presents today for ultrasound evaluation of AUB.Marland Kitchen  Ultrasound demonstrates the following findgins Adnexa: no masses seen discrete, string of pearl appearance Uterus: none enlarged with endometrial stripe  55mm Additional: no free fluid  The patient reports cessation of bleeding upon starting provera 20mg  po daily. She has successfully titrated down to 10mg  po daily with continued good cycle suppression.  Does report 20lbs weight gain over the past year.     Review of Systems: Review of Systems  Constitutional: Negative.   Gastrointestinal: Negative.   Genitourinary: Negative.     Past Medical History:  Past Medical History:  Diagnosis Date  . Allergy   . GERD (gastroesophageal reflux disease)   . Gestational diabetes   . History of chicken pox   . Hypertension   . Subglottic stenosis   . Tachycardia     Past Surgical History:  Past Surgical History:  Procedure Laterality Date  . CESAREAN SECTION  11/26/11  . CESAREAN SECTION N/A 04/01/2016   Procedure: CESAREAN SECTION;  Surgeon: Gae Dry, MD;  Location: [facility] ORS;  Service: Obstetrics;  Laterality: N/A;  Time of birth 18:50 Sex: Female Weight: 7 lbs, 13 oz   . CESAREAN SECTION N/A 10/27/2017   Procedure: CESAREAN SECTION;  Surgeon: Malachy Mood, MD;  Location: [facility] ORS;  Service: Obstetrics;  Laterality: N/A;  . KNEE SURGERY Left 2005  . LARYNX SURGERY  2018  . TONSILLECTOMY      Family History:  History reviewed. No pertinent family history.  Social History:  Social History   Socioeconomic History  . Marital status: Married    Spouse name: Not on file  . Number of children: Not on file  . Years of education: Not on file  . Highest education level: Not on file  Occupational History  . Occupation: cna   Employer: [employer]  Social Needs  . Financial resource strain: Not on file  . Food insecurity:    Worry: Not on file    Inability: Not on file  . Transportation needs:    Medical: Not on file    Non-medical: Not on file  Tobacco Use  . Smoking status: Never Smoker  . Smokeless tobacco: Never Used  Substance and Sexual Activity  . Alcohol use: No    Alcohol/week: 0.0 standard drinks  . Drug use: No  . Sexual activity: Yes  Lifestyle  . Physical activity:    Days per week: Not on file    Minutes per session: Not on file  . Stress: Not on file  Relationships  . Social connections:    Talks on phone: Not on file    Gets together: Not on file    Attends religious service: Not on file    Active member of club or organization: Not on file    Attends meetings of clubs or organizations: Not on file    Relationship status: Not on file  . Intimate partner violence:    Fear of current or ex partner: Not on file    Emotionally abused: Not on file    Physically abused: Not on file    Forced sexual activity: Not on file  Other Topics Concern  . Not on file  Social History Narrative   Regular exercise--yes  Allergies:  Allergies  Allergen Reactions  . Sulfonamide Derivatives Hives    Medications: Prior to Admission medications   Medication Sig Start Date End Date Taking? Authorizing Provider  buPROPion (WELLBUTRIN XL) 150 MG 24 hr tablet Take 1 tablet (150 mg total) by mouth daily. 09/01/18  Yes Malachy Mood, MD  medroxyPROGESTERone (PROVERA) 10 MG tablet Take 2 tablets (20 mg total) by mouth daily for 30 days. 01/09/19 02/08/19 Yes Malachy Mood, MD  Multiple Vitamins-Minerals (HAIR SKIN AND NAILS FORMULA PO) Take by mouth.   Yes [provider]    Physical Exam Vitals: Blood pressure 134/82, pulse (!) 111, height 5\' 7"  (1.702 m), weight 222 lb (100.7 kg), last menstrual period 01/07/2019, currently breastfeeding.  General: NAD HEENT: normocephalic,  anicteric Pulmonary: No increased work of breathing Extremities: no edema, erythema, or tenderness Neurologic: Grossly intact, normal gait Psychiatric: mood appropriate, affect full   Assessment: 33 y.o. [id number] No problem-specific Assessment & Plan notes found for this encounter.   Plan: Problem List Items Addressed This Visit    None    Visit Diagnoses    Abnormal uterine bleeding    -  Primary   Relevant Orders   TSH+Prl+FSH+TestT+LH+DHEA S...   CBC   PCOS (polycystic ovarian syndrome)       Relevant Orders   TSH+Prl+FSH+TestT+LH+DHEA S...   Hemoglobin A1c   CBC      1) AUB-O - suspect underlying PCOS.  Will obtain labs to see if able to meet 2/3 [place] criteria.  Discussed long term management in patient not trying to conceive is some sort of contraceptive hormone therapy.  As she did well with provera thus far encourage to consider IUD. We also discussed that PCOS likely is multifactorial with both genetic and environmental factors playing a role.  Weight loss has been associated with regulation of menstrual cycle in patient with PCOS.  She is not a great candidate for phentermine given her history of SVT.   - check CBC given how heavy bleeding was - PCOS panel - HgbA1C  2) A total of 15 minutes were spent in face-to-face contact with the patient during this encounter with over half of that time devoted to counseling and coordination of care.  3) Return in about 1 week (around 01/24/2019) for Follow up 1 week to go over labs, possible Mirena IUD placement.    Malachy Mood, MD, [facility], [facility] 01/18/2019, 9:30 AM

## 2019-01-18 NOTE — Telephone Encounter (Signed)
Mirena reserved for this patients appointment 01/25/2019

## 2019-01-20 LAB — TSH+PRL+FSH+TESTT+LH+DHEA S...
17-Hydroxyprogesterone: 45 ng/dL
Androstenedione: 101 ng/dL (ref 41–262)
DHEA-SO4: 85 ug/dL (ref 84.8–378.0)
FSH: 4.8 m[IU]/mL
LH: 6.3 m[IU]/mL
Prolactin: 10.4 ng/mL (ref 4.8–23.3)
TSH: 0.804 u[IU]/mL (ref 0.450–4.500)
Testosterone, Free: 1.5 pg/mL (ref 0.0–4.2)
Testosterone: 4 ng/dL — ABNORMAL LOW (ref 8–48)

## 2019-01-20 LAB — CBC
Hematocrit: 42.5 % (ref 34.0–46.6)
Hemoglobin: 13.8 g/dL (ref 11.1–15.9)
MCH: 26 pg — ABNORMAL LOW (ref 26.6–33.0)
MCHC: 32.5 g/dL (ref 31.5–35.7)
MCV: 80 fL (ref 79–97)
Platelets: 244 10*3/uL (ref 150–450)
RBC: 5.31 x10E6/uL — ABNORMAL HIGH (ref 3.77–5.28)
RDW: 13.1 % (ref 11.7–15.4)
WBC: 6.2 10*3/uL (ref 3.4–10.8)

## 2019-01-20 LAB — HEMOGLOBIN A1C
Est. average glucose Bld gHb Est-mCnc: 105 mg/dL
Hgb A1c MFr Bld: 5.3 % (ref 4.8–5.6)

## 2019-01-25 ENCOUNTER — Ambulatory Visit: Payer: 59 | Admitting: Obstetrics and Gynecology

## 2019-01-25 ENCOUNTER — Ambulatory Visit (INDEPENDENT_AMBULATORY_CARE_PROVIDER_SITE_OTHER): Payer: 59 | Admitting: Obstetrics and Gynecology

## 2019-01-25 ENCOUNTER — Encounter: Payer: Self-pay | Admitting: Obstetrics and Gynecology

## 2019-01-25 ENCOUNTER — Other Ambulatory Visit: Payer: Self-pay

## 2019-01-25 VITALS — BP 122/72 | HR 115 | Wt 217.0 lb

## 2019-01-25 DIAGNOSIS — Z30430 Encounter for insertion of intrauterine contraceptive device: Secondary | ICD-10-CM

## 2019-01-25 NOTE — Progress Notes (Signed)
   GYNECOLOGY OFFICE PROCEDURE NOTE  Mallory Pearson is a 33 y.o. [phone] here for a Mirena IUD insertion. No GYN concerns.  Last pap smear was on 04/11/2018 and was normal.  The patient is currently using provera for contraception and her LMP is Patient's last menstrual period was 01/07/2019.Marland Kitchen  The indication for her IUD is cycle control.  IUD Insertion Procedure Note Patient identified, informed consent performed, consent signed.   Discussed risks of irregular bleeding, cramping, infection, malpositioning, expulsion or uterine perforation of the IUD (1:1000 placements)  which may require further procedure such as laparoscopy.  IUD while effective at preventing pregnancy do not prevent transmission of sexually transmitted diseases and use of barrier methods for this purpose was discussed. Time out was performed.  Urine pregnancy test negative.  Speculum placed in the vagina.  Cervix visualized.  Cleaned with Betadine x 2.  Grasped anteriorly with a single tooth tenaculum.  Uterus sounded to 8 cm. IUD placed per manufacturer's recommendations.  Strings trimmed to 3 cm. Tenaculum was removed, good hemostasis noted.  Patient tolerated procedure well.   Patient was given post-procedure instructions.  She was advised to have backup contraception for one week.  Patient was also asked to check IUD strings periodically and follow up in 6 weeks for IUD check.  Malachy Mood, MD, [facility], [place]

## 2019-02-20 ENCOUNTER — Ambulatory Visit: Payer: Self-pay | Admitting: Obstetrics and Gynecology

## 2019-02-27 ENCOUNTER — Encounter: Payer: Self-pay | Admitting: Obstetrics and Gynecology

## 2019-02-27 ENCOUNTER — Ambulatory Visit (INDEPENDENT_AMBULATORY_CARE_PROVIDER_SITE_OTHER): Payer: 59 | Admitting: Obstetrics and Gynecology

## 2019-02-27 ENCOUNTER — Other Ambulatory Visit: Payer: Self-pay

## 2019-02-27 VITALS — BP 120/80 | Ht 67.0 in | Wt 229.4 lb

## 2019-02-27 DIAGNOSIS — E669 Obesity, unspecified: Secondary | ICD-10-CM

## 2019-02-27 DIAGNOSIS — Z6835 Body mass index (BMI) 35.0-35.9, adult: Secondary | ICD-10-CM

## 2019-02-27 DIAGNOSIS — Z01419 Encounter for gynecological examination (general) (routine) without abnormal findings: Secondary | ICD-10-CM

## 2019-02-27 DIAGNOSIS — Z30431 Encounter for routine checking of intrauterine contraceptive device: Secondary | ICD-10-CM

## 2019-02-27 MED ORDER — PHENTERMINE HCL 37.5 MG PO TABS: 37.5000 mg | ORAL_TABLET | Freq: Every day | ORAL | 0 refills | Status: DC

## 2019-02-27 MED ORDER — BUPROPION HCL ER (XL) 150 MG PO TB24: 150.0000 mg | ORAL_TABLET | Freq: Every day | ORAL | 11 refills | Status: DC

## 2019-02-27 NOTE — Progress Notes (Signed)
Gynecology Annual Exam   PCP: Hortencia Pilar, MD  Chief Complaint:  Chief Complaint  Patient presents with  . Gynecologic Exam    no concerns    History of Present Illness: Patient is a 33 y.o. [id number] presents for annual exam. The patient has no complaints today.   LMP: No LMP recorded. (Menstrual status: IUD). Amenorrhea on MIrena IUD  She currently uses IUD for contraception. She denies dyspareunia.  The patient does perform self breast exams.  There is no notable family history of breast or ovarian cancer in her family.  The patient wears seatbelts: yes.   The patient has regular exercise: not asked.    The patient denies current symptoms of depression.     Patientis a 33 y.o. [id number] female, who presents for the evaluation of weight gain. She has gained 11 pounds primarily over 6 months. The patient states the following issues have contributed to her weight problem: decreased physical activity with gym closed, and stress eating.  The patient has no additional symptoms. The patient specifically denies memory loss, muscle weakness, excessive thirst, and polyuria. Weight related co-morbidities include none. She has tried phentermine interventions in the past with good success.   Review of Systems: Review of Systems  Constitutional: Negative for chills and fever.  HENT: Negative for congestion.   Respiratory: Negative for cough and shortness of breath.   Cardiovascular: Negative for chest pain and palpitations.  Gastrointestinal: Negative for abdominal pain, constipation, diarrhea, heartburn, nausea and vomiting.  Genitourinary: Negative for dysuria, frequency and urgency.  Skin: Negative for itching and rash.  Neurological: Negative for dizziness and headaches.  Endo/Heme/Allergies: Negative for polydipsia.  Psychiatric/Behavioral: Negative for depression.    Past Medical History:  Past Medical History:  Diagnosis Date  . Allergy   . GERD (gastroesophageal reflux  disease)   . Gestational diabetes   . History of chicken pox   . Hypertension   . Subglottic stenosis   . Tachycardia     Past Surgical History:  Past Surgical History:  Procedure Laterality Date  . CESAREAN SECTION  11/26/11  . CESAREAN SECTION N/A 04/01/2016   Procedure: CESAREAN SECTION;  Surgeon: Gae Dry, MD;  Location: [facility] ORS;  Service: Obstetrics;  Laterality: N/A;  Time of birth 18:50 Sex: Female Weight: 7 lbs, 13 oz   . CESAREAN SECTION N/A 10/27/2017   Procedure: CESAREAN SECTION;  Surgeon: Malachy Mood, MD;  Location: [facility] ORS;  Service: Obstetrics;  Laterality: N/A;  . KNEE SURGERY Left 2005  . LARYNX SURGERY  2018  . TONSILLECTOMY      Gynecologic History:  No LMP recorded. (Menstrual status: IUD).  Obstetric History: [id number]  Family History:  History reviewed. No pertinent family history.  Social History:  Social History   Socioeconomic History  . Marital status: Married    Spouse name: Not on file  . Number of children: Not on file  . Years of education: Not on file  . Highest education level: Not on file  Occupational History  . Occupation: cna    Employer: [employer]  Social Needs  . Financial resource strain: Not on file  . Food insecurity    Worry: Not on file    Inability: Not on file  . Transportation needs    Medical: Not on file    Non-medical: Not on file  Tobacco Use  . Smoking status: Never Smoker  . Smokeless tobacco: Never Used  Substance and Sexual Activity  . Alcohol  use: No    Alcohol/week: 0.0 standard drinks  . Drug use: No  . Sexual activity: Yes    Birth control/protection: I.U.D.  Lifestyle  . Physical activity    Days per week: Not on file    Minutes per session: Not on file  . Stress: Not on file  Relationships  . Social Herbalist on phone: Not on file    Gets together: Not on file    Attends religious service: Not on file    Active member of club or organization: Not on file    Attends  meetings of clubs or organizations: Not on file    Relationship status: Not on file  . Intimate partner violence    Fear of current or ex partner: Not on file    Emotionally abused: Not on file    Physically abused: Not on file    Forced sexual activity: Not on file  Other Topics Concern  . Not on file  Social History Narrative   Regular exercise--yes    Allergies:  Allergies  Allergen Reactions  . Sulfa Antibiotics Hives  . Sulfasalazine Hives  . Sulfonamide Derivatives Hives    Medications: Prior to Admission medications   Medication Sig Start Date End Date Taking? Authorizing Provider  buPROPion (WELLBUTRIN XL) 150 MG 24 hr tablet Take 1 tablet (150 mg total) by mouth daily. 09/01/18  Yes Malachy Mood, MD  Multiple Vitamins-Minerals (HAIR SKIN AND NAILS FORMULA PO) Take by mouth.   Yes [provider]    Physical Exam Vitals: Blood pressure 120/80, height 5\' 7"  (1.702 m), weight 229 lb 6.4 oz (104.1 kg), not currently breastfeeding. Body mass index is 35.93 kg/m.   General: NAD HEENT: normocephalic, anicteric Thyroid: no enlargement, no palpable nodules Pulmonary: No increased work of breathing, CTAB Cardiovascular: RRR, distal pulses 2+ Breast: Breast symmetrical, no tenderness, no palpable nodules or masses, no skin or nipple retraction present, no nipple discharge.  No axillary or supraclavicular lymphadenopathy. Abdomen: NABS, soft, non-tender, non-distended.  Umbilicus without lesions.  No hepatomegaly, splenomegaly or masses palpable. No evidence of hernia  Genitourinary:  External: Normal external female genitalia.  Normal urethral meatus, normal Bartholin's and Skene's glands.    Vagina: Normal vaginal mucosa, no evidence of prolapse.    Cervix: Grossly normal in appearance, no bleeding, IUD strings 3cm  Uterus: Non-enlarged, mobile, normal contour.  No CMT  Adnexa: ovaries non-enlarged, no adnexal masses  Rectal: deferred  Lymphatic: no  evidence of inguinal lymphadenopathy Extremities: no edema, erythema, or tenderness Neurologic: Grossly intact Psychiatric: mood appropriate, affect full  Female chaperone present for pelvic and breast  portions of the physical exam    Assessment: 33 y.o. [id number] routine annual exam  Plan: Problem List Items Addressed This Visit    None      1) STI screening  was notoffered and therefore not obtained  2)  ASCCP guidelines and rational discussed.  Patient opts for every 3 years screening interval  3) Contraception - the patient is currently using  IUD.  She is happy with her current form of contraception and plans to continue  4) Routine healthcare maintenance including cholesterol, diabetes screening discussed managed by PCP  5) Return in about 4 weeks (around 03/27/2019) for medication follow up.    1) 1500 Calorie ADA Diet  2) Patient education given regarding appropriate lifestyle changes for weight loss including: regular physical activity, healthy coping strategies, caloric restriction and healthy eating patterns.  3) Patient  will be started on weight loss medication. The risks and benefits and side effects of medication, such as Adipex (Phenteramine) ,  Tenuate (Diethylproprion), Belviq (lorcarsin), Contrave (buproprion/naltrexone), Qsymia (phentermine/topiramate), and Saxenda (liraglutide) is discussed. The pros and cons of suppressing appetite and boosting metabolism is discussed. Risks of tolerence and addiction is discussed for selected agents discussed. Use of medicine will ne short term, such as 3-4 months at a time followed by a period of time off of the medicine to avoid these risks and side effects for Adipex, Qsymia, and Tenuate discussed. Pt to call with any negative side effects and agrees to keep follow up appts.  4) Comorbidity Screening - hypothyroidism screening, diabetes, and hyperlipidemia screening offered  5) Encouraged weekly weight monitorig to track  progress and sample 1 week food diary  6) Contraception - discussed that all weight loss drugs fall in to pregnancy category X, patient currently has reliable contraception in the form of IUD  7) 15 minutes face-to-face; counseling/coordination of care > 50 percent of visit  8) Follow up in 4 weeks to assess response     Malachy Mood, MD, [place], [facility] 02/27/2019, 4:13 PM

## 2019-03-06 ENCOUNTER — Ambulatory Visit: Payer: 59 | Admitting: Obstetrics and Gynecology

## 2019-03-27 ENCOUNTER — Other Ambulatory Visit: Payer: Self-pay

## 2019-03-27 ENCOUNTER — Ambulatory Visit (INDEPENDENT_AMBULATORY_CARE_PROVIDER_SITE_OTHER): Payer: 59 | Admitting: Obstetrics and Gynecology

## 2019-03-27 ENCOUNTER — Encounter: Payer: Self-pay | Admitting: Obstetrics and Gynecology

## 2019-03-27 VITALS — BP 124/90 | HR 106 | Ht 67.0 in | Wt 213.0 lb

## 2019-03-27 DIAGNOSIS — E669 Obesity, unspecified: Secondary | ICD-10-CM | POA: Diagnosis not present

## 2019-03-27 DIAGNOSIS — Z6833 Body mass index (BMI) 33.0-33.9, adult: Secondary | ICD-10-CM | POA: Diagnosis not present

## 2019-03-27 MED ORDER — PHENTERMINE HCL 37.5 MG PO TABS: 37.5000 mg | ORAL_TABLET | Freq: Every day | ORAL | 0 refills | Status: DC

## 2019-03-27 NOTE — Progress Notes (Signed)
Gynecology Office Visit  Chief Complaint:  Chief Complaint  Patient presents with  . Weight Check    History of Present Illness: Patientis a 33 y.o. [id number] female, who presents for the evaluation of the desire to lose weight. She has lost 16 pounds 1 months. The patient states the following symptoms since starting her weight loss therapy: appetite suppression, energy, and weight loss.  The patient also reports no other ill effects. The patient specifically denies heart palpitations, anxiety, and insomnia.    Review of Systems: 10 point review of systems negative unless otherwise noted in HPI  Past Medical History:  Past Medical History:  Diagnosis Date  . Allergy   . GERD (gastroesophageal reflux disease)   . Gestational diabetes   . History of chicken pox   . Hypertension   . Subglottic stenosis   . Tachycardia     Past Surgical History:  Past Surgical History:  Procedure Laterality Date  . CESAREAN SECTION  11/26/11  . CESAREAN SECTION N/A 04/01/2016   Procedure: CESAREAN SECTION;  Surgeon: Gae Dry, MD;  Location: [facility] ORS;  Service: Obstetrics;  Laterality: N/A;  Time of birth 18:50 Sex: Female Weight: 7 lbs, 13 oz   . CESAREAN SECTION N/A 10/27/2017   Procedure: CESAREAN SECTION;  Surgeon: Malachy Mood, MD;  Location: [facility] ORS;  Service: Obstetrics;  Laterality: N/A;  . KNEE SURGERY Left 2005  . LARYNX SURGERY  2018  . TONSILLECTOMY      Gynecologic History: No LMP recorded. (Menstrual status: IUD).  Obstetric History: [id number]  Family History:  History reviewed. No pertinent family history.  Social History:  Social History   Socioeconomic History  . Marital status: Married    Spouse name: Not on file  . Number of children: Not on file  . Years of education: Not on file  . Highest education level: Not on file  Occupational History  . Occupation: cna    Employer: [employer]  Social Needs  . Financial resource strain: Not on file  . Food  insecurity    Worry: Not on file    Inability: Not on file  . Transportation needs    Medical: Not on file    Non-medical: Not on file  Tobacco Use  . Smoking status: Never Smoker  . Smokeless tobacco: Never Used  Substance and Sexual Activity  . Alcohol use: No    Alcohol/week: 0.0 standard drinks  . Drug use: No  . Sexual activity: Yes    Birth control/protection: I.U.D.  Lifestyle  . Physical activity    Days per week: Not on file    Minutes per session: Not on file  . Stress: Not on file  Relationships  . Social Herbalist on phone: Not on file    Gets together: Not on file    Attends religious service: Not on file    Active member of club or organization: Not on file    Attends meetings of clubs or organizations: Not on file    Relationship status: Not on file  . Intimate partner violence    Fear of current or ex partner: Not on file    Emotionally abused: Not on file    Physically abused: Not on file    Forced sexual activity: Not on file  Other Topics Concern  . Not on file  Social History Narrative   Regular exercise--yes    Allergies:  Allergies  Allergen Reactions  . Sulfa  Antibiotics Hives  . Sulfasalazine Hives  . Sulfonamide Derivatives Hives    Medications: Prior to Admission medications   Medication Sig Start Date End Date Taking? Authorizing Provider  buPROPion (WELLBUTRIN XL) 150 MG 24 hr tablet Take 1 tablet (150 mg total) by mouth daily. 02/27/19  Yes Malachy Mood, MD  Multiple Vitamins-Minerals (HAIR SKIN AND NAILS FORMULA PO) Take by mouth.   Yes [provider]  phentermine (ADIPEX-P) 37.5 MG tablet Take 1 tablet (37.5 mg total) by mouth daily before breakfast. 02/27/19  Yes Malachy Mood, MD    Physical Exam Blood pressure 124/90, pulse (!) 106, height 5\' 7"  (1.702 m), weight 213 lb (96.6 kg), not currently breastfeeding. Wt Readings from Last 3 Encounters:  03/27/19 213 lb (96.6 kg)  02/27/19 229 lb 6.4 oz  (104.1 kg)  01/25/19 217 lb (98.4 kg)  Body mass index is 33.36 kg/m.   General: NAD HEENT: normocephalic, anicteric Thyroid: no enlargement Pulmonary: no increased work of breathing Neurologic: Grossly intact Psychiatric: mood appropriate, affect full  Assessment: 33 y.o. [id number] No problem-specific Assessment & Plan notes found for this encounter.   Plan: Problem List Items Addressed This Visit    None      1) 1500 Calorie ADA Diet  2) Patient education given regarding appropriate lifestyle changes for weight loss including: regular physical activity, healthy coping strategies, caloric restriction and healthy eating patterns.  3) Patient will be started on weight loss medication. The risks and benefits and side effects of medication, such as Adipex (Phenteramine) ,  Tenuate (Diethylproprion), Belviq (lorcarsin), Contrave (buproprion/naltrexone), Qsymia (phentermine/topiramate), and Saxenda (liraglutide) is discussed. The pros and cons of suppressing appetite and boosting metabolism is discussed. Risks of tolerence and addiction is discussed for selected agents discussed. Use of medicine will ne short term, such as 3-4 months at a time followed by a period of time off of the medicine to avoid these risks and side effects for Adipex, Qsymia, and Tenuate discussed. Pt to call with any negative side effects and agrees to keep follow up appts.  4) Patient to take medication, with the benefits of appetite suppression and metabolism boost d/w pt, along with the side effects and risk factors of long term use that will be avoided with our use of short bursts of therapy. Rx provided.    5) 15 minutes face-to-face; with counseling/coordination of care > 50 percent of visit related to obesity and ongoing management/treatment   6)    Malachy Mood, MD, [place], [facility] 03/27/2019, 10:47 AM

## 2019-03-31 ENCOUNTER — Other Ambulatory Visit: Payer: Self-pay | Admitting: Obstetrics and Gynecology

## 2019-03-31 MED ORDER — BUPROPION HCL ER (XL) 150 MG PO TB24: 150.0000 mg | ORAL_TABLET | Freq: Every day | ORAL | 11 refills | Status: DC

## 2019-03-31 MED ORDER — PHENTERMINE HCL 37.5 MG PO TABS: 37.5000 mg | ORAL_TABLET | Freq: Every day | ORAL | 0 refills | Status: DC

## 2019-04-27 ENCOUNTER — Ambulatory Visit: Payer: 59 | Admitting: Obstetrics and Gynecology

## 2019-05-02 ENCOUNTER — Other Ambulatory Visit: Payer: Self-pay

## 2019-05-02 ENCOUNTER — Ambulatory Visit (INDEPENDENT_AMBULATORY_CARE_PROVIDER_SITE_OTHER): Payer: Self-pay | Admitting: Obstetrics and Gynecology

## 2019-05-02 VITALS — Wt 207.0 lb

## 2019-05-02 DIAGNOSIS — Z6832 Body mass index (BMI) 32.0-32.9, adult: Secondary | ICD-10-CM

## 2019-05-02 DIAGNOSIS — E669 Obesity, unspecified: Secondary | ICD-10-CM

## 2019-05-02 MED ORDER — PHENTERMINE HCL 37.5 MG PO TABS: 37.5000 mg | ORAL_TABLET | Freq: Every day | ORAL | 0 refills | Status: DC

## 2019-05-02 NOTE — Progress Notes (Signed)
I connected with Mallory Pearson on 05/02/19 at  8:10 AM EDT by telephone and verified that I am speaking with the correct person using two identifiers.   I discussed the limitations, risks, security and privacy concerns of performing an evaluation and management service by telephone and the availability of in person appointments. I also discussed with the patient that there may be a patient responsible charge related to this service. The patient expressed understanding and agreed to proceed.  The patient was at home I spoke with the patient from my workstation phone The names of people involved in this encounter were: Seymour Bars , and Malachy Mood  Gynecology Office Visit  Chief Complaint: No chief complaint on file.   History of Present Illness: Patientis a 33 y.o. [id number] female, who presents for the evaluation of the desire to lose weight. She has lost 7 pounds 1 months, 2 month total for 23lbs. The patient states the following symptoms since starting her weight loss therapy: appetite suppression, energy, and weight loss.  The patient also reports no other ill effects. The patient specifically denies heart palpitations, anxiety, and insomnia.    Review of Systems: 10 point review of systems negative unless otherwise noted in HPI  Past Medical History:  Past Medical History:  Diagnosis Date  . Allergy   . GERD (gastroesophageal reflux disease)   . Gestational diabetes   . History of chicken pox   . Hypertension   . Subglottic stenosis   . Tachycardia     Past Surgical History:  Past Surgical History:  Procedure Laterality Date  . CESAREAN SECTION  11/26/11  . CESAREAN SECTION N/A 04/01/2016   Procedure: CESAREAN SECTION;  Surgeon: Gae Dry, MD;  Location: [facility] ORS;  Service: Obstetrics;  Laterality: N/A;  Time of birth 18:50 Sex: Female Weight: 7 lbs, 13 oz   . CESAREAN SECTION N/A 10/27/2017   Procedure: CESAREAN SECTION;  Surgeon: Malachy Mood, MD;   Location: [facility] OR[facility];  Service: Obstetrics;  Laterality: N/A;  . KNEE SURGERY Left 2005  . LARYNX SURGERY  2018  . TONSILLECTOMY      Gynecologic History: No LMP recorded. (Menstrual status: IUD).  Obstetric History: [id number]  Family History:  No family history on file.  Social History:  Social History   Socioeconomic History  . Marital status: Married    Spouse name: Not on file  . Number of children: Not on file  . Years of education: Not on file  . Highest education level: Not on file  Occupational History  . Occupation: cna    Employer: [employer]  Social Needs  . Financial resource strain: Not on file  . Food insecurity    Worry: Not on file    Inability: Not on file  . Transportation needs    Medical: Not on file    Non-medical: Not on file  Tobacco Use  . Smoking status: Never Smoker  . Smokeless tobacco: Never Used  Substance and Sexual Activity  . Alcohol use: No    Alcohol/week: 0.0 standard drinks  . Drug use: No  . Sexual activity: Yes    Birth control/protection: I.U.D.  Lifestyle  . Physical activity    Days per week: Not on file    Minutes per session: Not on file  . Stress: Not on file  Relationships  . Social Herbalist on phone: Not on file    Gets together: Not on file  Attends religious service: Not on file    Active member of club or organization: Not on file    Attends meetings of clubs or organizations: Not on file    Relationship status: Not on file  . Intimate partner violence    Fear of current or ex partner: Not on file    Emotionally abused: Not on file    Physically abused: Not on file    Forced sexual activity: Not on file  Other Topics Concern  . Not on file  Social History Narrative   Regular exercise--yes    Allergies:  Allergies  Allergen Reactions  . Sulfa Antibiotics Hives  . Sulfasalazine Hives  . Sulfonamide Derivatives Hives    Medications: Prior to Admission medications   Medication Sig Start  Date End Date Taking? Authorizing Provider  buPROPion (WELLBUTRIN XL) 150 MG 24 hr tablet Take 1 tablet (150 mg total) by mouth daily. 03/31/19   Malachy Mood, MD  Multiple Vitamins-Minerals (HAIR SKIN AND NAILS FORMULA PO) Take by mouth.    [provider]  phentermine (ADIPEX-P) 37.5 MG tablet Take 1 tablet (37.5 mg total) by mouth daily before breakfast. 05/02/19   Malachy Mood, MD    Physical Exam not currently breastfeeding. Wt Readings from Last 3 Encounters:  05/02/19 207 lb (93.9 kg)  03/27/19 213 lb (96.6 kg)  02/27/19 229 lb 6.4 oz (104.1 kg)  207Lbs Body mass index is 32.42 kg/m.  No physical exam as this was a remote telephone visit to promote social distancing during the current COVID-19 Pandemic   Assessment: 33 y.o. [id number] medical weight loss follow up  Plan: Problem List Items Addressed This Visit    None    Visit Diagnoses    Class 1 obesity without serious comorbidity with body mass index (BMI) of 32.0 to 32.9 in adult, unspecified obesity type    -  Primary   Relevant Medications   phentermine (ADIPEX-P) 37.5 MG tablet      1) 1500 Calorie ADA Diet  2) Patient education given regarding appropriate lifestyle changes for weight loss including: regular physical activity, healthy coping strategies, caloric restriction and healthy eating patterns.  3) Telephone time 5 minutes   4) No follow-ups on file.     Malachy Mood, MD, [facility], [facility] 05/02/2019, 8:28 AM

## 2019-06-01 ENCOUNTER — Ambulatory Visit: Payer: Medicaid Other | Admitting: Obstetrics and Gynecology

## 2019-09-18 DIAGNOSIS — J386 Stenosis of larynx: Secondary | ICD-10-CM | POA: Diagnosis not present

## 2019-10-15 DIAGNOSIS — R000 Tachycardia, unspecified: Secondary | ICD-10-CM | POA: Diagnosis not present

## 2019-10-15 DIAGNOSIS — I10 Essential (primary) hypertension: Secondary | ICD-10-CM | POA: Diagnosis not present

## 2019-10-15 DIAGNOSIS — R002 Palpitations: Secondary | ICD-10-CM | POA: Diagnosis not present

## 2019-10-15 DIAGNOSIS — I471 Supraventricular tachycardia: Secondary | ICD-10-CM | POA: Diagnosis not present

## 2019-10-31 DIAGNOSIS — R002 Palpitations: Secondary | ICD-10-CM | POA: Diagnosis not present

## 2019-10-31 DIAGNOSIS — I10 Essential (primary) hypertension: Secondary | ICD-10-CM | POA: Diagnosis not present

## 2019-10-31 DIAGNOSIS — R000 Tachycardia, unspecified: Secondary | ICD-10-CM | POA: Diagnosis not present

## 2019-10-31 DIAGNOSIS — I471 Supraventricular tachycardia: Secondary | ICD-10-CM | POA: Diagnosis not present

## 2019-11-02 DIAGNOSIS — Z1159 Encounter for screening for other viral diseases: Secondary | ICD-10-CM | POA: Diagnosis not present

## 2019-11-02 DIAGNOSIS — R739 Hyperglycemia, unspecified: Secondary | ICD-10-CM | POA: Diagnosis not present

## 2019-11-02 DIAGNOSIS — E782 Mixed hyperlipidemia: Secondary | ICD-10-CM | POA: Diagnosis not present

## 2019-11-02 DIAGNOSIS — I10 Essential (primary) hypertension: Secondary | ICD-10-CM | POA: Diagnosis not present

## 2019-11-02 DIAGNOSIS — Z0000 Encounter for general adult medical examination without abnormal findings: Secondary | ICD-10-CM | POA: Diagnosis not present

## 2019-12-30 DIAGNOSIS — M5441 Lumbago with sciatica, right side: Secondary | ICD-10-CM | POA: Diagnosis not present

## 2020-01-11 DIAGNOSIS — J387 Other diseases of larynx: Secondary | ICD-10-CM | POA: Diagnosis not present

## 2020-01-11 DIAGNOSIS — F325 Major depressive disorder, single episode, in full remission: Secondary | ICD-10-CM | POA: Diagnosis not present

## 2020-01-24 DIAGNOSIS — J386 Stenosis of larynx: Secondary | ICD-10-CM | POA: Diagnosis not present

## 2020-01-24 DIAGNOSIS — J42 Unspecified chronic bronchitis: Secondary | ICD-10-CM | POA: Diagnosis not present

## 2020-01-29 DIAGNOSIS — J42 Unspecified chronic bronchitis: Secondary | ICD-10-CM | POA: Diagnosis not present

## 2020-01-29 DIAGNOSIS — J386 Stenosis of larynx: Secondary | ICD-10-CM | POA: Diagnosis not present

## 2020-02-11 DIAGNOSIS — I471 Supraventricular tachycardia: Secondary | ICD-10-CM | POA: Diagnosis not present

## 2020-02-11 DIAGNOSIS — K219 Gastro-esophageal reflux disease without esophagitis: Secondary | ICD-10-CM | POA: Diagnosis not present

## 2020-02-11 DIAGNOSIS — J386 Stenosis of larynx: Secondary | ICD-10-CM | POA: Diagnosis not present

## 2020-02-11 DIAGNOSIS — E669 Obesity, unspecified: Secondary | ICD-10-CM | POA: Diagnosis not present

## 2020-02-11 DIAGNOSIS — Z882 Allergy status to sulfonamides status: Secondary | ICD-10-CM | POA: Diagnosis not present

## 2020-02-11 DIAGNOSIS — F329 Major depressive disorder, single episode, unspecified: Secondary | ICD-10-CM | POA: Diagnosis not present

## 2020-02-11 DIAGNOSIS — Z6841 Body Mass Index (BMI) 40.0 and over, adult: Secondary | ICD-10-CM | POA: Diagnosis not present

## 2020-02-21 DIAGNOSIS — J386 Stenosis of larynx: Secondary | ICD-10-CM | POA: Diagnosis not present

## 2020-02-28 DIAGNOSIS — I10 Essential (primary) hypertension: Secondary | ICD-10-CM | POA: Diagnosis not present

## 2020-02-28 DIAGNOSIS — I471 Supraventricular tachycardia: Secondary | ICD-10-CM | POA: Diagnosis not present

## 2020-02-28 DIAGNOSIS — R002 Palpitations: Secondary | ICD-10-CM | POA: Diagnosis not present

## 2020-02-28 DIAGNOSIS — R000 Tachycardia, unspecified: Secondary | ICD-10-CM | POA: Diagnosis not present

## 2020-03-27 ENCOUNTER — Other Ambulatory Visit: Payer: Self-pay | Admitting: Otolaryngology

## 2020-03-27 DIAGNOSIS — R490 Dysphonia: Secondary | ICD-10-CM | POA: Diagnosis not present

## 2020-03-27 DIAGNOSIS — J386 Stenosis of larynx: Secondary | ICD-10-CM | POA: Diagnosis not present

## 2020-04-24 DIAGNOSIS — J386 Stenosis of larynx: Secondary | ICD-10-CM | POA: Diagnosis not present

## 2020-04-24 DIAGNOSIS — R490 Dysphonia: Secondary | ICD-10-CM | POA: Diagnosis not present

## 2020-04-28 DIAGNOSIS — J386 Stenosis of larynx: Secondary | ICD-10-CM | POA: Diagnosis not present

## 2020-04-28 DIAGNOSIS — R490 Dysphonia: Secondary | ICD-10-CM | POA: Diagnosis not present

## 2020-07-02 DIAGNOSIS — I10 Essential (primary) hypertension: Secondary | ICD-10-CM | POA: Diagnosis not present

## 2020-07-02 DIAGNOSIS — R002 Palpitations: Secondary | ICD-10-CM | POA: Diagnosis not present

## 2020-07-02 DIAGNOSIS — R000 Tachycardia, unspecified: Secondary | ICD-10-CM | POA: Diagnosis not present

## 2020-07-02 DIAGNOSIS — Z23 Encounter for immunization: Secondary | ICD-10-CM | POA: Diagnosis not present

## 2020-07-15 DIAGNOSIS — J386 Stenosis of larynx: Secondary | ICD-10-CM | POA: Diagnosis not present

## 2020-07-18 ENCOUNTER — Encounter: Payer: Self-pay | Admitting: Obstetrics and Gynecology

## 2020-07-18 ENCOUNTER — Ambulatory Visit (INDEPENDENT_AMBULATORY_CARE_PROVIDER_SITE_OTHER): Payer: 59 | Admitting: Obstetrics and Gynecology

## 2020-07-18 ENCOUNTER — Other Ambulatory Visit: Payer: Self-pay

## 2020-07-18 ENCOUNTER — Other Ambulatory Visit (HOSPITAL_COMMUNITY)
Admission: RE | Admit: 2020-07-18 | Discharge: 2020-07-18 | Disposition: A | Discharge status: Home / Self Care | Payer: 59 | Source: Ambulatory Visit | Attending: Obstetrics and Gynecology | Admitting: Obstetrics and Gynecology

## 2020-07-18 VITALS — BP 140/90 | Ht 67.0 in | Wt 245.0 lb

## 2020-07-18 DIAGNOSIS — E669 Obesity, unspecified: Secondary | ICD-10-CM

## 2020-07-18 DIAGNOSIS — Z01419 Encounter for gynecological examination (general) (routine) without abnormal findings: Secondary | ICD-10-CM

## 2020-07-18 DIAGNOSIS — Z113 Encounter for screening for infections with a predominantly sexual mode of transmission: Secondary | ICD-10-CM

## 2020-07-18 DIAGNOSIS — Z1239 Encounter for other screening for malignant neoplasm of breast: Secondary | ICD-10-CM | POA: Diagnosis not present

## 2020-07-18 DIAGNOSIS — Z6838 Body mass index (BMI) 38.0-38.9, adult: Secondary | ICD-10-CM | POA: Diagnosis not present

## 2020-07-18 DIAGNOSIS — Z579 Occupational exposure to unspecified risk factor: Secondary | ICD-10-CM

## 2020-07-18 DIAGNOSIS — Z124 Encounter for screening for malignant neoplasm of cervix: Secondary | ICD-10-CM

## 2020-07-18 MED ORDER — PHENTERMINE HCL 37.5 MG PO TABS
37.5000 mg | ORAL_TABLET | Freq: Every day | ORAL | 0 refills | Status: DC
Start: 2020-07-18 — End: 2020-08-22

## 2020-07-18 NOTE — Progress Notes (Signed)
Gynecology Annual Exam   PCP: Hortencia Pilar, MD  Chief Complaint:  Chief Complaint  Patient presents with  . Gynecologic Exam    History of Present Illness: Patient is a 34 y.o. [id number] presents for annual exam. The patient has no complaints today.   LMP: No LMP recorded. (Menstrual status: IUD). Amenorhea on IUD  The patient is not currently sexually active. She currently uses IUD for contraception. The patient does perform self breast exams.  There is no notable family history of breast or ovarian cancer in her family.  The patient wears seatbelts: yes.   The patient has regular exercise: not asked.    The patient denies current symptoms of depression.     Patientis a 34 y.o. [id number] female, who presents for the evaluation of weight gain. She has gained 28 pounds primarily over 1 year. The patient states the following issues have contributed to her weight problem: decreased time for physical activity, dietary choices.  The patient has no additional symptoms. The patient specifically denies memory loss, muscle weakness, excessive thirst, and polyuria. Weight related co-morbidities include none. The patient's past medical history is notable for tachycardia. She has tried phentermine interventions in the past with good success.   Review of Systems: ROS  Past Medical History:  Patient Active Problem List   Diagnosis Date Noted  . Essential hypertension 12/13/2017  . BMI 40.0-44.9, adult ([place]) 10/14/2017  . 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 CESAREAN SECTION  11/26/11  . CESAREAN SECTION N/A 04/01/2016   Procedure: CESAREAN SECTION;  Surgeon: Gae Dry, MD;  Location: [facility] OR[facility];  Service: Obstetrics;  Laterality: N/A;  Time of birth 18:50 Sex: Female Weight: 7 lbs, 13 oz   . CESAREAN SECTION N/A 10/27/2017   Procedure: CESAREAN SECTION;   Surgeon: Malachy Mood, MD;  Location: [facility] ORS;  Service: Obstetrics;  Laterality: N/A;  . KNEE SURGERY Left 2005  . LARYNX SURGERY  2018  . TONSILLECTOMY      Gynecologic History:  No LMP recorded. (Menstrual status: IUD). Contraception:01/25/2019 Mirena IUD Last Pap: Results were:04/11/2017 NIL and HR HPV negative   Obstetric History: [id number]  Family History:  History reviewed. No pertinent family history.  Social History:  Social History   Socioeconomic History  . Marital status: Married    Spouse name: Not on file  . Number of children: Not on file  . Years of education: Not on file  . Highest education level: Not on file  Occupational History  . Occupation: cna    Employer: [employer]  Tobacco Use  . Smoking status: Never Smoker  . Smokeless tobacco: Never Used  Vaping Use  . Vaping Use: Never used  Substance and Sexual Activity  . Alcohol use: No    Alcohol/week: 0.0 standard drinks  . Drug use: No  . Sexual activity: Not Currently    Birth control/protection: I.U.D.  Other Topics Concern  . Not on file  Social History Narrative   Regular exercise--yes   Social Determinants of Health   Financial Resource Strain:   . Difficulty of Paying Living Expenses: Not on file  Food Insecurity:   . Worried About Charity fundraiser in the Last Year: Not on file  . Ran Out of Food in the Last Year: Not on file  Transportation Needs:   . Lack of  Transportation (Medical): Not on file  . Lack of Transportation (Non-Medical): Not on file  Physical Activity:   . Days of Exercise per Week: Not on file  . Minutes of Exercise per Session: Not on file  Stress:   . Feeling of Stress : Not on file  Social Connections:   . Frequency of Communication with Friends and Family: Not on file  . Frequency of Social Gatherings with Friends and Family: Not on file  . Attends Religious Services: Not on file  . Active Member of Clubs or Organizations: Not on file  . Attends English as a second language teacher Meetings: Not on file  . Marital Status: Not on file  Intimate Partner Violence:   . Fear of Current or Ex-Partner: Not on file  . Emotionally Abused: Not on file  . Physically Abused: Not on file  . Sexually Abused: Not on file    Allergies:  Allergies  Allergen Reactions  . Sulfa Antibiotics Hives  . Sulfasalazine Hives  . Sulfonamide Derivatives Hives    Medications: Prior to Admission medications   Medication Sig Start Date End Date Taking? Authorizing Provider  metoprolol succinate (TOPROL-XL) 25 MG 24 hr tablet Take by mouth. 37.5 mg 12/31/19 07/02/21 Yes [provider]  Multiple Vitamins-Minerals (HAIR SKIN AND NAILS FORMULA PO) Take by mouth.   Yes [provider]  omeprazole (PRILOSEC) 20 MG capsule  06/30/20  Yes [provider]    Physical Exam Vitals: Blood pressure 140/90, height 5\' 7"  (1.702 m), weight 245 lb (111.1 kg). Body mass index is 38.37 kg/m.  General: NAD HEENT: normocephalic, anicteric Thyroid: no enlargement, no palpable nodules Pulmonary: No increased work of breathing, CTAB Cardiovascular: RRR, distal pulses 2+ Breast: Breast symmetrical, no tenderness, no palpable nodules or masses, no skin or nipple retraction present, no nipple discharge.  No axillary or supraclavicular lymphadenopathy. Abdomen: NABS, soft, non-tender, non-distended.  Umbilicus without lesions.  No hepatomegaly, splenomegaly or masses palpable. No evidence of hernia  Genitourinary:  External: Normal external female genitalia.  Normal urethral meatus, normal Bartholin's and Skene's glands.    Vagina: Normal vaginal mucosa, no evidence of prolapse.    Cervix: Grossly normal in appearance, no bleeding  Uterus: Non-enlarged, mobile, normal contour.  No CMT  Adnexa: ovaries non-enlarged, no adnexal masses  Rectal: deferred  Lymphatic: no evidence of inguinal lymphadenopathy Extremities: no edema, erythema, or tenderness Neurologic:  Grossly intact Psychiatric: mood appropriate, affect full  Female chaperone present for pelvic and breast  portions of the physical exam   Immunization History  Administered Date(s) Administered  . Influenza-Unspecified 07/03/2020  . PFIZER SARS-COV-2 Vaccination 09/03/2019, 09/24/2019  . Tdap 09/14/2017    Assessment: 34 y.o. [id number] routine annual exam  Plan: Problem List Items Addressed This Visit    None    Visit Diagnoses    Encounter for gynecological examination without abnormal finding    -  Primary   Screening for malignant neoplasm of cervix       Relevant Orders   Cytology - PAP   Breast screening       Routine screening for STI (sexually transmitted infection)       Relevant Orders   HEP, RPR, HIV Panel   Hepatitis C antibody   Occupational exposure in workplace       Relevant Orders   HEP, RPR, HIV Panel   Hepatitis C antibody   Class 2 obesity without serious comorbidity with body mass index (BMI) of 38.0 to 38.9 in adult, unspecified  obesity type       Relevant Medications   phentermine (ADIPEX-P) 37.5 MG tablet     Annual 1) STI screening  wasoffered and accepted  2)  ASCCP guidelines and rational discussed.  Patient opts for every 3 years screening interval  3) Contraception - the patient is currently using  IUD.  She is happy with her current form of contraception and plans to continue  4) Routine healthcare maintenance including cholesterol, diabetes screening discussed managed by PCP  5) Return in about 4 weeks (around 08/15/2020) for medication follow up in person or phone.   Obesity 1) 1500 Calorie ADA Diet  2) Patient education given regarding appropriate lifestyle changes for weight loss including: regular physical activity, healthy coping strategies, caloric restriction and healthy eating patterns.  3) Patient will be started on weight loss medication. The risks and benefits and side effects of medication, such as Adipex (Phenteramine) ,   Tenuate (Diethylproprion), Belviq (lorcarsin), Contrave (buproprion/naltrexone), Qsymia (phentermine/topiramate), and Saxenda (liraglutide) is discussed. The pros and cons of suppressing appetite and boosting metabolism is discussed. Risks of tolerence and addiction is discussed for selected agents discussed. Use of medicine will ne short term, such as 3-4 months at a time followed by a period of time off of the medicine to avoid these risks and side effects for Adipex, Qsymia, and Tenuate discussed. Pt to call with any negative side effects and agrees to keep follow up appts.  4) Comorbidity Screening - hypothyroidism screening, diabetes, and hyperlipidemia screening offered  5) Encouraged weekly weight monitorig to track progress and sample 1 week food diary  6) Contraception - discussed that all weight loss drugs fall in to pregnancy category X, patient currently has reliable contraception in the form of IUD  7) 15 minutes face-to-face; counseling/coordination of care > 50 percent of visit  8) Follow up in 4 weeks to assess response    Malachy Mood, MD, [place], [facility] 07/18/2020, 9:38 AM

## 2020-07-19 LAB — HEP, RPR, HIV PANEL
HIV Screen 4th Generation wRfx: NONREACTIVE
Hepatitis B Surface Ag: NEGATIVE
RPR Ser Ql: NONREACTIVE

## 2020-07-19 LAB — HEPATITIS C ANTIBODY: Hep C Virus Ab: <0.1 s/co ratio (ref 0.0–0.9)

## 2020-07-22 ENCOUNTER — Other Ambulatory Visit: Payer: Self-pay | Admitting: Physician Assistant

## 2020-07-24 LAB — CYTOLOGY - PAP
Adequacy: ABSENT
Comment: NEGATIVE
Diagnosis: NEGATIVE
Diagnosis: REACTIVE
High risk HPV: NEGATIVE

## 2020-08-06 ENCOUNTER — Encounter: Payer: Self-pay | Admitting: Obstetrics and Gynecology

## 2020-08-15 ENCOUNTER — Other Ambulatory Visit: Payer: Self-pay

## 2020-08-15 ENCOUNTER — Ambulatory Visit (INDEPENDENT_AMBULATORY_CARE_PROVIDER_SITE_OTHER): Payer: 59 | Admitting: Obstetrics and Gynecology

## 2020-08-15 ENCOUNTER — Encounter: Payer: Self-pay | Admitting: Obstetrics and Gynecology

## 2020-08-15 VITALS — BP 148/100 | Ht 67.0 in | Wt 236.0 lb

## 2020-08-15 DIAGNOSIS — Z6839 Body mass index (BMI) 39.0-39.9, adult: Secondary | ICD-10-CM

## 2020-08-15 NOTE — Progress Notes (Signed)
F/U medication. RM 4

## 2020-08-15 NOTE — Progress Notes (Signed)
Gynecology Office Visit  Chief Complaint:  Chief Complaint  Patient presents with  . Follow-up    F/U medication. RM 4    History of Present Illness: Patientis a 34 y.o. [phone] female, who presents for the evaluation of the desire to lose weight. She has lost 9 pounds 1 months. The patient states the following symptoms since starting her weight loss therapy: appetite suppression, energy, and weight loss.  The patient also reports no other ill effects. The patient specifically denies heart palpitations, anxiety, and insomnia.    Review of Systems: 10 point review of systems negative unless otherwise noted in HPI  Past Medical History:  Past Medical History:  Diagnosis Date  . Allergy   . Family history of ovarian cancer    12/21 cancer genetic testing letter sent  . GERD (gastroesophageal reflux disease)   . Gestational diabetes   . History of chicken pox   . Hypertension   . Subglottic stenosis   . Tachycardia     Past Surgical History:  Past Surgical History:  Procedure Laterality Date  . CESAREAN SECTION  11/26/11  . CESAREAN SECTION N/A 04/01/2016   Procedure: CESAREAN SECTION;  Surgeon: Gae Dry, MD;  Location: [facility] ORS;  Service: Obstetrics;  Laterality: N/A;  Time of birth 18:50 Sex: Female Weight: 7 lbs, 13 oz   . CESAREAN SECTION N/A 10/27/2017   Procedure: CESAREAN SECTION;  Surgeon: Malachy Mood, MD;  Location: [facility] ORS;  Service: Obstetrics;  Laterality: N/A;  . KNEE SURGERY Left 2005  . LARYNX SURGERY  2018  . TONSILLECTOMY      Gynecologic History: No LMP recorded. (Menstrual status: IUD).  Obstetric History: [id number]  Family History:  Family History  Problem Relation Age of Onset  . Ovarian cancer Maternal Grandmother   . Breast cancer Neg Hx     Social History:  Social History   Socioeconomic History  . Marital status: Married    Spouse name: Not on file  . Number of children: Not on file  . Years of education: Not on file   . Highest education level: Not on file  Occupational History  . Occupation: cna    Employer: [employer]  Tobacco Use  . Smoking status: Never Smoker  . Smokeless tobacco: Never Used  Vaping Use  . Vaping Use: Never used  Substance and Sexual Activity  . Alcohol use: No    Alcohol/week: 0.0 standard drinks  . Drug use: No  . Sexual activity: Not Currently    Birth control/protection: I.U.D.  Other Topics Concern  . Not on file  Social History Narrative   Regular exercise--yes   Social Determinants of Health   Financial Resource Strain: Not on file  Food Insecurity: Not on file  Transportation Needs: Not on file  Physical Activity: Not on file  Stress: Not on file  Social Connections: Not on file  Intimate Partner Violence: Not on file    Allergies:  Allergies  Allergen Reactions  . Sulfa Antibiotics Hives  . Sulfasalazine Hives  . Sulfonamide Derivatives Hives    Medications: Prior to Admission medications   Medication Sig Start Date End Date Taking? Authorizing Provider  diltiazem [facility]) 120 MG 24 hr capsule Take by mouth. 07/22/20 07/22/21 Yes [provider]  Multiple Vitamins-Minerals (HAIR SKIN AND NAILS FORMULA PO) Take by mouth.   Yes [provider]  omeprazole (PRILOSEC) 20 MG capsule  06/30/20  Yes [provider]  phentermine (ADIPEX-P) 37.5 MG  tablet Take 1 tablet (37.5 mg total) by mouth daily before breakfast. 07/18/20  Yes Malachy Mood, MD  metoprolol succinate (TOPROL-XL) 25 MG 24 hr tablet Take by mouth. 37.5 mg Patient not taking: Reported on 08/15/2020 12/31/19 07/02/21  [provider]    Physical Exam Blood pressure (!) 148/100, height 5\' 7"  (1.702 m), weight 236 lb (107 kg). Wt Readings from Last 3 Encounters:  08/15/20 236 lb (107 kg)  07/18/20 245 lb (111.1 kg)  05/02/19 207 lb (93.9 kg)  Body mass index is 36.96 kg/m.   General: NAD HEENT: normocephalic, anicteric Thyroid: no  enlargement Pulmonary: no increased work of breathing Neurologic: Grossly intact Psychiatric: mood appropriate, affect full  Assessment: 34 y.o. [id number] medical weight loss follow up  Plan: Problem List Items Addressed This Visit   None   Visit Diagnoses    Class 2 severe obesity with serious comorbidity and body mass index (BMI) of 39.0 to 39.9 in adult, unspecified obesity type ([place])    -  Primary      1) 1500 Calorie ADA Diet  2) Patient education given regarding appropriate lifestyle changes for weight loss including: regular physical activity, healthy coping strategies, caloric restriction and healthy eating patterns.  3) Patient will be started on weight loss medication. The risks and benefits and side effects of medication, such as Adipex (Phenteramine) ,  Tenuate (Diethylproprion), Contrave (buproprion/naltrexone), Qsymia (phentermine/topiramate), and Saxenda (liraglutide) is discussed. The pros and cons of suppressing appetite and boosting metabolism is discussed. Risks of tolerence and addiction is discussed for selected agents discussed. Use of medicine will ne short term, such as 3-4 months at a time followed by a period of time off of the medicine to avoid these risks and side effects for Adipex, Qsymia, and Tenuate discussed. Pt to call with any negative side effects and agrees to keep follow up appts.  4) Patient to take medication, with the benefits of appetite suppression and metabolism boost d/w pt, along with the side effects and risk factors of long term use that will be avoided with our use of short bursts of therapy. Rx provided.   - continue phentermine - no palpitation or changes in heart rate - BP mildly elevated but going from night to days  5) 15 minutes face-to-face; with counseling/coordination of care > 50 percent of visit related to obesity and ongoing management/treatment   6)  Return in about 4 weeks (around 09/12/2020) for medication follow  up.    Malachy Mood, MD, [facility], [facility] 08/15/2020, 8:28 AM

## 2020-08-22 ENCOUNTER — Other Ambulatory Visit: Payer: Self-pay | Admitting: Obstetrics and Gynecology

## 2020-08-22 MED ORDER — PHENTERMINE HCL 37.5 MG PO TABS: 37.5000 mg | ORAL_TABLET | Freq: Every day | ORAL | 0 refills | Status: DC

## 2020-09-08 ENCOUNTER — Telehealth: Payer: 59 | Admitting: Family

## 2020-09-08 DIAGNOSIS — B86 Scabies: Secondary | ICD-10-CM | POA: Diagnosis not present

## 2020-09-08 MED ORDER — PERMETHRIN 5 % EX CREA: 1.0000 "application " | TOPICAL_CREAM | Freq: Once | CUTANEOUS | 1 refills | Status: AC

## 2020-09-08 NOTE — Progress Notes (Signed)
E-Visit for Scabies  We are sorry that you are not feeling well. Here is how we plan to help!  Based on what you shared with me it looks like you have scabies.  Scabies is an infection caused by very tiny mites that burrow into the skin.  They cause severe itching. Though children are most commonly infected, anyone can get scabies.  Scabies mites can pass from person to person through physical close contact.  They can also be passed through shared clothing, towels, and bedding.  Scabies infection is not usually dangerous, but it is uncomfortable.  Because it is so contagious, scabies should be treated immediately to keep the infection from spreading.  If you have children in day care, please have any exposed children examined by their pediatrician. .   I have prescribed Permethrin topical cream 5% thoroughly massage cream from head to soles of feet; leave on for 8 to 14 hours before removing (shower or bath). May repeat if living mites are observed 14 days after first treatment; one application is generally curative.    HOME CARE:  . You should treat all members in your household whether they show symptoms or not. . Wash towels, clothes, bed linens, cloth toys, hats, and other personal items in hot water and dry on high heat. . Vacuum floors and furniture and throw away the bag afterwards. . Keep your child home from day care or school until the morning after treatment for scabies. . Notify your child's day care or school so that other children can be checked and treated.  GET HELP RIGHT AWAY IF:  . The infected person has a fever, red streaks, pain, or swelling of the skin. . Sores get worse or don't heal. . New rashes appear or itching continues for more than 2 weeks after treatment.  MAKE SURE YOU:   Understand these instructions.  Will watch your condition.  Will get help right away if you are not doing well or get worse.  Thank you for choosing an e-visit. Your e-visit answers  were reviewed by a board certified advanced clinical practitioner to complete your personal care plan.  Depending upon the condition, your plan could have included both over the counter or prescription medications.    Please review your pharmacy choice.  Make sure the pharmacy is open so you can pick up prescription now.   If there is a problem, you may contact your provider through MyChart messaging and have the prescription routed to another pharmacy. Your safety is important to us.  If you have drug allergies check your prescription carefully.    For the next 24 hours you can use MyChart to ask questions about today's visit, request a non-urgent call back, or ask for a work or school excuse.  You will get an email in the next 2 days asking about your experience.  I hope that your e-visit has been valuable and will speed your recovery.  Approximately 5 minutes was spent documenting and reviewing patient's chart.     

## 2020-09-15 ENCOUNTER — Ambulatory Visit: Payer: 59 | Admitting: Obstetrics and Gynecology

## 2020-09-19 ENCOUNTER — Other Ambulatory Visit: Payer: Self-pay

## 2020-09-19 ENCOUNTER — Encounter: Payer: Self-pay | Admitting: Obstetrics and Gynecology

## 2020-09-19 ENCOUNTER — Other Ambulatory Visit: Payer: Self-pay | Admitting: Obstetrics and Gynecology

## 2020-09-19 ENCOUNTER — Ambulatory Visit (INDEPENDENT_AMBULATORY_CARE_PROVIDER_SITE_OTHER): Payer: 59 | Admitting: Obstetrics and Gynecology

## 2020-09-19 VITALS — BP 152/98 | Ht 67.0 in | Wt 229.0 lb

## 2020-09-19 DIAGNOSIS — Z6835 Body mass index (BMI) 35.0-35.9, adult: Secondary | ICD-10-CM

## 2020-09-19 DIAGNOSIS — E669 Obesity, unspecified: Secondary | ICD-10-CM | POA: Diagnosis not present

## 2020-09-19 MED ORDER — PHENTERMINE HCL 37.5 MG PO TABS: 37.5000 mg | ORAL_TABLET | Freq: Every day | ORAL | 0 refills | Status: DC

## 2020-09-19 NOTE — Progress Notes (Signed)
Gynecology Office Visit  Chief Complaint:  Chief Complaint  Patient presents with  . Follow-up    F/U medication. RM 4    History of Present Illness: Patientis a 35 y.o. [phone] female, who presents for the evaluation of the desire to lose weight. She has lost 7 pounds 1 months, 16lbs in 2 months. The patient states the following symptoms since starting her weight loss therapy: appetite suppression, energy, and weight loss.  The patient also reports no other ill effects. The patient specifically denies heart palpitations, anxiety, and insomnia.    Review of Systems: 10 point review of systems negative unless otherwise noted in HPI  Past Medical History:  Past Medical History:  Diagnosis Date  . Allergy   . Family history of ovarian cancer    12/21 cancer genetic testing letter sent  . GERD (gastroesophageal reflux disease)   . Gestational diabetes   . History of chicken pox   . Hypertension   . Subglottic stenosis   . Tachycardia     Past Surgical History:  Past Surgical History:  Procedure Laterality Date  . CESAREAN SECTION  11/26/11  . CESAREAN SECTION N/A 04/01/2016   Procedure: CESAREAN SECTION;  Surgeon: Gae Dry, MD;  Location: [facility] ORS;  Service: Obstetrics;  Laterality: N/A;  Time of birth 18:50 Sex: Female Weight: 7 lbs, 13 oz   . CESAREAN SECTION N/A 10/27/2017   Procedure: CESAREAN SECTION;  Surgeon: Malachy Mood, MD;  Location: [facility] ORS;  Service: Obstetrics;  Laterality: N/A;  . KNEE SURGERY Left 2005  . LARYNX SURGERY  2018  . TONSILLECTOMY      Gynecologic History: No LMP recorded. (Menstrual status: IUD).  Obstetric History: [id number]  Family History:  Family History  Problem Relation Age of Onset  . Ovarian cancer Maternal Grandmother   . Breast cancer Neg Hx     Social History:  Social History   Socioeconomic History  . Marital status: Married    Spouse name: Not on file  . Number of children: Not on file  . Years of  education: Not on file  . Highest education level: Not on file  Occupational History  . Occupation: cna    Employer: [employer]  Tobacco Use  . Smoking status: Never Smoker  . Smokeless tobacco: Never Used  Vaping Use  . Vaping Use: Never used  Substance and Sexual Activity  . Alcohol use: No    Alcohol/week: 0.0 standard drinks  . Drug use: No  . Sexual activity: Not Currently    Birth control/protection: I.U.D.  Other Topics Concern  . Not on file  Social History Narrative   Regular exercise--yes   Social Determinants of Health   Financial Resource Strain: Not on file  Food Insecurity: Not on file  Transportation Needs: Not on file  Physical Activity: Not on file  Stress: Not on file  Social Connections: Not on file  Intimate Partner Violence: Not on file    Allergies:  Allergies  Allergen Reactions  . Sulfa Antibiotics Hives  . Sulfasalazine Hives  . Sulfonamide Derivatives Hives    Medications: Prior to Admission medications   Medication Sig Start Date End Date Taking? Authorizing Provider  diltiazem [facility]) 120 MG 24 hr capsule Take by mouth. 07/22/20 07/22/21  [provider]  metoprolol succinate (TOPROL-XL) 25 MG 24 hr tablet Take by mouth. 37.5 mg Patient not taking: Reported on 08/15/2020 12/31/19 07/02/21  [provider]  Multiple Vitamins-Minerals (HAIR SKIN AND  NAILS FORMULA PO) Take by mouth.    [provider]  omeprazole (PRILOSEC) 20 MG capsule  06/30/20   [provider]  phentermine (ADIPEX-P) 37.5 MG tablet Take 1 tablet (37.5 mg total) by mouth daily before breakfast. 08/22/20   Malachy Mood, MD    Physical Exam Blood pressure (!) 152/98, height 5\' 7"  (1.702 m), weight 229 lb (103.9 kg).  Wt Readings from Last 3 Encounters:  09/19/20 229 lb (103.9 kg)  08/15/20 236 lb (107 kg)  07/18/20 245 lb (111.1 kg)   Body mass index is 35.87 kg/m.  General: NAD HEENT: normocephalic, anicteric Thyroid:  no enlargement Pulmonary: no increased work of breathing Neurologic: Grossly intact Psychiatric: mood appropriate, affect full  Assessment: 35 y.o. [id number] No problem-specific Assessment & Plan notes found for this encounter.   Plan: Problem List Items Addressed This Visit   None   Visit Diagnoses    Class 2 obesity without serious comorbidity with body mass index (BMI) of 35.0 to 35.9 in adult, unspecified obesity type    -  Primary   Relevant Medications   phentermine (ADIPEX-P) 37.5 MG tablet      1) 1500 Calorie ADA Diet  2) Patient education given regarding appropriate lifestyle changes for weight loss including: regular physical activity, healthy coping strategies, caloric restriction and healthy eating patterns.  3) Patient will be started on weight loss medication. The risks and benefits and side effects of medication, such as Adipex (Phenteramine) ,  Tenuate (Diethylproprion), Contrave (buproprion/naltrexone), Qsymia (phentermine/topiramate), and Saxenda (liraglutide) is discussed. The pros and cons of suppressing appetite and boosting metabolism is discussed. Risks of tolerence and addiction is discussed for selected agents discussed. Use of medicine will ne short term, such as 3-4 months at a time followed by a period of time off of the medicine to avoid these risks and side effects for Adipex, Qsymia, and Tenuate discussed. Pt to call with any negative side effects and agrees to keep follow up appts.  4) Patient to take medication, with the benefits of appetite suppression and metabolism boost d/w pt, along with the side effects and risk factors of long term use that will be avoided with our use of short bursts of therapy. Rx provided.   - (did not take metoprolol today)  5) 15 minutes face-to-face; with counseling/coordination of care > 50 percent of visit related to obesity and ongoing management/treatment   6)  Return in about 4 weeks (around 10/17/2020) for medication  follow up.    Malachy Mood, MD, [facility], [place]

## 2020-10-16 ENCOUNTER — Encounter: Payer: Self-pay | Admitting: Obstetrics and Gynecology

## 2020-10-16 ENCOUNTER — Ambulatory Visit (INDEPENDENT_AMBULATORY_CARE_PROVIDER_SITE_OTHER): Payer: 59 | Admitting: Obstetrics and Gynecology

## 2020-10-16 ENCOUNTER — Other Ambulatory Visit: Payer: Self-pay | Admitting: Obstetrics and Gynecology

## 2020-10-16 ENCOUNTER — Other Ambulatory Visit: Payer: Self-pay

## 2020-10-16 VITALS — BP 142/96 | Ht 67.0 in | Wt 231.0 lb

## 2020-10-16 DIAGNOSIS — R1013 Epigastric pain: Secondary | ICD-10-CM | POA: Diagnosis not present

## 2020-10-16 DIAGNOSIS — Z6836 Body mass index (BMI) 36.0-36.9, adult: Secondary | ICD-10-CM

## 2020-10-16 DIAGNOSIS — E669 Obesity, unspecified: Secondary | ICD-10-CM | POA: Diagnosis not present

## 2020-10-16 MED ORDER — PHENTERMINE HCL 37.5 MG PO TABS: 37.5000 mg | ORAL_TABLET | Freq: Every day | ORAL | 0 refills | Status: DC

## 2020-10-16 NOTE — Progress Notes (Signed)
Gynecology Office Visit  Chief Complaint:  Chief Complaint  Patient presents with  . Follow-up    F/U Medication - RM 2    History of Present Illness: Patientis a 35 y.o. [id number] female, who presents for the evaluation of the desire to lose weight. She has gained 2 pounds 1 months, total weight loss remains at 14lbs for past 3 months. The patient states the following symptoms since starting her weight loss therapy: appetite suppression, energy, and weight loss.  T he patient also reports no other ill effects. The patient specifically denies heart palpitations, anxiety, and insomnia.   Has had some difficulty maintaining diet with recent move.  Has noted intermittent but daily epigastric pain.  Pain worsens with food ingestion.  No nausea or emesis, no fevers or chills.  Has noted some improvement with sucralfate which she was given by a friend.   Review of Systems: 10 point review of systems negative unless otherwise noted in HPI  Past Medical History:  Past Medical History:  Diagnosis Date  . Allergy   . Family history of ovarian cancer    12/21 cancer genetic testing letter sent  . GERD (gastroesophageal reflux disease)   . Gestational diabetes   . History of chicken pox   . Hypertension   . Subglottic stenosis   . Tachycardia     Past Surgical History:  Past Surgical History:  Procedure Laterality Date  . CESAREAN SECTION  11/26/11  . CESAREAN SECTION N/A 04/01/2016   Procedure: CESAREAN SECTION;  Surgeon: Gae Dry, MD;  Location: [facility] ORS;  Service: Obstetrics;  Laterality: N/A;  Time of birth 18:50 Sex: Female Weight: 7 lbs, 13 oz   . CESAREAN SECTION N/A 10/27/2017   Procedure: CESAREAN SECTION;  Surgeon: Malachy Mood, MD;  Location: [facility] ORS;  Service: Obstetrics;  Laterality: N/A;  . KNEE SURGERY Left 2005  . LARYNX SURGERY  2018  . TONSILLECTOMY      Gynecologic History: No LMP recorded. (Menstrual status: IUD).  Obstetric History:  [id number]  Family History:  Family History  Problem Relation Age of Onset  . Ovarian cancer Maternal Grandmother   . Breast cancer Neg Hx     Social History:  Social History   Socioeconomic History  . Marital status: Legally Separated    Spouse name: Not on file  . Number of children: Not on file  . Years of education: Not on file  . Highest education level: Not on file  Occupational History  . Occupation: cna    Employer: [employer]  Tobacco Use  . Smoking status: Never Smoker  . Smokeless tobacco: Never Used  Vaping Use  . Vaping Use: Never used  Substance and Sexual Activity  . Alcohol use: No    Alcohol/week: 0.0 standard drinks  . Drug use: No  . Sexual activity: Not Currently    Birth control/protection: I.U.D.  Other Topics Concern  . Not on file  Social History Narrative   Regular exercise--yes   Social Determinants of Health   Financial Resource Strain: Not on file  Food Insecurity: Not on file  Transportation Needs: Not on file  Physical Activity: Not on file  Stress: Not on file  Social Connections: Not on file  Intimate Partner Violence: Not on file    Allergies:  Allergies  Allergen Reactions  . Sulfa Antibiotics Hives  . Sulfasalazine Hives  . Sulfonamide Derivatives Hives    Medications: Prior to Admission medications   Medication Sig  Start Date End Date Taking? Authorizing Provider  diltiazem [facility]) 120 MG 24 hr capsule Take by mouth. 07/22/20 07/22/21 Yes [provider]  omeprazole (PRILOSEC) 20 MG capsule  06/30/20  Yes [provider]  phentermine (ADIPEX-P) 37.5 MG tablet Take 1 tablet (37.5 mg total) by mouth daily before breakfast. 09/19/20  Yes Malachy Mood, MD  metoprolol succinate (TOPROL-XL) 25 MG 24 hr tablet Take by mouth. 37.5 mg Patient not taking: Reported on 08/15/2020 12/31/19 07/02/21  [provider]  Multiple Vitamins-Minerals (HAIR SKIN AND NAILS FORMULA PO) Take by mouth.    [provider]    Physical Exam Blood pressure (!) 142/96, height 5\' 7"  (1.702 m), weight 231 lb (104.8 kg). Wt Readings from Last 3 Encounters:  10/16/20 231 lb (104.8 kg)  09/19/20 229 lb (103.9 kg)  08/15/20 236 lb (107 kg)  Body mass index is 36.18 kg/m.  General: NAD HEENT: normocephalic, anicteric Thyroid: no enlargement Pulmonary: no increased work of breathing Neurologic: Grossly intact Psychiatric: mood appropriate, affect full  Assessment: 35 y.o. [id number] No problem-specific Assessment & Plan notes found for this encounter.   Plan: Problem List Items Addressed This Visit   None     1) 1500 Calorie ADA Diet  2) Patient education given regarding appropriate lifestyle changes for weight loss including: regular physical activity, healthy coping strategies, caloric restriction and healthy eating patterns.  3) Patient to take medication, with the benefits of appetite suppression and metabolism boost d/w pt, along with the side effects and risk factors of long term use that will be avoided with our use of short bursts of therapy. Rx provided.  - if no weight loss this month will consider 3 month hiatus/drug holiday vs Saxenda or Contrave  4) Epigastric pain - will send to GI for evaluation likely gastritis.  Patient is concerned about possible need to EGD as she is worried about anesthesia given her history of subglottic stenosis    5) 15 minutes face-to-face; with counseling/coordination of care > 50 percent of visit related to obesity and ongoing management/treatment   6)  Return in about 4 weeks (around 11/13/2020) for medication follow up.    Malachy Mood, MD, [facility], [facility] 10/16/2020, 10:35 AM

## 2020-10-22 ENCOUNTER — Other Ambulatory Visit: Payer: Self-pay | Admitting: Otolaryngology

## 2020-11-03 DIAGNOSIS — Z0000 Encounter for general adult medical examination without abnormal findings: Secondary | ICD-10-CM | POA: Diagnosis not present

## 2020-11-03 DIAGNOSIS — R739 Hyperglycemia, unspecified: Secondary | ICD-10-CM | POA: Diagnosis not present

## 2020-11-03 DIAGNOSIS — I10 Essential (primary) hypertension: Secondary | ICD-10-CM | POA: Diagnosis not present

## 2020-11-06 ENCOUNTER — Other Ambulatory Visit: Payer: Self-pay | Admitting: Physician Assistant

## 2020-11-06 DIAGNOSIS — R0602 Shortness of breath: Secondary | ICD-10-CM | POA: Diagnosis not present

## 2020-11-06 DIAGNOSIS — R000 Tachycardia, unspecified: Secondary | ICD-10-CM | POA: Diagnosis not present

## 2020-11-06 DIAGNOSIS — I10 Essential (primary) hypertension: Secondary | ICD-10-CM | POA: Diagnosis not present

## 2020-11-11 ENCOUNTER — Other Ambulatory Visit: Payer: Self-pay | Admitting: Physician Assistant

## 2020-11-11 DIAGNOSIS — R0602 Shortness of breath: Secondary | ICD-10-CM

## 2020-11-11 DIAGNOSIS — R000 Tachycardia, unspecified: Secondary | ICD-10-CM

## 2020-11-14 ENCOUNTER — Ambulatory Visit (INDEPENDENT_AMBULATORY_CARE_PROVIDER_SITE_OTHER): Payer: 59 | Admitting: Obstetrics and Gynecology

## 2020-11-14 ENCOUNTER — Encounter: Payer: Self-pay | Admitting: Obstetrics and Gynecology

## 2020-11-14 ENCOUNTER — Other Ambulatory Visit: Payer: Self-pay | Admitting: Obstetrics and Gynecology

## 2020-11-14 ENCOUNTER — Other Ambulatory Visit: Payer: Self-pay

## 2020-11-14 VITALS — BP 128/92 | Ht 67.0 in | Wt 223.0 lb

## 2020-11-14 DIAGNOSIS — E669 Obesity, unspecified: Secondary | ICD-10-CM

## 2020-11-14 DIAGNOSIS — Z6834 Body mass index (BMI) 34.0-34.9, adult: Secondary | ICD-10-CM

## 2020-11-14 MED ORDER — PHENTERMINE HCL 37.5 MG PO TABS: 37.5000 mg | ORAL_TABLET | Freq: Every day | ORAL | 0 refills | Status: DC

## 2020-11-14 NOTE — Progress Notes (Signed)
Gynecology Office Visit  Chief Complaint:  Chief Complaint  Patient presents with   Weight Check    F/U weight loss, no concerns. RM 6    History of Present Illness: Patientis a 35 y.o. [phone] female, who presents for the evaluation of the desire to lose weight. She has lost 8 pounds 1 months, 22lbs since November. The patient states the following symptoms since starting her weight loss therapy: appetite suppression, energy, and weight loss.  The patient also reports no other ill effects. The patient specifically denies heart palpitations, anxiety, and insomnia.   This month has been less stressful as she has become settled in after the move which has allowed her to establish a better routine.   Review of Systems: 10 point review of systems negative unless otherwise noted in HPI  Past Medical History:  Past Medical History:  Diagnosis Date   Allergy    Family history of ovarian cancer    12/21 cancer genetic testing letter sent   GERD (gastroesophageal reflux disease)    Gestational diabetes    History of chicken pox    Hypertension    Subglottic stenosis    Tachycardia     Past Surgical History:  Past Surgical History:  Procedure Laterality Date   CESAREAN SECTION  11/26/11   CESAREAN SECTION N/A 04/01/2016   Procedure: CESAREAN SECTION;  Surgeon: Gae Dry, MD;  Location: [facility] ORS;  Service: Obstetrics;  Laterality: N/A;  Time of birth 18:[birth year] Sex: Female Weight: 7 lbs, 13 oz    CESAREAN SECTION N/A 10/27/2017   Procedure: CESAREAN SECTION;  Surgeon: Malachy Mood, MD;  Location: [facility] ORS;  Service: Obstetrics;  Laterality: N/A;   KNEE SURGERY Left 2005   LARYNX SURGERY  2018   TONSILLECTOMY      Gynecologic History: No LMP recorded. (Menstrual status: IUD).  Obstetric History: [id number]  Family History:  Family History  Problem Relation Age of Onset   Ovarian cancer Maternal Grandmother    Breast cancer Neg Hx     Social History:   Social History   Socioeconomic History   Marital status: Legally Separated    Spouse name: Not on file   Number of children: Not on file   Years of education: Not on file   Highest education level: Not on file  Occupational History   Occupation: cna    Employer: [employer]  Tobacco Use   Smoking status: Never Smoker   Smokeless tobacco: Never Used  Vaping Use   Vaping Use: Never used  Substance and Sexual Activity   Alcohol use: No    Alcohol/week: 0.0 standard drinks   Drug use: No   Sexual activity: Not Currently    Birth control/protection: I.U.D.  Other Topics Concern   Not on file  Social History Narrative   Regular exercise--yes   Social Determinants of Health   Financial Resource Strain: Not on file  Food Insecurity: Not on file  Transportation Needs: Not on file  Physical Activity: Not on file  Stress: Not on file  Social Connections: Not on file  Intimate Partner Violence: Not on file    Allergies:  Allergies  Allergen Reactions   Sulfa Antibiotics Hives   Sulfasalazine Hives   Sulfonamide Derivatives Hives    Medications: Prior to Admission medications   Medication Sig Start Date End Date Taking? Authorizing Provider  diltiazem [facility]) 120 MG 24 hr capsule Take by mouth. 07/22/20 07/22/21 Yes [provider]  Multiple Vitamins-Minerals (  HAIR SKIN AND NAILS FORMULA PO) Take by mouth.   Yes [provider]  omeprazole (PRILOSEC) 20 MG capsule  06/30/20  Yes [provider]  phentermine (ADIPEX-P) 37.5 MG tablet Take 1 tablet (37.5 mg total) by mouth daily before breakfast. 10/16/20  Yes Malachy Mood, MD  metoprolol succinate (TOPROL-XL) 25 MG 24 hr tablet Take by mouth. 37.5 mg Patient not taking: Reported on 08/15/2020 12/31/19 07/02/21  [provider]    Physical Exam Blood pressure (!) 128/92, height 5\' 7"  (1.702 m), weight 223 lb (101.2 kg). Wt Readings from Last 3 Encounters:  11/14/20  223 lb (101.2 kg)  10/16/20 231 lb (104.8 kg)  09/19/20 229 lb (103.9 kg)  Body mass index is 34.93 kg/m.   General: NAD HEENT: normocephalic, anicteric Thyroid: no enlargement Pulmonary: no increased work of breathing Neurologic: Grossly intact Psychiatric: mood appropriate, affect full  Assessment: 35 y.o. [id number] medication follow up  Plan: Problem List Items Addressed This Visit   None   Visit Diagnoses    Class 1 obesity without serious comorbidity with body mass index (BMI) of 34.0 to 34.9 in adult, unspecified obesity type    -  Primary   Relevant Medications   phentermine (ADIPEX-P) 37.5 MG tablet      1) 1500 Calorie ADA Diet  2) Patient education given regarding appropriate lifestyle changes for weight loss including: regular physical activity, healthy coping strategies, caloric restriction and healthy eating patterns.  3) Patient will be started on weight loss medication. The risks and benefits and side effects of medication, such as Adipex (Phenteramine) ,  Tenuate (Diethylproprion), Belviq (lorcarsin), Contrave (buproprion/naltrexone), Qsymia (phentermine/topiramate), and Saxenda (liraglutide) is discussed. The pros and cons of suppressing appetite and boosting metabolism is discussed. Risks of tolerence and addiction is discussed for selected agents discussed. Use of medicine will ne short term, such as 3-4 months at a time followed by a period of time off of the medicine to avoid these risks and side effects for Adipex, Qsymia, and Tenuate discussed. Pt to call with any negative side effects and agrees to keep follow up appts.  4) Patient to take medication, with the benefits of appetite suppression and metabolism boost d/w pt, along with the side effects and risk factors of long term use that will be avoided with our use of short bursts of therapy. Rx provided.    5) 15 minutes face-to-face; with counseling/coordination of care > 50 percent of visit related to  obesity and ongoing management/treatment   6)  Return in about 4 weeks (around 12/12/2020) for medication follow up.    Malachy Mood, MD, [facility], [facility] 11/14/2020, 9:23 AM

## 2020-11-20 ENCOUNTER — Other Ambulatory Visit: Payer: Self-pay

## 2020-11-20 ENCOUNTER — Ambulatory Visit
Admission: RE | Admit: 2020-11-20 | Discharge: 2020-11-20 | Disposition: A | Discharge status: Home / Self Care | Payer: 59 | Source: Ambulatory Visit | Attending: Physician Assistant | Admitting: Physician Assistant

## 2020-11-20 DIAGNOSIS — R0602 Shortness of breath: Secondary | ICD-10-CM | POA: Insufficient documentation

## 2020-11-20 DIAGNOSIS — R000 Tachycardia, unspecified: Secondary | ICD-10-CM | POA: Diagnosis not present

## 2020-11-20 DIAGNOSIS — I10 Essential (primary) hypertension: Secondary | ICD-10-CM | POA: Insufficient documentation

## 2020-11-20 LAB — ECHOCARDIOGRAM COMPLETE
AR max vel: 3.42 cm2
AV Area VTI: 3.94 cm2
AV Area mean vel: 3.39 cm2
AV Mean grad: 2 mmHg
AV Peak grad: 4.2 mmHg
Ao pk vel: 1.03 m/s
Area-P 1/2: 5.84 cm2
MV VTI: 3.75 cm2
S' Lateral: 2.1 cm

## 2020-11-20 NOTE — Progress Notes (Signed)
*  PRELIMINARY RESULTS* Echocardiogram 2D Echocardiogram has been performed.  Mallory Pearson 11/20/2020, 10:44 AM

## 2020-11-27 ENCOUNTER — Other Ambulatory Visit: Payer: Self-pay | Admitting: Physician Assistant

## 2020-11-27 DIAGNOSIS — I10 Essential (primary) hypertension: Secondary | ICD-10-CM | POA: Diagnosis not present

## 2020-11-27 DIAGNOSIS — R000 Tachycardia, unspecified: Secondary | ICD-10-CM | POA: Diagnosis not present

## 2020-12-09 ENCOUNTER — Ambulatory Visit: Payer: 59 | Admitting: Gastroenterology

## 2020-12-16 ENCOUNTER — Other Ambulatory Visit: Payer: Self-pay

## 2020-12-16 ENCOUNTER — Encounter: Payer: Self-pay | Admitting: Obstetrics and Gynecology

## 2020-12-16 ENCOUNTER — Ambulatory Visit (INDEPENDENT_AMBULATORY_CARE_PROVIDER_SITE_OTHER): Payer: 59 | Admitting: Obstetrics and Gynecology

## 2020-12-16 VITALS — BP 126/84 | HR 90 | Ht 67.0 in | Wt 220.0 lb

## 2020-12-16 DIAGNOSIS — Z6834 Body mass index (BMI) 34.0-34.9, adult: Secondary | ICD-10-CM

## 2020-12-16 DIAGNOSIS — E669 Obesity, unspecified: Secondary | ICD-10-CM

## 2020-12-16 NOTE — Progress Notes (Signed)
Gynecology Office Visit  Chief Complaint:  Chief Complaint  Patient presents with  . Follow-up    Weight loss - no concerns. RM 5    History of Present Illness: Patientis a 35 y.o. [id number] female, who presents for the evaluation of the desire to lose weight. She has lost 3 pounds 1 months, total weight loss 25lbs in 5 months  The patient states the following symptoms since starting her weight loss therapy: appetite suppression, energy, and weight loss.  The patient also reports no other ill effects. The patient specifically denies heart palpitations, anxiety, and insomnia.    Review of Systems: 10 point review of systems negative unless otherwise noted in HPI  Past Medical History:  Past Medical History:  Diagnosis Date  . Allergy   . Family history of ovarian cancer    12/21 cancer genetic testing letter sent  . GERD (gastroesophageal reflux disease)   . Gestational diabetes   . History of chicken pox   . Hypertension   . Subglottic stenosis   . Tachycardia     Past Surgical History:  Past Surgical History:  Procedure Laterality Date  . CESAREAN SECTION  11/26/11  . CESAREAN SECTION N/A 04/01/2016   Procedure: CESAREAN SECTION;  Surgeon: Gae Dry, MD;  Location: [facility] ORS;  Service: Obstetrics;  Laterality: N/A;  Time of birth 18:50 Sex: Female Weight: 7 lbs, 13 oz   . CESAREAN SECTION N/A 10/27/2017   Procedure: CESAREAN SECTION;  Surgeon: Malachy Mood, MD;  Location: [facility] ORS;  Service: Obstetrics;  Laterality: N/A;  . KNEE SURGERY Left 2005  . LARYNX SURGERY  2018  . TONSILLECTOMY      Gynecologic History: No LMP recorded. (Menstrual status: IUD).  Obstetric History: [id number]  Family History:  Family History  Problem Relation Age of Onset  . Ovarian cancer Maternal Grandmother   . Breast cancer Neg Hx     Social History:  Social History   Socioeconomic History  . Marital status: Legally Separated    Spouse name: Not on file  . Number of  children: Not on file  . Years of education: Not on file  . Highest education level: Not on file  Occupational History  . Occupation: cna    Employer: [employer]  Tobacco Use  . Smoking status: Never Smoker  . Smokeless tobacco: Never Used  Vaping Use  . Vaping Use: Never used  Substance and Sexual Activity  . Alcohol use: No    Alcohol/week: 0.0 standard drinks  . Drug use: No  . Sexual activity: Not Currently    Birth control/protection: I.U.D.  Other Topics Concern  . Not on file  Social History Narrative   Regular exercise--yes   Social Determinants of Health   Financial Resource Strain: Not on file  Food Insecurity: Not on file  Transportation Needs: Not on file  Physical Activity: Not on file  Stress: Not on file  Social Connections: Not on file  Intimate Partner Violence: Not on file    Allergies:  Allergies  Allergen Reactions  . Sulfa Antibiotics Hives  . Sulfasalazine Hives  . Sulfonamide Derivatives Hives    Medications: Prior to Admission medications   Medication Sig Start Date End Date Taking? Authorizing Provider  diltiazem (CARDIZEM CD) 120 MG 24 hr capsule TAKE 1 CAPSULE (120 MG TOTAL) BY MOUTH ONCE DAILY 07/22/20 07/22/21  Clabe Seal, PA-C  diltiazem (CARDIZEM CD) 180 MG 24 hr capsule TAKE 1 CAPSULE (180 MG TOTAL) BY  MOUTH ONCE DAILY 11/06/20 11/06/21  Clabe Seal, PA-C  diltiazem [facility]) 120 MG 24 hr capsule Take by mouth. 07/22/20 07/22/21  [provider]  metoprolol succinate (TOPROL-XL) 25 MG 24 hr tablet Take by mouth. 37.5 mg Patient not taking: Reported on 08/15/2020 12/31/19 07/02/21  [provider]  metoprolol succinate (TOPROL-XL) 25 MG 24 hr tablet TAKE 1 TABLET BY MOUTH ONCE DAILY 11/27/20 11/27/21  Clabe Seal, PA-C  Multiple Vitamins-Minerals (HAIR SKIN AND NAILS FORMULA PO) Take by mouth.    [provider]  omeprazole (PRILOSEC) 20 MG capsule  06/30/20   [provider]  omeprazole (PRILOSEC) 20 MG  capsule TAKE 1 CAPSULE BY MOUTH DAILY 10/22/20 10/22/21  Buckmire, Audree Camel, MD  omeprazole (PRILOSEC) 20 MG capsule TAKE 1 TABLET (20 MG TOTAL) BY MOUTH DAILY 03/27/20 03/27/21  Buckmire, Audree Camel, MD  phentermine (ADIPEX-P) 37.5 MG tablet TAKE 1 TABLET (37.5 MG TOTAL) BY MOUTH DAILY BEFORE BREAKFAST. 11/14/20 05/13/21  Malachy Mood, MD    Physical Exam Blood pressure 126/84, height 5\' 7"  (1.702 m), weight 220 lb (99.8 kg). Wt Readings from Last 3 Encounters:  12/16/20 220 lb (99.8 kg)  11/14/20 223 lb (101.2 kg)  10/16/20 231 lb (104.8 kg)  Body mass index is 34.46 kg/m.   General: NAD HEENT: normocephalic, anicteric Thyroid: no enlargement Pulmonary: no increased work of breathing Neurologic: Grossly intact Psychiatric: mood appropriate, affect full  Assessment: 35 y.o. [id number] medical weight loss follow up  Plan: Problem List Items Addressed This Visit   None   Visit Diagnoses    Class 1 obesity without serious comorbidity with body mass index (BMI) of 34.0 to 34.9 in adult, unspecified obesity type    -  Primary      1) 1500 Calorie ADA Diet  2) Patient education given regarding appropriate lifestyle changes for weight loss including: regular physical activity, healthy coping strategies, caloric restriction and healthy eating patterns.  3) Discontinue phentermine.  Discussed 3 months off and restating vs switching to Saxenda/[place].  Patient opts to take 1 month off see how she does and then make decision on how to proceed   5) 15 minutes face-to-face; with counseling/coordination of care > 50 percent of visit related to obesity and ongoing management/treatment   6)  Return in about 4 weeks (around 01/13/2021) for weight loss follow up.    Malachy Mood, MD, [facility], [facility] 12/16/2020, 10:25 AM

## 2021-01-01 ENCOUNTER — Other Ambulatory Visit: Payer: Self-pay | Admitting: Obstetrics and Gynecology

## 2021-01-01 ENCOUNTER — Other Ambulatory Visit: Payer: Self-pay

## 2021-01-01 MED ORDER — NITROFURANTOIN MONOHYD MACRO 100 MG PO CAPS
100.0000 mg | ORAL_CAPSULE | Freq: Two times a day (BID) | ORAL | 0 refills | Status: AC
  Filled 2021-01-01: qty 10, 5d supply, fill #0

## 2021-01-02 ENCOUNTER — Other Ambulatory Visit: Payer: Self-pay

## 2021-01-02 MED FILL — Omeprazole Cap Delayed Release 20 MG: ORAL | 28 days supply | Qty: 28 | Fill #0 | Status: AC

## 2021-01-19 ENCOUNTER — Ambulatory Visit: Payer: 59 | Admitting: Obstetrics and Gynecology

## 2021-01-22 ENCOUNTER — Encounter: Payer: Self-pay | Admitting: Obstetrics and Gynecology

## 2021-01-22 ENCOUNTER — Ambulatory Visit (INDEPENDENT_AMBULATORY_CARE_PROVIDER_SITE_OTHER): Payer: 59 | Admitting: Obstetrics and Gynecology

## 2021-01-22 ENCOUNTER — Other Ambulatory Visit: Payer: Self-pay

## 2021-01-22 VITALS — BP 145/92 | Ht 67.0 in | Wt 223.0 lb

## 2021-01-22 DIAGNOSIS — Z6834 Body mass index (BMI) 34.0-34.9, adult: Secondary | ICD-10-CM | POA: Diagnosis not present

## 2021-01-22 DIAGNOSIS — E669 Obesity, unspecified: Secondary | ICD-10-CM | POA: Diagnosis not present

## 2021-01-22 MED ORDER — UNIFINE PENTIPS 31G X 6 MM MISC
1.0000 [IU] | Freq: Every day | 3 refills | Status: DC
  Filled 2021-01-22 – 2021-02-09 (×3): qty 100, 90d supply, fill #0

## 2021-01-22 MED ORDER — SAXENDA 18 MG/3ML ~~LOC~~ SOPN
PEN_INJECTOR | SUBCUTANEOUS | 0 refills | Status: AC
  Filled 2021-01-22: qty 30, 60d supply, fill #0
  Filled 2021-01-27: qty 30, 30d supply, fill #0
  Filled 2021-02-03 – 2021-02-09 (×2): qty 15, 30d supply, fill #0

## 2021-01-22 NOTE — Progress Notes (Signed)
Gynecology Office Visit  Chief Complaint:  Chief Complaint  Patient presents with  . Weight Check    F/U medication - no concerns. RM 2    History of Present Illness: Patientis a 35 y.o. [phone] female, who presents for the evaluation of the desire to lose weight. She has gained 3 lbs since her last phentermine course. She is still down 22lbs to date from the start of her last phentermine course.  She did well with phentermine but started noting decreased weight loss prompting a drug holiday.  Since discontinuing phentermine patient has not significant increase in appetite and interested in trial of Saxenda.  Review of Systems: 10 point review of systems negative unless otherwise noted in HPI  Past Medical History:  Past Medical History:  Diagnosis Date  . Allergy   . Family history of ovarian cancer    12/21 cancer genetic testing letter sent  . GERD (gastroesophageal reflux disease)   . Gestational diabetes   . History of chicken pox   . Hypertension   . Subglottic stenosis   . Tachycardia     Past Surgical History:  Past Surgical History:  Procedure Laterality Date  . CESAREAN SECTION  11/26/11  . CESAREAN SECTION N/A 04/01/2016   Procedure: CESAREAN SECTION;  Surgeon: Gae Dry, MD;  Location: [facility] ORS;  Service: Obstetrics;  Laterality: N/A;  Time of birth 18:50 Sex: Female Weight: 7 lbs, 13 oz   . CESAREAN SECTION N/A 10/27/2017   Procedure: CESAREAN SECTION;  Surgeon: Malachy Mood, MD;  Location: [facility] ORS;  Service: Obstetrics;  Laterality: N/A;  . KNEE SURGERY Left 2005  . LARYNX SURGERY  2018  . TONSILLECTOMY      Gynecologic History: No LMP recorded. (Menstrual status: IUD).  Obstetric History: [id number]  Family History:  Family History  Problem Relation Age of Onset  . Ovarian cancer Maternal Grandmother   . Breast cancer Neg Hx     Social History:  Social History   Socioeconomic History  . Marital status: Legally Separated    Spouse  name: Not on file  . Number of children: Not on file  . Years of education: Not on file  . Highest education level: Not on file  Occupational History  . Occupation: cna    Employer: [employer]  Tobacco Use  . Smoking status: Never Smoker  . Smokeless tobacco: Never Used  Vaping Use  . Vaping Use: Never used  Substance and Sexual Activity  . Alcohol use: No    Alcohol/week: 0.0 standard drinks  . Drug use: No  . Sexual activity: Not Currently    Birth control/protection: I.U.D.  Other Topics Concern  . Not on file  Social History Narrative   Regular exercise--yes   Social Determinants of Health   Financial Resource Strain: Not on file  Food Insecurity: Not on file  Transportation Needs: Not on file  Physical Activity: Not on file  Stress: Not on file  Social Connections: Not on file  Intimate Partner Violence: Not on file    Allergies:  Allergies  Allergen Reactions  . Sulfa Antibiotics Hives  . Sulfasalazine Hives  . Sulfonamide Derivatives Hives    Medications: Prior to Admission medications   Medication Sig Start Date End Date Taking? Authorizing Provider  diltiazem (CARDIZEM CD) 180 MG 24 hr capsule TAKE 1 CAPSULE (180 MG TOTAL) BY MOUTH ONCE DAILY 11/06/20 11/06/21 Yes Clabe Seal, PA-C  phentermine (ADIPEX-P) 37.5 MG tablet TAKE 1 TABLET (37.5  MG TOTAL) BY MOUTH DAILY BEFORE BREAKFAST. 11/14/20 05/13/21 Yes Malachy Mood, MD  diltiazem (CARDIZEM CD) 120 MG 24 hr capsule TAKE 1 CAPSULE (120 MG TOTAL) BY MOUTH ONCE DAILY 07/22/20 07/22/21  Clabe Seal, PA-C  diltiazem [facility]) 120 MG 24 hr capsule Take by mouth. 07/22/20 07/22/21  [provider]  metoprolol succinate (TOPROL-XL) 25 MG 24 hr tablet Take by mouth. 37.5 mg Patient not taking: Reported on 08/15/2020 12/31/19 07/02/21  [provider]  metoprolol succinate (TOPROL-XL) 25 MG 24 hr tablet TAKE 1 TABLET BY MOUTH ONCE DAILY 11/27/20 11/27/21  Clabe Seal, PA-C  Multiple Vitamins-Minerals  (HAIR SKIN AND NAILS FORMULA PO) Take by mouth.    [provider]  omeprazole (PRILOSEC) 20 MG capsule  06/30/20   [provider]  omeprazole (PRILOSEC) 20 MG capsule TAKE 1 CAPSULE BY MOUTH DAILY 10/22/20 10/22/21  Buckmire, Audree Camel, MD  omeprazole (PRILOSEC) 20 MG capsule TAKE 1 TABLET (20 MG TOTAL) BY MOUTH DAILY 03/27/20 03/27/21  Buckmire, Audree Camel, MD    Physical Exam Blood pressure (!) 145/92, height 5\' 7"  (1.702 m), weight 223 lb (101.2 kg). Wt Readings from Last 3 Encounters:  01/22/21 223 lb (101.2 kg)  12/16/20 220 lb (99.8 kg)  11/14/20 223 lb (101.2 kg)    General: NAD HEENT: normocephalic, anicteric Thyroid: no enlargement Pulmonary: no increased work of breathing Neurologic: Grossly intact Psychiatric: mood appropriate, affect full  Assessment: 35 y.o. [id number] No problem-specific Assessment & Plan notes found for this encounter.   Plan: Problem List Items Addressed This Visit   None   Visit Diagnoses    Class 1 obesity without serious comorbidity with body mass index (BMI) of 34.0 to 34.9 in adult, unspecified obesity type    -  Primary   Relevant Medications   Liraglutide -Weight Management (SAXENDA) 18 MG/3ML SOPN   Other Relevant Orders   Referral to Nutrition and Diabetes Services      1) 1500 Calorie ADA Diet  2) Patient education given regarding appropriate lifestyle changes for weight loss including: regular physical activity, healthy coping strategies, caloric restriction and healthy eating patterns.  3) Gained 3 lbs since coming of phentermine is interested in trial of Saxenda.  Rx written as well as lifestyles referral.  4) Patient to take medication, with the benefits of appetite suppression and metabolism boost d/w pt, along with the side effects and risk factors of long term use that will be avoided with our use of short bursts of therapy. Rx Saxenda provided.    5) 15 minutes face-to-face; with counseling/coordination of care  > 50 percent of visit related to obesity and ongoing management/treatment   6)  Return in about 3 months (around 04/24/2021) for medication follow up.    Malachy Mood, MD, [facility], [facility] 01/22/2021, 10:10 AM

## 2021-01-27 ENCOUNTER — Other Ambulatory Visit: Payer: Self-pay

## 2021-02-03 ENCOUNTER — Other Ambulatory Visit: Payer: Self-pay

## 2021-02-09 ENCOUNTER — Other Ambulatory Visit: Payer: Self-pay

## 2021-02-09 NOTE — Telephone Encounter (Signed)
Called and notified patient of approval for Saxenda. drl

## 2021-02-10 ENCOUNTER — Other Ambulatory Visit: Payer: Self-pay

## 2021-02-10 MED FILL — Diltiazem HCl Coated Beads Cap ER 24HR 180 MG: ORAL | 30 days supply | Qty: 30 | Fill #0 | Status: AC

## 2021-03-15 MED FILL — Diltiazem HCl Coated Beads Cap ER 24HR 180 MG: ORAL | 30 days supply | Qty: 30 | Fill #1 | Status: AC

## 2021-03-15 MED FILL — Omeprazole Cap Delayed Release 20 MG: ORAL | 28 days supply | Qty: 28 | Fill #1 | Status: AC

## 2021-03-16 ENCOUNTER — Other Ambulatory Visit: Payer: Self-pay

## 2021-04-06 ENCOUNTER — Ambulatory Visit: Payer: 59 | Admitting: Dietician

## 2021-04-27 ENCOUNTER — Other Ambulatory Visit: Payer: Self-pay

## 2021-04-27 MED FILL — Diltiazem HCl Coated Beads Cap ER 24HR 180 MG: ORAL | 30 days supply | Qty: 30 | Fill #2 | Status: AC

## 2021-04-28 ENCOUNTER — Ambulatory Visit: Payer: 59 | Admitting: Obstetrics and Gynecology

## 2021-05-14 ENCOUNTER — Ambulatory Visit: Payer: 59 | Admitting: Dietician

## 2021-05-25 ENCOUNTER — Other Ambulatory Visit: Payer: Self-pay

## 2021-05-25 ENCOUNTER — Ambulatory Visit (INDEPENDENT_AMBULATORY_CARE_PROVIDER_SITE_OTHER): Payer: 59 | Admitting: Obstetrics and Gynecology

## 2021-05-25 ENCOUNTER — Encounter: Payer: Self-pay | Admitting: Obstetrics and Gynecology

## 2021-05-25 VITALS — BP 132/84 | Ht 67.0 in | Wt 223.0 lb

## 2021-05-25 DIAGNOSIS — Z6834 Body mass index (BMI) 34.0-34.9, adult: Secondary | ICD-10-CM

## 2021-05-25 DIAGNOSIS — E669 Obesity, unspecified: Secondary | ICD-10-CM

## 2021-05-25 MED ORDER — PHENTERMINE HCL 37.5 MG PO TABS
ORAL_TABLET | Freq: Every day | ORAL | 0 refills | Status: DC
  Filled 2021-05-25: qty 30, 30d supply, fill #0

## 2021-05-25 NOTE — Progress Notes (Signed)
Gynecology Office Visit  Chief Complaint:  Chief Complaint  Patient presents with   Follow-up    Weight loss - no concerns. RM 4    History of Present Illness: Patientis a 35 y.o. [id number] female, who presents for the evaluation of the desire to lose weight. She has lost 0 pounds 3 months. The patient states the following symptoms since starting her weight loss therapy: appetite suppression, energy, and weight loss.  The patient also reports no other ill effects. The patient specifically denies heart palpitations, anxiety, and insomnia.   She self discontinued the Saxenda secondary to nausea.  Has been able to maintain off of all agents in the past 3 months.  Review of Systems: 10 point review of systems negative unless otherwise noted in HPI  Past Medical History:  Past Medical History:  Diagnosis Date   Allergy    Family history of ovarian cancer    12/21 cancer genetic testing letter sent   GERD (gastroesophageal reflux disease)    Gestational diabetes    History of chicken pox    Hypertension    Subglottic stenosis    Tachycardia     Past Surgical History:  Past Surgical History:  Procedure Laterality Date   CESAREAN SECTION  11/26/11   CESAREAN SECTION N/A 04/01/2016   Procedure: CESAREAN SECTION;  Surgeon: Gae Dry, MD;  Location: [facility] ORS;  Service: Obstetrics;  Laterality: N/A;  Time of birth 18:[birth year] Sex: Female Weight: 7 lbs, 13 oz    CESAREAN SECTION N/A 10/27/2017   Procedure: CESAREAN SECTION;  Surgeon: Malachy Mood, MD;  Location: [facility] ORS;  Service: Obstetrics;  Laterality: N/A;   KNEE SURGERY Left 2005   LARYNX SURGERY  2018   TONSILLECTOMY      Gynecologic History: No LMP recorded. (Menstrual status: IUD).  Obstetric History: [id number]  Family History:  Family History  Problem Relation Age of Onset   Ovarian cancer Maternal Grandmother    Breast cancer Neg Hx     Social History:  Social History   Socioeconomic History   Marital  status: Legally Separated    Spouse name: Not on file   Number of children: Not on file   Years of education: Not on file   Highest education level: Not on file  Occupational History   Occupation: cna    Employer: [employer]  Tobacco Use   Smoking status: Never   Smokeless tobacco: Never  Vaping Use   Vaping Use: Never used  Substance and Sexual Activity   Alcohol use: No    Alcohol/week: 0.0 standard drinks   Drug use: No   Sexual activity: Not Currently    Birth control/protection: I.U.D.  Other Topics Concern   Not on file  Social History Narrative   Regular exercise--yes   Social Determinants of Health   Financial Resource Strain: Not on file  Food Insecurity: Not on file  Transportation Needs: Not on file  Physical Activity: Not on file  Stress: Not on file  Social Connections: Not on file  Intimate Partner Violence: Not on file    Allergies:  Allergies  Allergen Reactions   Sulfa Antibiotics Hives   Sulfasalazine Hives   Sulfonamide Derivatives Hives    Medications: Prior to Admission medications   Medication Sig Start Date End Date Taking? Authorizing Provider  diltiazem (CARDIZEM CD) 120 MG 24 hr capsule TAKE 1 CAPSULE (120 MG TOTAL) BY MOUTH ONCE DAILY 07/22/20 07/22/21  Clabe Seal, PA-C  diltiazem (CARDIZEM  CD) 180 MG 24 hr capsule TAKE 1 CAPSULE (180 MG TOTAL) BY MOUTH ONCE DAILY 11/06/20 11/06/21  Clabe Seal, PA-C  diltiazem [facility]) 120 MG 24 hr capsule Take by mouth. 07/22/20 07/22/21  [provider]  Insulin Pen Needle (UNIFINE PENTIPS) 31G X 6 MM MISC Use as directed once daily with Saxenda 01/22/21   Malachy Mood, MD  metoprolol succinate (TOPROL-XL) 25 MG 24 hr tablet Take by mouth. 37.5 mg Patient not taking: Reported on 08/15/2020 12/31/19 07/02/21  [provider]  metoprolol succinate (TOPROL-XL) 25 MG 24 hr tablet TAKE 1 TABLET BY MOUTH ONCE DAILY 11/27/20 11/27/21  Clabe Seal, PA-C  Multiple Vitamins-Minerals (HAIR SKIN  AND NAILS FORMULA PO) Take by mouth.    [provider]  omeprazole (PRILOSEC) 20 MG capsule  06/30/20   [provider]  omeprazole (PRILOSEC) 20 MG capsule TAKE 1 CAPSULE BY MOUTH DAILY 10/22/20 10/22/21  Buckmire, Audree Camel, MD  omeprazole (PRILOSEC) 20 MG capsule TAKE 1 TABLET (20 MG TOTAL) BY MOUTH DAILY 03/27/20 03/27/21  Buckmire, Audree Camel, MD  phentermine (ADIPEX-P) 37.5 MG tablet TAKE 1 TABLET (37.5 MG TOTAL) BY MOUTH DAILY BEFORE BREAKFAST. 11/14/20 05/13/21  Malachy Mood, MD    Physical Exam Blood pressure 132/84, height '5\' 7"'$  (1.702 m), weight 223 lb (101.2 kg). Wt Readings from Last 3 Encounters:  05/25/21 223 lb (101.2 kg)  01/22/21 223 lb (101.2 kg)  12/16/20 220 lb (99.8 kg)  Body mass index is 34.93 kg/m.   General: NAD HEENT: normocephalic, anicteric Thyroid: no enlargement Pulmonary: no increased work of breathing Neurologic: Grossly intact Psychiatric: mood appropriate, affect full  Assessment: 35 y.o. [id number] No problem-specific Assessment & Plan notes found for this encounter.   Plan: Problem List Items Addressed This Visit   None Visit Diagnoses     Class 1 obesity without serious comorbidity with body mass index (BMI) of 34.0 to 34.9 in adult, unspecified obesity type    -  Primary       1) 1500 Calorie ADA Diet  2) Patient education given regarding appropriate lifestyle changes for weight loss including: regular physical activity, healthy coping strategies, caloric restriction and healthy eating patterns.  3) Patient will be started on weight loss medication. The risks and benefits and side effects of medication, such as Adipex (Phenteramine) ,  Tenuate (Diethylproprion),  Contrave (buproprion/naltrexone), Qsymia (phentermine/topiramate), and Saxenda (liraglutide) is discussed. The pros and cons of suppressing appetite and boosting metabolism is discussed. Risks of tolerence and addiction is discussed for selected agents discussed. Use  of medicine will ne short term, such as 3-4 months at a time followed by a period of time off of the medicine to avoid these risks and side effects for Adipex, Qsymia, and Tenuate discussed. Pt to call with any negative side effects and agrees to keep follow up appts.  4) Patient to take medication, with the benefits of appetite suppression and metabolism boost d/w pt, along with the side effects and risk factors of long term use that will be avoided with our use of short bursts of therapy. Rx provided.   - Rx phentermine - Discuss tirzepitide as an option same class as Saxenda but weekly    5) 15 minutes face-to-face; with counseling/coordination of care > 50 percent of visit related to obesity and ongoing management/treatment   6) Return in about 4 weeks (around 06/22/2021) for medication follow up.    Malachy Mood, MD, [facility], [facility] 05/25/2021, 9:20 AM

## 2021-06-19 ENCOUNTER — Other Ambulatory Visit: Payer: Self-pay

## 2021-06-19 ENCOUNTER — Encounter: Payer: Self-pay | Admitting: Obstetrics and Gynecology

## 2021-06-19 ENCOUNTER — Ambulatory Visit (INDEPENDENT_AMBULATORY_CARE_PROVIDER_SITE_OTHER): Payer: 59 | Admitting: Obstetrics and Gynecology

## 2021-06-19 VITALS — BP 134/86 | Ht 67.0 in | Wt 214.0 lb

## 2021-06-19 DIAGNOSIS — E669 Obesity, unspecified: Secondary | ICD-10-CM | POA: Diagnosis not present

## 2021-06-19 DIAGNOSIS — Z6833 Body mass index (BMI) 33.0-33.9, adult: Secondary | ICD-10-CM

## 2021-06-19 MED ORDER — PHENTERMINE HCL 37.5 MG PO TABS
ORAL_TABLET | Freq: Every day | ORAL | 0 refills | Status: DC
  Filled 2021-06-19: qty 30, 30d supply, fill #0

## 2021-06-19 NOTE — Progress Notes (Signed)
Gynecology Office Visit  Chief Complaint:  Chief Complaint  Patient presents with   Follow-up    Weight loss - no concerns. RM 4    History of Present Illness: Patientis a 35 y.o. [id number] female, who presents for the evaluation of the desire to lose weight. She has lost 9 pounds 1 months. The patient states the following symptoms since starting her weight loss therapy: appetite suppression, energy, and weight loss.  The patient also reports no other ill effects. The patient specifically denies heart palpitations, anxiety, and insomnia.    Review of Systems: 10 point review of systems negative unless otherwise noted in HPI  Past Medical History:  Past Medical History:  Diagnosis Date   Allergy    Family history of ovarian cancer    12/21 cancer genetic testing letter sent   GERD (gastroesophageal reflux disease)    Gestational diabetes    History of chicken pox    Hypertension    Subglottic stenosis    Tachycardia     Past Surgical History:  Past Surgical History:  Procedure Laterality Date   CESAREAN SECTION  11/26/11   CESAREAN SECTION N/A 04/01/2016   Procedure: CESAREAN SECTION;  Surgeon: Gae Dry, MD;  Location: [facility] ORS;  Service: Obstetrics;  Laterality: N/A;  Time of birth 18:[birth year] Sex: Female Weight: 7 lbs, 13 oz    CESAREAN SECTION N/A 10/27/2017   Procedure: CESAREAN SECTION;  Surgeon: Malachy Mood, MD;  Location: [facility] OR[facility];  Service: Obstetrics;  Laterality: N/A;   KNEE SURGERY Left 2005   LARYNX SURGERY  2018   TONSILLECTOMY      Gynecologic History: No LMP recorded. (Menstrual status: IUD).  Obstetric History: [id number]  Family History:  Family History  Problem Relation Age of Onset   Ovarian cancer Maternal Grandmother    Breast cancer Neg Hx     Social History:  Social History   Socioeconomic History   Marital status: Legally Separated    Spouse name: Not on file   Number of children: Not on file   Years of education: Not on file    Highest education level: Not on file  Occupational History   Occupation: cna    Employer: [employer]  Tobacco Use   Smoking status: Never   Smokeless tobacco: Never  Vaping Use   Vaping Use: Never used  Substance and Sexual Activity   Alcohol use: No    Alcohol/week: 0.0 standard drinks   Drug use: No   Sexual activity: Not Currently    Birth control/protection: I.U.D.  Other Topics Concern   Not on file  Social History Narrative   Regular exercise--yes   Social Determinants of Health   Financial Resource Strain: Not on file  Food Insecurity: Not on file  Transportation Needs: Not on file  Physical Activity: Not on file  Stress: Not on file  Social Connections: Not on file  Intimate Partner Violence: Not on file    Allergies:  Allergies  Allergen Reactions   Sulfa Antibiotics Hives   Sulfasalazine Hives   Sulfonamide Derivatives Hives    Medications: Prior to Admission medications   Medication Sig Start Date End Date Taking? Authorizing Provider  phentermine (ADIPEX-P) 37.5 MG tablet TAKE 1 TABLET (37.5 MG TOTAL) BY MOUTH DAILY BEFORE BREAKFAST. 05/25/21 06/27/21 Yes Malachy Mood, MD  diltiazem (CARDIZEM CD) 120 MG 24 hr capsule TAKE 1 CAPSULE (120 MG TOTAL) BY MOUTH ONCE DAILY 07/22/20 07/22/21  Clabe Seal, PA-C  diltiazem (CARDIZEM  CD) 180 MG 24 hr capsule TAKE 1 CAPSULE (180 MG TOTAL) BY MOUTH ONCE DAILY 11/06/20 11/06/21  Clabe Seal, PA-C  diltiazem [facility]) 120 MG 24 hr capsule Take by mouth. 07/22/20 07/22/21  [provider]  Insulin Pen Needle (UNIFINE PENTIPS) 31G X 6 MM MISC Use as directed once daily with Saxenda 01/22/21   Malachy Mood, MD  metoprolol succinate (TOPROL-XL) 25 MG 24 hr tablet Take by mouth. 37.5 mg Patient not taking: Reported on 08/15/2020 12/31/19 07/02/21  [provider]  metoprolol succinate (TOPROL-XL) 25 MG 24 hr tablet TAKE 1 TABLET BY MOUTH ONCE DAILY 11/27/20 11/27/21  Clabe Seal, PA-C  Multiple  Vitamins-Minerals (HAIR SKIN AND NAILS FORMULA PO) Take by mouth.    [provider]  omeprazole (PRILOSEC) 20 MG capsule  06/30/20   [provider]  omeprazole (PRILOSEC) 20 MG capsule TAKE 1 CAPSULE BY MOUTH DAILY 10/22/20 10/22/21  Buckmire, Audree Camel, MD  omeprazole (PRILOSEC) 20 MG capsule TAKE 1 TABLET (20 MG TOTAL) BY MOUTH DAILY 03/27/20 03/27/21  Buckmire, Audree Camel, MD    Physical Exam Blood pressure 134/86, height 5\' 7"  (1.702 m), weight 214 lb (97.1 kg). Wt Readings from Last 3 Encounters:  06/19/21 214 lb (97.1 kg)  05/25/21 223 lb (101.2 kg)  01/22/21 223 lb (101.2 kg)  Body mass index is 33.52 kg/m.   General: NAD HEENT: normocephalic, anicteric Thyroid: no enlargement Pulmonary: no increased work of breathing Neurologic: Grossly intact Psychiatric: mood appropriate, affect full  Assessment: 35 y.o. [id number] weight loss follow up  Plan: Problem List Items Addressed This Visit   None Visit Diagnoses     Class 1 obesity without serious comorbidity with body mass index (BMI) of 33.0 to 33.9 in adult, unspecified obesity type    -  Primary       1) 1500 Calorie ADA Diet  2) Patient education given regarding appropriate lifestyle changes for weight loss including: regular physical activity, healthy coping strategies, caloric restriction and healthy eating patterns.  3) Patient will be started on weight loss medication. The risks and benefits and side effects of medication, such as Adipex (Phenteramine) ,  Tenuate (Diethylproprion), Belviq (lorcarsin), Contrave (buproprion/naltrexone), Qsymia (phentermine/topiramate), and Saxenda (liraglutide) is discussed. The pros and cons of suppressing appetite and boosting metabolism is discussed. Risks of tolerence and addiction is discussed for selected agents discussed. Use of medicine will ne short term, such as 3-4 months at a time followed by a period of time off of the medicine to avoid these risks and side  effects for Adipex, Qsymia, and Tenuate discussed. Pt to call with any negative side effects and agrees to keep follow up appts.  4) Patient to take medication, with the benefits of appetite suppression and metabolism boost d/w pt, along with the side effects and risk factors of long term use that will be avoided with our use of short bursts of therapy. Rx provided.    5) 15 minutes face-to-face; with counseling/coordination of care > 50 percent of visit related to obesity and ongoing management/treatment   6) Return in about 4 weeks (around 07/17/2021) for medication follow up.    Malachy Mood, MD, [facility], [facility] 06/19/2021, 8:16 AM

## 2021-06-22 ENCOUNTER — Ambulatory Visit: Payer: 59 | Admitting: Obstetrics and Gynecology

## 2021-07-28 ENCOUNTER — Other Ambulatory Visit: Payer: Self-pay

## 2021-07-28 ENCOUNTER — Encounter: Payer: Self-pay | Admitting: Obstetrics and Gynecology

## 2021-07-28 ENCOUNTER — Ambulatory Visit (INDEPENDENT_AMBULATORY_CARE_PROVIDER_SITE_OTHER): Payer: 59 | Admitting: Obstetrics and Gynecology

## 2021-07-28 VITALS — BP 136/82 | Ht 67.0 in | Wt 214.0 lb

## 2021-07-28 DIAGNOSIS — Z6833 Body mass index (BMI) 33.0-33.9, adult: Secondary | ICD-10-CM | POA: Diagnosis not present

## 2021-07-28 DIAGNOSIS — E669 Obesity, unspecified: Secondary | ICD-10-CM | POA: Diagnosis not present

## 2021-07-28 MED ORDER — PHENTERMINE HCL 37.5 MG PO TABS
ORAL_TABLET | Freq: Every day | ORAL | 0 refills | Status: DC
  Filled 2021-07-28: qty 30, 30d supply, fill #0

## 2021-07-28 NOTE — Progress Notes (Signed)
Gynecology Office Visit  Chief Complaint: No chief complaint on file.   History of Present Illness: Patientis a 35 y.o. [phone] female, who presents for the evaluation of the desire to lose weight. She has lost 0 pounds 1 months. The patient states the following symptoms since starting her weight loss therapy: appetite suppression, energy, and weight loss.  The patient also reports no other ill effects. The patient specifically denies heart palpitations, anxiety, and insomnia.   Has not been taking consistently in the past month secondary to increased stress with separation.  Review of Systems: 10 point review of systems negative unless otherwise noted in HPI  Past Medical History:  Past Medical History:  Diagnosis Date   Allergy    Family history of ovarian cancer    12/21 cancer genetic testing letter sent   GERD (gastroesophageal reflux disease)    Gestational diabetes    History of chicken pox    Hypertension    Subglottic stenosis    Tachycardia     Past Surgical History:  Past Surgical History:  Procedure Laterality Date   CESAREAN SECTION  11/26/11   CESAREAN SECTION N/A 04/01/2016   Procedure: CESAREAN SECTION;  Surgeon: Gae Dry, MD;  Location: [facility] ORS;  Service: Obstetrics;  Laterality: N/A;  Time of birth 18:[birth year] Sex: Female Weight: 7 lbs, 13 oz    CESAREAN SECTION N/A 10/27/2017   Procedure: CESAREAN SECTION;  Surgeon: Malachy Mood, MD;  Location: [facility] ORS;  Service: Obstetrics;  Laterality: N/A;   KNEE SURGERY Left 2005   LARYNX SURGERY  2018   TONSILLECTOMY      Gynecologic History: No LMP recorded. (Menstrual status: IUD).  Obstetric History: [id number]  Family History:  Family History  Problem Relation Age of Onset   Ovarian cancer Maternal Grandmother    Breast cancer Neg Hx     Social History:  Social History   Socioeconomic History   Marital status: Legally Separated    Spouse name: Not on file   Number of children: Not on file    Years of education: Not on file   Highest education level: Not on file  Occupational History   Occupation: cna    Employer: [employer]  Tobacco Use   Smoking status: Never   Smokeless tobacco: Never  Vaping Use   Vaping Use: Never used  Substance and Sexual Activity   Alcohol use: No    Alcohol/week: 0.0 standard drinks   Drug use: No   Sexual activity: Not Currently    Birth control/protection: I.U.D.  Other Topics Concern   Not on file  Social History Narrative   Regular exercise--yes   Social Determinants of Health   Financial Resource Strain: Not on file  Food Insecurity: Not on file  Transportation Needs: Not on file  Physical Activity: Not on file  Stress: Not on file  Social Connections: Not on file  Intimate Partner Violence: Not on file    Allergies:  Allergies  Allergen Reactions   Sulfa Antibiotics Hives   Sulfasalazine Hives   Sulfonamide Derivatives Hives    Medications: Prior to Admission medications   Medication Sig Start Date End Date Taking? Authorizing Provider  Multiple Vitamins-Minerals (HAIR SKIN AND NAILS FORMULA PO) Take by mouth.    [provider]  omeprazole (PRILOSEC) 20 MG capsule TAKE 1 TABLET (20 MG TOTAL) BY MOUTH DAILY 03/27/20 03/27/21  Buckmire, Audree Camel, MD  phentermine (ADIPEX-P) 37.5 MG tablet TAKE 1 TABLET (37.5 MG TOTAL)  BY MOUTH DAILY BEFORE BREAKFAST. 06/19/21 07/23/21  Malachy Mood, MD    Physical Exam Blood pressure 136/82, height 5\' 7"  (1.702 m), weight 214 lb (97.1 kg). Wt Readings from Last 3 Encounters:  07/28/21 214 lb (97.1 kg)  06/19/21 214 lb (97.1 kg)  05/25/21 223 lb (101.2 kg)  Body mass index is 33.52 kg/m.   General: NAD HEENT: normocephalic, anicteric Thyroid: no enlargement Pulmonary: no increased work of breathing Neurologic: Grossly intact Psychiatric: mood appropriate, affect full  Assessment: 35 y.o. [id number] medication follow up medical weight loss   Plan: Problem List  Items Addressed This Visit   None Visit Diagnoses     Class 1 obesity without serious comorbidity with body mass index (BMI) of 33.0 to 33.9 in adult, unspecified obesity type    -  Primary   Relevant Medications   phentermine (ADIPEX-P) 37.5 MG tablet       1) 1500 Calorie ADA Diet  2) Patient education given regarding appropriate lifestyle changes for weight loss including: regular physical activity, healthy coping strategies, caloric restriction and healthy eating patterns.  3) Patient will be started on weight loss medication. The risks and benefits and side effects of medication, such as Adipex (Phenteramine) ,  Tenuate (Diethylproprion), Belviq (lorcarsin), Contrave (buproprion/naltrexone), Qsymia (phentermine/topiramate), and Saxenda (liraglutide) is discussed. The pros and cons of suppressing appetite and boosting metabolism is discussed. Risks of tolerence and addiction is discussed for selected agents discussed. Use of medicine will ne short term, such as 3-4 months at a time followed by a period of time off of the medicine to avoid these risks and side effects for Adipex, Qsymia, and Tenuate discussed. Pt to call with any negative side effects and agrees to keep follow up appts.  4) Patient to take medication, with the benefits of appetite suppression and metabolism boost d/w pt, along with the side effects and risk factors of long term use that will be avoided with our use of short bursts of therapy. Rx provided.   - continue phentermine  5) 15 minutes face-to-face; with counseling/coordination of care > 50 percent of visit related to obesity and ongoing management/treatment   6) Return in about 4 weeks (around 08/25/2021) for medicaton follow up.    Malachy Mood, MD, [facility], [place] 07/28/2021, 8:15 AM  '

## 2021-08-10 NOTE — Telephone Encounter (Signed)
Mirena rcvd/charged 01/25/2019

## 2021-08-13 ENCOUNTER — Other Ambulatory Visit: Payer: Self-pay | Admitting: Obstetrics and Gynecology

## 2021-08-13 MED ORDER — NITROFURANTOIN MONOHYD MACRO 100 MG PO CAPS
100.0000 mg | ORAL_CAPSULE | Freq: Two times a day (BID) | ORAL | 0 refills | Status: AC
  Filled 2021-08-13: qty 10, 5d supply, fill #0

## 2021-08-13 NOTE — Progress Notes (Signed)
Urinary frequency empiric Rx macrobid called in

## 2021-08-14 ENCOUNTER — Other Ambulatory Visit: Payer: Self-pay

## 2021-08-24 ENCOUNTER — Other Ambulatory Visit (HOSPITAL_COMMUNITY)
Admission: RE | Admit: 2021-08-24 | Discharge: 2021-08-24 | Disposition: A | Discharge status: Home / Self Care | Payer: 59 | Source: Ambulatory Visit | Attending: Obstetrics and Gynecology | Admitting: Obstetrics and Gynecology

## 2021-08-24 ENCOUNTER — Ambulatory Visit: Payer: 59 | Admitting: Obstetrics and Gynecology

## 2021-08-24 ENCOUNTER — Other Ambulatory Visit: Payer: Self-pay

## 2021-08-24 ENCOUNTER — Encounter: Payer: Self-pay | Admitting: Obstetrics and Gynecology

## 2021-08-24 ENCOUNTER — Ambulatory Visit (INDEPENDENT_AMBULATORY_CARE_PROVIDER_SITE_OTHER): Payer: 59 | Admitting: Obstetrics and Gynecology

## 2021-08-24 VITALS — BP 138/92 | HR 110 | Ht 67.0 in | Wt 217.0 lb

## 2021-08-24 DIAGNOSIS — Z6833 Body mass index (BMI) 33.0-33.9, adult: Secondary | ICD-10-CM

## 2021-08-24 DIAGNOSIS — Z01419 Encounter for gynecological examination (general) (routine) without abnormal findings: Secondary | ICD-10-CM | POA: Diagnosis not present

## 2021-08-24 DIAGNOSIS — Z1239 Encounter for other screening for malignant neoplasm of breast: Secondary | ICD-10-CM | POA: Diagnosis not present

## 2021-08-24 DIAGNOSIS — E669 Obesity, unspecified: Secondary | ICD-10-CM | POA: Diagnosis not present

## 2021-08-24 DIAGNOSIS — Z124 Encounter for screening for malignant neoplasm of cervix: Secondary | ICD-10-CM | POA: Diagnosis not present

## 2021-08-24 MED ORDER — PHENTERMINE HCL 37.5 MG PO TABS
ORAL_TABLET | Freq: Every day | ORAL | 0 refills | Status: DC
  Filled 2021-08-24: qty 30, 30d supply, fill #0

## 2021-08-24 NOTE — Progress Notes (Signed)
Gynecology Annual Exam   PCP: Hortencia Pilar, MD  Chief Complaint: No chief complaint on file.   History of Present Illness: Patient is a 35 y.o. Mallory Pearson presents for annual exam. The patient has no complaints today.   LMP: No LMP recorded. (Menstrual status: IUD). Amenorrhea on Mirena IUD  The patient is sexually active. She currently uses IUD for contraception. She denies dyspareunia.  The patient does perform self breast exams.  There is no notable family history of breast or ovarian cancer in her family.  The patient wears seatbelts: yes.   The patient has regular exercise: not asked.    The patient denies current symptoms of depression.    Patientis a 35 y.o. [id number] female, who presents for the evaluation of the desire to lose weight. She has gained 3 pounds in 1 months. The patient states the following symptoms since starting her weight loss therapy: appetite suppression, energy, and weight loss.  Some additional stressors as far as custody with her ex-husband so had not been taking as consistently. The patient also reports no other ill effects. The patient specifically denies heart palpitations, anxiety, and insomnia.    Review of Systems: Review of Systems  Constitutional:  Negative for chills and fever.  HENT:  Negative for congestion.   Respiratory:  Negative for cough and shortness of breath.   Cardiovascular:  Negative for chest pain and palpitations.  Gastrointestinal:  Negative for abdominal pain, constipation, diarrhea, heartburn, nausea and vomiting.  Genitourinary:  Negative for dysuria, frequency and urgency.  Skin:  Negative for itching and rash.  Neurological:  Negative for dizziness and headaches.  Endo/Heme/Allergies:  Negative for polydipsia.  Psychiatric/Behavioral:  Negative for depression.    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 [facility]     Past Surgical History:  Past Surgical History:  Procedure Laterality Date   CESAREAN SECTION  11/26/11   CESAREAN SECTION N/A 04/01/2016   Procedure: CESAREAN SECTION;  Surgeon: Gae Dry, MD;  Location: [facility] ORS;  Service: Obstetrics;  Laterality: N/A;  Time of birth 18:[birth year] Sex: Female Weight: 7 lbs, 13 oz    CESAREAN SECTION N/A 10/27/2017   Procedure: CESAREAN SECTION;  Surgeon: Malachy Mood, MD;  Location: [facility] ORS;  Service: Obstetrics;  Laterality: N/A;   KNEE SURGERY Left 2005   LARYNX SURGERY  2018   TONSILLECTOMY      Gynecologic History:  No LMP recorded. (Menstrual status: IUD). Contraception:01/25/2019 Mirena IUD Last Pap: Results were:07/18/2020 NIL and HR HPV negative   Obstetric History: [id number]  Family History:  Family History  Problem Relation Age of Onset   Ovarian cancer Maternal Grandmother    Breast cancer Neg Hx     Social History:  Social History   Socioeconomic History   Marital status: Legally Separated    Spouse name: Not on file   Number of children: Not on file   Years of education: Not on file   Highest education level: Not on file  Occupational History   Occupation: cna    Employer: [employer]  Tobacco Use   Smoking status: Never   Smokeless tobacco: Never  Vaping Use   Vaping Use: Never used  Substance and Sexual Activity   Alcohol use: No    Alcohol/week: 0.0 standard drinks  Drug use: No   Sexual activity: Not Currently    Birth control/protection: I.U.D.  Other Topics Concern   Not on file  Social History Narrative   Regular exercise--yes   Social Determinants of Health   Financial Resource Strain: Not on file  Food Insecurity: Not on file  Transportation Needs: Not on file  Physical Activity: Not on file  Stress: Not on file  Social Connections: Not on file  Intimate Partner Violence: Not on file    Allergies:  Allergies   Allergen Reactions   Sulfa Antibiotics Hives   Sulfasalazine Hives   Sulfonamide Derivatives Hives    Medications: Prior to Admission medications   Medication Sig Start Date End Date Taking? Authorizing Provider  Multiple Vitamins-Minerals (HAIR SKIN AND NAILS FORMULA PO) Take by mouth.    [provider]  omeprazole (PRILOSEC) 20 MG capsule TAKE 1 TABLET (20 MG TOTAL) BY MOUTH DAILY 03/27/20 03/27/21  Buckmire, Audree Camel, MD  phentermine (ADIPEX-P) 37.5 MG tablet TAKE 1 TABLET (37.5 MG TOTAL) BY MOUTH DAILY BEFORE BREAKFAST. 07/28/21 09/11/21  Malachy Mood, MD    Physical Exam Vitals: Blood pressure (!) 138/92, pulse (!) 110, height 5\' 7"  (1.702 m), weight 217 lb (98.4 kg). Body mass index is 33.99 kg/m.  General: NAD HEENT: normocephalic, anicteric Thyroid: no enlargement, no palpable nodules Pulmonary: No increased work of breathing, CTAB Cardiovascular: RRR, distal pulses 2+ Breast: Breast symmetrical, no tenderness, no palpable nodules or masses, no skin or nipple retraction present, no nipple discharge.  No axillary or supraclavicular lymphadenopathy. Abdomen: NABS, soft, non-tender, non-distended.  Umbilicus without lesions.  No hepatomegaly, splenomegaly or masses palpable. No evidence of hernia  Genitourinary:  External: Normal external female genitalia.  Normal urethral meatus, normal Bartholin's and Skene's glands.    Vagina: Normal vaginal mucosa, no evidence of prolapse.    Cervix: Grossly normal in appearance, no bleeding, IUD strings visualized  Uterus: Non-enlarged, mobile, normal contour.  No CMT  Adnexa: ovaries non-enlarged, no adnexal masses  Rectal: deferred  Lymphatic: no evidence of inguinal lymphadenopathy Extremities: no edema, erythema, or tenderness Neurologic: Grossly intact Psychiatric: mood appropriate, affect full  Female chaperone present for pelvic and breast  portions of the physical exam    Assessment: 35 y.o. [id number] routine  annual exam  Plan: Problem List Items Addressed This Visit   None Visit Diagnoses     Encounter for gynecological examination without abnormal finding    -  Primary   Breast screening       Class 1 obesity without serious comorbidity with body mass index (BMI) of 33.0 to 33.9 in adult, unspecified obesity type       Relevant Medications   phentermine (ADIPEX-P) 37.5 MG tablet   Screening for malignant neoplasm of cervix       Relevant Orders   Cytology - PAP       1) STI screening  was notoffered and therefore not obtained  2)  ASCCP guidelines and rational discussed.  Patient opts for every 3 years screening interval  3) Contraception - the patient is currently using  IUD.  She is happy with her current form of contraception and plans to continue  4) Routine healthcare maintenance including cholesterol, diabetes screening discussed managed by PCP  5) Medical weight loss - finish out phentermine course last month  6) Return in about 1 year (around 08/24/2022) for annual.   Malachy Mood, MD, [place], [facility] 08/24/2021, 3:59 PM

## 2021-08-26 ENCOUNTER — Encounter: Payer: Self-pay | Admitting: Obstetrics and Gynecology

## 2021-08-26 LAB — CYTOLOGY - PAP
Comment: NEGATIVE
Diagnosis: NEGATIVE
High risk HPV: NEGATIVE

## 2021-09-09 DIAGNOSIS — F4322 Adjustment disorder with anxiety: Secondary | ICD-10-CM | POA: Diagnosis not present

## 2021-09-16 DIAGNOSIS — F4322 Adjustment disorder with anxiety: Secondary | ICD-10-CM | POA: Diagnosis not present

## 2021-12-05 ENCOUNTER — Telehealth: Payer: 59 | Admitting: Family Medicine

## 2021-12-05 DIAGNOSIS — M545 Low back pain, unspecified: Secondary | ICD-10-CM

## 2021-12-05 MED ORDER — CYCLOBENZAPRINE HCL 10 MG PO TABS: 10.0000 mg | ORAL_TABLET | Freq: Three times a day (TID) | ORAL | 0 refills | Status: DC | PRN

## 2021-12-05 MED ORDER — NAPROXEN 500 MG PO TABS: 500.0000 mg | ORAL_TABLET | Freq: Two times a day (BID) | ORAL | 0 refills | Status: AC

## 2021-12-05 NOTE — Progress Notes (Signed)

## 2022-04-12 ENCOUNTER — Encounter: Payer: Self-pay | Admitting: Pharmacist

## 2022-04-12 ENCOUNTER — Other Ambulatory Visit: Payer: Self-pay

## 2022-04-12 MED ORDER — VILAZODONE HCL 10 MG PO TABS
ORAL_TABLET | ORAL | 0 refills | Status: DC
  Filled 2022-04-12: qty 60, 30d supply, fill #0

## 2022-04-13 ENCOUNTER — Other Ambulatory Visit: Payer: Self-pay

## 2022-04-15 ENCOUNTER — Other Ambulatory Visit: Payer: Self-pay

## 2023-03-31 ENCOUNTER — Telehealth: Payer: 59 | Admitting: Nurse Practitioner

## 2023-03-31 DIAGNOSIS — M545 Low back pain, unspecified: Secondary | ICD-10-CM | POA: Diagnosis not present

## 2023-03-31 MED ORDER — CYCLOBENZAPRINE HCL 10 MG PO TABS: 10.0000 mg | ORAL_TABLET | Freq: Three times a day (TID) | ORAL | 0 refills | Status: DC | PRN

## 2023-03-31 NOTE — Progress Notes (Signed)
We are sorry that you are not feeling well.  Here is how we plan to help!  Based on what you have shared with me it looks like you mostly have acute back pain.  Acute back pain is defined as musculoskeletal pain that can resolve in 1-3 weeks with conservative treatment.  I have prescribed  Flexeril 10 mg every eight hours as needed which is a muscle relaxer    Please keep in mind that muscle relaxer's can cause fatigue and should not be taken while at work or driving.  Back pain is very common.  The pain often gets better over time.  The cause of back pain is usually not dangerous.  Most people can learn to manage their back pain on their own.  Home Care Stay active.  Start with short walks on flat ground if you can.  Try to walk farther each day. Do not sit, drive or stand in one place for more than 30 minutes.  Do not stay in bed. Do not avoid exercise or work.  Activity can help your back heal faster. Be careful when you bend or lift an object.  Bend at your knees, keep the object close to you, and do not twist. Sleep on a firm mattress.  Lie on your side, and bend your knees.  If you lie on your back, put a pillow under your knees. Only take medicines as told by your doctor. Put ice on the injured area. Put ice in a plastic bag Place a towel between your skin and the bag Leave the ice on for 15-20 minutes, 3-4 times a day for the first 2-3 days. 210 After that, you can switch between ice and heat packs. Ask your doctor about back exercises or massage. Avoid feeling anxious or stressed.  Find good ways to deal with stress, such as exercise.  Get Help Right Way If: Your pain does not go away with rest or medicine. Your pain does not go away in 1 week. You have new problems. You do not feel well. The pain spreads into your legs. You cannot control when you poop (bowel movement) or pee (urinate) You feel sick to your stomach (nauseous) or throw up (vomit) You have belly (abdominal)  pain. You feel like you may pass out (faint). If you develop a fever.  Make Sure you: Understand these instructions. Will watch your condition Will get help right away if you are not doing well or get worse.  Your e-visit answers were reviewed by a board certified advanced clinical practitioner to complete your personal care plan.  Depending on the condition, your plan could have included both over the counter or prescription medications.  If there is a problem please reply  once you have received a response from your provider.  Your safety is important to [place].  If you have drug allergies check your prescription carefully.    You can use MyChart to ask questions about today's visit, request a non-urgent call back, or ask for a work or school excuse for 24 hours related to this e-Visit. If it has been greater than 24 hours you will need to follow up with your provider, or enter a new e-Visit to address those concerns.  You will get an e-mail in the next two days asking about your experience.  I hope that your e-visit has been valuable and will speed your recovery. Thank you for using e-visits.   Meds ordered this encounter  Medications   cyclobenzaprine (  FLEXERIL) 10 MG tablet    Sig: Take 1 tablet (10 mg total) by mouth 3 (three) times daily as needed for muscle spasms.    Dispense:  30 tablet    Refill:  0    I spent approximately 5 minutes reviewing the patient's history, current symptoms and coordinating their care today.

## 2023-04-11 ENCOUNTER — Ambulatory Visit
Admission: RE | Admit: 2023-04-11 | Discharge: 2023-04-11 | Disposition: A | Discharge status: Home / Self Care | Payer: 59 | Source: Ambulatory Visit | Attending: Physician Assistant | Admitting: Physician Assistant

## 2023-04-11 ENCOUNTER — Other Ambulatory Visit: Payer: Self-pay

## 2023-04-11 VITALS — BP 154/97 | HR 102 | Temp 98.8°F | Resp 18

## 2023-04-11 DIAGNOSIS — R509 Fever, unspecified: Secondary | ICD-10-CM | POA: Diagnosis present

## 2023-04-11 DIAGNOSIS — J069 Acute upper respiratory infection, unspecified: Secondary | ICD-10-CM | POA: Diagnosis not present

## 2023-04-11 DIAGNOSIS — U071 COVID-19: Secondary | ICD-10-CM | POA: Insufficient documentation

## 2023-04-11 DIAGNOSIS — R519 Headache, unspecified: Secondary | ICD-10-CM | POA: Diagnosis present

## 2023-04-11 DIAGNOSIS — R0981 Nasal congestion: Secondary | ICD-10-CM | POA: Diagnosis present

## 2023-04-11 LAB — POCT RAPID STREP A (OFFICE): Rapid Strep A Screen: NEGATIVE

## 2023-04-11 NOTE — ED Triage Notes (Signed)
Complains of congestion, headache, temp 99.3 temporal, negative covid test at home  Has taken ibuprofen and tylenol

## 2023-04-11 NOTE — ED Provider Notes (Signed)
Renaldo Fiddler    CSN: [id number] Arrival date & time: 04/11/23  1433      History   Chief Complaint No chief complaint on file.   HPI Mallory Pearson is a 37 y.o. female.   Here today for evaluation of congestion, headache, low-grade fever.  She reports some sore throat.  She has taken Tylenol and ibuprofen without resolution.  She states she took a COVID test at home that was negative.  Symptoms started about 3 days ago.  She has not had any vomiting or diarrhea.  The history is provided by the patient.    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    Past Surgical History:  Procedure Laterality Date   CESAREAN SECTION  11/26/11   CESAREAN SECTION N/A 04/01/2016   Procedure: CESAREAN SECTION;  Surgeon: Nadara Mustard, MD;  Location: [facility] ORS;  Service: Obstetrics;  Laterality: N/A;  Time of birth 18:50 Sex: Female Weight: 7 lbs, 13 oz    CESAREAN SECTION N/A 10/27/2017   Procedure: CESAREAN SECTION;  Surgeon: Vena Austria, MD;  Location: [facility] ORS;  Service: Obstetrics;  Laterality: N/A;   KNEE SURGERY Left 2005   LARYNX SURGERY  2018   TONSILLECTOMY      OB History     Gravida  4   Para  3   Term  3   Preterm      AB  1   Living  3      SAB  1   IAB      Ectopic      Multiple  0   Live Births  3            Home Medications    Prior to Admission medications   Medication Sig Start Date End Date Taking? Authorizing Provider  cyclobenzaprine (FLEXERIL) 10 MG tablet Take 1 tablet (10 mg total) by mouth 3 (three) times daily as needed for  muscle spasms. Patient not taking: Reported on 04/11/2023 03/31/23   Viviano Simas, FNP  Multiple Vitamins-Minerals (HAIR SKIN AND NAILS FORMULA PO) Take by mouth. Patient not taking: Reported on 04/11/2023    [provider]  omeprazole (PRILOSEC) 20 MG capsule TAKE 1 TABLET (20 MG TOTAL) BY MOUTH DAILY Patient not taking: Reported on 04/11/2023 03/27/20 03/27/21  Buckmire, Elana Alm, MD  phentermine (ADIPEX-P) 37.5 MG tablet TAKE 1 TABLET (37.5 MG TOTAL) BY MOUTH DAILY BEFORE BREAKFAST. Patient not taking: Reported on 04/11/2023 08/24/21 09/23/21  Vena Austria, MD  Vilazodone HCl (VIIBRYD) 10 MG TABS 1 po daily x 1 week, then 2 po daily Patient not taking: Reported on 04/11/2023 04/12/22       Family History Family History  Problem Relation Age of Onset   Ovarian cancer Maternal Grandmother    Breast cancer Neg Hx     Social History Social History   Tobacco Use   Smoking status: Never   Smokeless tobacco: Never  Vaping Use   Vaping status: Never Used  Substance Use Topics  Alcohol use: No    Alcohol/week: 0.0 standard drinks of alcohol   Drug use: No     Allergies   Sulfa antibiotics, Sulfasalazine, and Sulfonamide derivatives   Review of Systems Review of Systems  Constitutional:  Positive for fever.  HENT:  Positive for congestion and sore throat. Negative for ear pain.   Eyes:  Negative for discharge and redness.  Respiratory:  Positive for cough. Negative for shortness of breath and wheezing.   Gastrointestinal:  Negative for abdominal pain, diarrhea, nausea and vomiting.  Neurological:  Positive for headaches.     Physical Exam Triage Vital Signs ED Triage Vitals  Encounter Vitals Group     BP 04/11/23 1449 (!) 154/97     Systolic BP Percentile --      Diastolic BP Percentile --      Pulse Rate 04/11/23 1449 (!) 102     Resp 04/11/23 1449 18     Temp 04/11/23 1449 98.8 F (37.1 C)     Temp src --      SpO2 04/11/23 1449 97 %     Weight --       Height --      Head Circumference --      Peak Flow --      Pain Score 04/11/23 1506 4     Pain Loc --      Pain Education --      Exclude from Growth Chart --    No data found.  Updated Vital Signs BP (!) 154/97   Pulse (!) 102   Temp 98.8 F (37.1 C)   Resp 18   SpO2 97%      Physical Exam Vitals and nursing note reviewed.  Constitutional:      General: She is not in acute distress.    Appearance: Normal appearance. She is not ill-appearing.  HENT:     Head: Normocephalic and atraumatic.     Right Ear: Tympanic membrane normal.     Left Ear: Tympanic membrane normal.     Nose: Congestion present.     Mouth/Throat:     Mouth: Mucous membranes are moist.     Pharynx: Posterior oropharyngeal erythema present. No oropharyngeal exudate.  Eyes:     Conjunctiva/sclera: Conjunctivae normal.  Cardiovascular:     Rate and Rhythm: Normal rate and regular rhythm.     Heart sounds: Normal heart sounds. No murmur heard. Pulmonary:     Effort: Pulmonary effort is normal. No respiratory distress.     Breath sounds: Normal breath sounds. No wheezing, rhonchi or rales.  Skin:    General: Skin is warm and dry.  Neurological:     Mental Status: She is alert.  Psychiatric:        Mood and Affect: Mood normal.        Thought Content: Thought content normal.      UC Treatments / Results  Labs (all labs ordered are listed, but only abnormal results are displayed) Labs Reviewed  SARS CORONAVIRUS 2 (TAT 6-24 HRS)  POCT RAPID STREP A (OFFICE)    EKG   Radiology No results found.  Procedures Procedures (including critical care time)  Medications Ordered in UC Medications - No data to display  Initial Impression / Assessment and Plan / UC Course  I have reviewed the triage vital signs and the nursing notes.  Pertinent labs & imaging results that were available during my care of the patient were reviewed by me and considered in  my medical decision making (see chart for  details).    Strep screening negative.  Will screen for COVID and recommended symptomatic treatment, increase fluids and rest.  Encouraged follow-up if no gradual improvement of symptoms or with any worsening symptoms.  Patient expresses understanding.  Final Clinical Impressions(s) / UC Diagnoses   Final diagnoses:  Acute upper respiratory infection   Discharge Instructions   None    ED Prescriptions   None    PDMP not reviewed this encounter.   Tomi Bamberger, PA-C 04/11/23 [phone]

## 2023-09-30 ENCOUNTER — Telehealth: Payer: 59 | Admitting: Physician Assistant

## 2023-09-30 DIAGNOSIS — J208 Acute bronchitis due to other specified organisms: Secondary | ICD-10-CM | POA: Diagnosis not present

## 2023-09-30 MED ORDER — PROMETHAZINE-DM 6.25-15 MG/5ML PO SYRP: 5.0000 mL | ORAL_SOLUTION | Freq: Four times a day (QID) | ORAL | 0 refills | Status: DC | PRN

## 2023-09-30 NOTE — Progress Notes (Signed)
E-Visit for Cough   We are sorry that you are not feeling well.  Here is how we plan to help!  Based on your presentation I believe you most likely have A cough due to a virus.  This is called viral bronchitis and is best treated by rest, plenty of fluids and control of the cough.  You may use Ibuprofen or Tylenol as directed to help your symptoms.     In addition you may use a Prescription cough syrup called Promethazine DM Take 5mL every 6 hours as needed for cough.  From your responses in the eVisit questionnaire you describe inflammation in the upper respiratory tract which is causing a significant cough.  This is commonly called Bronchitis and has four common causes:   Allergies Viral Infections Acid Reflux Bacterial Infection Allergies, viruses and acid reflux are treated by controlling symptoms or eliminating the cause. An example might be a cough caused by taking certain blood pressure medications. You stop the cough by changing the medication. Another example might be a cough caused by acid reflux. Controlling the reflux helps control the cough.     HOME CARE Only take medications as instructed by your medical team. Complete the entire course of an antibiotic. Drink plenty of fluids and get plenty of rest. Avoid close contacts especially the very young and the elderly Cover your mouth if you cough or cough into your sleeve. Always remember to wash your hands A steam or ultrasonic humidifier can help congestion.   GET HELP RIGHT AWAY IF: You develop worsening fever. You become short of breath You cough up blood. Your symptoms persist after you have completed your treatment plan MAKE SURE YOU  Understand these instructions. Will watch your condition. Will get help right away if you are not doing well or get worse.    Thank you for choosing an e-visit.  Your e-visit answers were reviewed by a board certified advanced clinical practitioner to complete your personal care  plan. Depending upon the condition, your plan could have included both over the counter or prescription medications.  Please review your pharmacy choice. Make sure the pharmacy is open so you can pick up prescription now. If there is a problem, you may contact your provider through [employer] and have the prescription routed to another pharmacy.  Your safety is important to [place]. If you have drug allergies check your prescription carefully.   For the next 24 hours you can use MyChart to ask questions about today's visit, request a non-urgent call back, or ask for a work or school excuse. You will get an email in the next two days asking about your experience. I hope that your e-visit has been valuable and will speed your recovery.    I have spent 5 minutes in review of e-visit questionnaire, review and updating patient chart, medical decision making and response to patient.   Margaretann Loveless, PA-C

## 2023-11-03 ENCOUNTER — Telehealth: Payer: 59 | Admitting: Physician Assistant

## 2023-11-03 DIAGNOSIS — B9789 Other viral agents as the cause of diseases classified elsewhere: Secondary | ICD-10-CM | POA: Diagnosis not present

## 2023-11-03 DIAGNOSIS — J019 Acute sinusitis, unspecified: Secondary | ICD-10-CM | POA: Diagnosis not present

## 2023-11-03 MED ORDER — IPRATROPIUM BROMIDE 0.03 % NA SOLN
2.0000 | Freq: Two times a day (BID) | NASAL | 0 refills | Status: AC
Start: 2023-11-03 — End: ?

## 2023-11-03 NOTE — Progress Notes (Signed)
 I have spent 5 minutes in review of e-visit questionnaire, review and updating patient chart, medical decision making and response to patient.   Piedad Climes, PA-C

## 2023-11-03 NOTE — Progress Notes (Signed)
 E-Visit for Sinus Problems  We are sorry that you are not feeling well.  Here is how we plan to help!  Based on what you have shared with me it looks like you have sinusitis.  Sinusitis is inflammation and infection in the sinus cavities of the head.  Based on your presentation I believe you most likely have Acute Viral Sinusitis.This is an infection most likely caused by a virus. There is not specific treatment for viral sinusitis other than to help you with the symptoms until the infection runs its course.  You may use an oral decongestant such as Mucinex D or if you have glaucoma or high blood pressure use plain Mucinex. Saline nasal spray help and can safely be used as often as needed for congestion, I have prescribed: Ipratropium Bromide nasal spray 0.03% 2 sprays in eah nostril 2-3 times a day  Some authorities believe that zinc sprays or the use of Echinacea may shorten the course of your symptoms.  Sinus infections are not as easily transmitted as other respiratory infection, however we still recommend that you avoid close contact with loved ones, especially the very young and elderly.  Remember to wash your hands thoroughly throughout the day as this is the number one way to prevent the spread of infection!  Home Care: Only take medications as instructed by your medical team. Do not take these medications with alcohol. A steam or ultrasonic humidifier can help congestion.  You can place a towel over your head and breathe in the steam from hot water coming from a faucet. Avoid close contacts especially the very young and the elderly. Cover your mouth when you cough or sneeze. Always remember to wash your hands.  Get Help Right Away If: You develop worsening fever or sinus pain. You develop a severe head ache or visual changes. Your symptoms persist after you have completed your treatment plan.  Make sure you Understand these instructions. Will watch your condition. Will get help  right away if you are not doing well or get worse.   Thank you for choosing an e-visit.  Your e-visit answers were reviewed by a board certified advanced clinical practitioner to complete your personal care plan. Depending upon the condition, your plan could have included both over the counter or prescription medications.  Please review your pharmacy choice. Make sure the pharmacy is open so you can pick up prescription now. If there is a problem, you may contact your provider through [employer] and have the prescription routed to another pharmacy.  Your safety is important to [place]. If you have drug allergies check your prescription carefully.   For the next 24 hours you can use MyChart to ask questions about today's visit, request a non-urgent call back, or ask for a work or school excuse. You will get an email in the next two days asking about your experience. I hope that your e-visit has been valuable and will speed your recovery.

## 2023-11-15 ENCOUNTER — Telehealth: Admitting: Physician Assistant

## 2023-11-15 DIAGNOSIS — B9689 Other specified bacterial agents as the cause of diseases classified elsewhere: Secondary | ICD-10-CM

## 2023-11-15 DIAGNOSIS — J019 Acute sinusitis, unspecified: Secondary | ICD-10-CM

## 2023-11-15 MED ORDER — AMOXICILLIN-POT CLAVULANATE 875-125 MG PO TABS
1.0000 | ORAL_TABLET | Freq: Two times a day (BID) | ORAL | 0 refills | Status: DC
Start: 2023-11-15 — End: 2024-01-27

## 2023-11-15 NOTE — Progress Notes (Signed)

## 2024-01-27 ENCOUNTER — Ambulatory Visit
Admission: RE | Admit: 2024-01-27 | Discharge: 2024-01-27 | Disposition: A | Discharge status: Home / Self Care | Source: Ambulatory Visit | Attending: Nurse Practitioner | Admitting: Nurse Practitioner

## 2024-01-27 VITALS — BP 136/86 | HR 103 | Temp 97.8°F | Resp 17

## 2024-01-27 DIAGNOSIS — H60392 Other infective otitis externa, left ear: Secondary | ICD-10-CM

## 2024-01-27 MED ORDER — CIPRO HC 0.2-1 % OT SUSP
3.0000 [drp] | Freq: Two times a day (BID) | OTIC | 0 refills | Status: DC
Start: 2024-01-27 — End: 2024-02-03

## 2024-01-27 MED ORDER — IBUPROFEN 800 MG PO TABS
800.0000 mg | ORAL_TABLET | Freq: Once | ORAL | Status: AC
  Administered 2024-01-27: 800 mg via ORAL

## 2024-01-27 MED ORDER — CIPROFLOXACIN-DEXAMETHASONE 0.3-0.1 % OT SUSP: 4.0000 [drp] | Freq: Two times a day (BID) | OTIC | 0 refills | Status: DC

## 2024-01-27 MED ORDER — AMOXICILLIN 875 MG PO TABS
875.0000 mg | ORAL_TABLET | Freq: Two times a day (BID) | ORAL | 0 refills | Status: DC
Start: 2024-01-27 — End: 2024-02-01

## 2024-01-27 NOTE — ED Provider Notes (Signed)
 [facility]    CSN: [id number] Arrival date & time: 01/27/24  1540      History   Chief Complaint Chief Complaint  Patient presents with  . Ear Fullness    Ear Pain in canal. - Entered by patient    HPI Mallory Pearson is a 38 y.o. female.    Mallory Pearson is a 38 year old female presenting with left ear pain. She reports a history of frequent ear itchiness and admits to using bobby pins and Q-tips in her ears for relief, acknowledging that this is not recommended. Currently, she describes significant swelling in the left ear canal, to the point where she cannot hear well out of the affected ear and the provider is unable to visualize the tympanic membrane or insert a Q-tip. She also notes mild ringing in the left ear. She denies any ear discharge, dizziness, headaches, or systemic symptoms. No associated upper respiratory symptoms such as cough, congestion, sore throat, fever, or vomiting are present.   The following portions of the patient's history were reviewed and updated as appropriate: allergies, current medications, past family history, past medical history, past social history, past surgical history, and problem list    Past Medical History:  Diagnosis Date  . Allergy   . Family history of ovarian cancer    12/21 cancer genetic testing letter sent  . GERD (gastroesophageal reflux disease)   . Gestational diabetes   . History of chicken pox   . Hypertension   . Subglottic stenosis   . Tachycardia     Patient Active Problem List   Diagnosis Date Noted  . Essential hypertension 12/13/2017  . BMI 40.0-44.9, adult (HCC) 10/14/2017  . 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 CESAREAN SECTION  11/26/11  . CESAREAN SECTION N/A 04/01/2016   Procedure: CESAREAN SECTION;  Surgeon: Alben Alma, MD;  Location: [facility];  Service: Obstetrics;  Laterality: N/A;  Time of birth 18:50 Sex: Female Weight: 7 lbs, 13  oz   . CESAREAN SECTION N/A 10/27/2017   Procedure: CESAREAN SECTION;  Surgeon: Darl Edu, MD;  Location: [facility];  Service: Obstetrics;  Laterality: N/A;  . KNEE SURGERY Left 2005  . LARYNX SURGERY  2018  . TONSILLECTOMY      OB History     Gravida  4   Para  3   Term  3   Preterm      AB  1   Living  3      SAB  1   IAB      Ectopic      Multiple  0   Live Births  3            Home Medications    Prior to Admission medications   Medication Sig Start Date End Date Taking? Authorizing Provider  amoxicillin  (AMOXIL ) 875 MG tablet Take 1 tablet (875 mg total) by mouth 2 (two) times daily for 5 days. 01/27/24 02/01/24 Yes Maryruth Sol, FNP  ciprofloxacin-hydrocortisone (CIPRO HC) OTIC suspension Place 3 drops into the left ear 2 (two) times daily for 7 days. 01/27/24 02/03/24 Yes Javana Schey, FNP  ipratropium (ATROVENT ) 0.03 % nasal spray Place 2 sprays into both nostrils every 12 (twelve) hours. 11/03/23   Farris Hong, PA-C    Family History Family History  Problem Relation Age of Onset  . Ovarian cancer Maternal Grandmother   . Breast cancer Neg Hx  Social History Social History   Tobacco Use  . Smoking status: Never  . Smokeless tobacco: Never  Vaping Use  . Vaping status: Never Used  Substance Use Topics  . Alcohol  use: No    Alcohol /week: 0.0 standard drinks of alcohol   . Drug use: No     Allergies   Sulfa antibiotics, Sulfasalazine, and Sulfonamide derivatives   Review of Systems Review of Systems  HENT:  Positive for ear pain (left ear only), hearing loss and tinnitus (intermittently). Negative for ear discharge.   Neurological:  Negative for dizziness and headaches.     Physical Exam Triage Vital Signs ED Triage Vitals  Encounter Vitals Group     BP 01/27/24 1552 136/86     Systolic BP Percentile --      Diastolic BP Percentile --      Pulse Rate 01/27/24 1552 (!) 103     Resp 01/27/24 1552 17      Temp 01/27/24 1552 97.8 F (36.6 C)     Temp Source 01/27/24 1552 Oral     SpO2 01/27/24 1552 97 %     Weight --      Height --      Head Circumference --      Peak Flow --      Pain Score 01/27/24 1555 3     Pain Loc --      Pain Education --      Exclude from Growth Chart --    No data found.  Updated Vital Signs BP 136/86 (BP Location: Right Arm)   Pulse (!) 103   Temp 97.8 F (36.6 C) (Oral)   Resp 17   SpO2 97%   Visual Acuity Right Eye Distance:   Left Eye Distance:   Bilateral Distance:    Right Eye Near:   Left Eye Near:    Bilateral Near:     Physical Exam Vitals reviewed.  Constitutional:      General: She is awake. She is not in acute distress.    Appearance: Normal appearance. She is well-developed. She is not ill-appearing or toxic-appearing.  HENT:     Head: Normocephalic.     Right Ear: Hearing, tympanic membrane, ear canal and external ear normal.     Left Ear: Hearing and external ear normal. Swelling and tenderness present. No drainage. No mastoid tenderness.     Ears:     Comments: Tenderness noted. Unable to visualize TM due to extensive swelling of the left ear canal.     Nose: Nose normal.     Mouth/Throat:     Mouth: Mucous membranes are moist.  Eyes:     Conjunctiva/sclera: Conjunctivae normal.  Cardiovascular:     Rate and Rhythm: Normal rate and regular rhythm.     Heart sounds: Normal heart sounds.  Pulmonary:     Effort: Pulmonary effort is normal.     Breath sounds: Normal breath sounds.  Musculoskeletal:        General: Normal range of motion.  Skin:    General: Skin is warm and dry.  Neurological:     General: No focal deficit present.     Mental Status: She is alert and oriented to person, place, and time.  Psychiatric:        Behavior: Behavior is cooperative.     UC Treatments / Results  Labs (all labs ordered are listed, but only abnormal results are displayed) Labs Reviewed - No data to  display  EKG  Radiology No results found.  Procedures Procedures (including critical care time) Procedure Note: Ear Wick Placement  Indication: Patient presents with otitis externa of the [left/right] ear with significant swelling of the external auditory canal preventing adequate penetration of topical medications.  Procedure: After confirming the diagnosis of otitis externa, the external auditory canal was gently cleaned of excess debris and drainage using a cotton-tipped applicator. A compressed otic wick was moistened with sterile saline and carefully inserted into the swollen external auditory canal using forceps. The wick was placed without resistance and positioned to allow for optimal medication absorption. No bleeding or complications occurred during the procedure. Patient tolerated the procedure well.  Plan: Patient was prescribed [Cipro HC] otic drops to be instilled into the affected ear [twice daily] for 7 days. Patient instructed to keep the wick moist with each application and to avoid inserting anything else into the ear. Patient advised to follow-up in 2 to 3 days for wick removal and reassessment of the canal. Return precautions were reviewed.  Medications Ordered in UC Medications  ibuprofen  (ADVIL ) tablet 800 mg (800 mg Oral Given 01/27/24 1724)    Initial Impression / Assessment and Plan / UC Course  I have reviewed the triage vital signs and the nursing notes.  Pertinent labs & imaging results that were available during my care of the patient were reviewed by me and considered in my medical decision making (see chart for details).    38 year old female presents with left ear pain and reported history of frequent ear itchiness, often using Q-tips or bobby pins in the ears. On exam, patient is alert, oriented, afebrile, and nontoxic. Significant tenderness of the left ear canal is noted, but there is no mastoid tenderness or evidence of fluid behind the ear. The  external auditory canal is significantly swollen, preventing visualization of the tympanic membrane. A wick was placed in the left ear canal without difficulty to facilitate delivery of medication. Cipro HC otic drops and oral amoxicillin  were prescribed to address suspected otitis externa with potential secondary infection. Supportive care measures were discussed, and the patient was counseled on avoiding any further insertion of objects into the ear. She was advised to follow up in 2-3 days for wick removal and reassessment. Indications for immediate return, including increased pain, drainage, fever, or spreading redness, were reviewed.  Today's evaluation has revealed no signs of a dangerous process. Discussed diagnosis with patient and/or guardian. Patient and/or guardian aware of their diagnosis, possible red flag symptoms to watch out for and need for close follow up. Patient and/or guardian understands verbal and written discharge instructions. Patient and/or guardian comfortable with plan and disposition.  Patient and/or guardian has a clear mental status at this time, good insight into illness (after discussion and teaching) and has clear judgment to make decisions regarding their care  Documentation was completed with the aid of voice recognition software. Transcription may contain typographical errors.  Final Clinical Impressions(s) / UC Diagnoses   Final diagnoses:  Infective otitis externa of left ear     Discharge Instructions      You were seen today for an outer ear infection (otitis externa) in your left ear. Due to swelling in the ear canal, a small wick was placed to help the medication reach the infected area. You were prescribed Cipro HC ear drops to use twice a day for 7 days. The drops should be placed directly into the ear canal over the wick. Do not put anything else in your ear,  including water, cotton swabs, or other medications.  You were also prescribed amoxicillin  to  take by mouth twice a day for 5 days, and you may use Tylenol  or ibuprofen  as needed for pain or discomfort.  It is important to return to the clinic in 2 to 3 days to have the wick removed and to check on how your ear is healing. If the ear is still swollen, a new wick may be placed.  Please seek medical attention right away if you develop worsening pain, swelling, drainage with a bad odor, fever, dizziness, hearing loss, or if you feel unwell. These may be signs that the infection is getting worse or spreading.    ED Prescriptions     Medication Sig Dispense Auth. Provider   ciprofloxacin-dexamethasone  (CIPRODEX) OTIC suspension  (Status: Discontinued) Place 4 drops into the left ear 2 (two) times daily for 7 days. 2.8 mL Maryruth Sol, FNP   amoxicillin  (AMOXIL ) 875 MG tablet Take 1 tablet (875 mg total) by mouth 2 (two) times daily for 5 days. 10 tablet Maryruth Sol, FNP   ciprofloxacin-hydrocortisone (CIPRO HC) OTIC suspension Place 3 drops into the left ear 2 (two) times daily for 7 days. 10 mL Maryruth Sol, FNP      PDMP not reviewed this encounter.   Maryruth Sol, [place] 01/27/24 1816

## 2024-01-27 NOTE — Discharge Instructions (Addendum)
 You were seen today for an outer ear infection (otitis externa) in your left ear. Due to swelling in the ear canal, a small wick was placed to help the medication reach the infected area. You were prescribed Cipro HC ear drops to use twice a day for 7 days. The drops should be placed directly into the ear canal over the wick. Do not put anything else in your ear, including water, cotton swabs, or other medications.  You were also prescribed amoxicillin  to take by mouth twice a day for 5 days, and you may use Tylenol  or ibuprofen  as needed for pain or discomfort.  It is important to return to the clinic in 2 to 3 days to have the wick removed and to check on how your ear is healing. If the ear is still swollen, a new wick may be placed.  Please seek medical attention right away if you develop worsening pain, swelling, drainage with a bad odor, fever, dizziness, hearing loss, or if you feel unwell. These may be signs that the infection is getting worse or spreading.

## 2024-01-27 NOTE — ED Triage Notes (Addendum)
Pt c/o left ear pain for 1 day

## 2024-01-29 ENCOUNTER — Ambulatory Visit
Admission: RE | Admit: 2024-01-29 | Discharge: 2024-01-29 | Disposition: A | Discharge status: Home / Self Care | Source: Ambulatory Visit | Attending: Internal Medicine | Admitting: Internal Medicine

## 2024-01-29 VITALS — BP 156/94 | HR 82 | Temp 98.1°F | Resp 17

## 2024-01-29 DIAGNOSIS — H60392 Other infective otitis externa, left ear: Secondary | ICD-10-CM | POA: Diagnosis not present

## 2024-01-29 DIAGNOSIS — H9202 Otalgia, left ear: Secondary | ICD-10-CM

## 2024-01-29 DIAGNOSIS — H938X2 Other specified disorders of left ear: Secondary | ICD-10-CM | POA: Diagnosis not present

## 2024-01-29 MED ORDER — PREDNISONE 20 MG PO TABS: 40.0000 mg | ORAL_TABLET | Freq: Every day | ORAL | 0 refills | Status: AC

## 2024-01-29 MED ORDER — FLUCONAZOLE 150 MG PO TABS: 150.0000 mg | ORAL_TABLET | ORAL | 0 refills | Status: AC

## 2024-01-29 MED ORDER — OFLOXACIN 0.3 % OT SOLN: 10.0000 [drp] | Freq: Every day | OTIC | 0 refills | Status: AC

## 2024-01-29 MED ORDER — ONDANSETRON 4 MG PO TBDP: 4.0000 mg | ORAL_TABLET | Freq: Three times a day (TID) | ORAL | 0 refills | Status: AC | PRN

## 2024-01-29 MED ORDER — HYDROCODONE-ACETAMINOPHEN 5-325 MG PO TABS: 1.0000 | ORAL_TABLET | Freq: Four times a day (QID) | ORAL | 0 refills | Status: AC | PRN

## 2024-01-29 MED ORDER — AMOXICILLIN-POT CLAVULANATE 875-125 MG PO TABS: 1.0000 | ORAL_TABLET | Freq: Two times a day (BID) | ORAL | 0 refills | Status: AC

## 2024-01-29 NOTE — ED Triage Notes (Signed)
 Pt presents to have ear wick removed that was placed on Friday. She is still having severe left ear pain

## 2024-01-29 NOTE — ED Provider Notes (Signed)
 [facility]    CSN: [id number] Arrival date & time: 01/29/24  0825      History   Chief Complaint No chief complaint on file.   HPI Mallory Pearson is a 38 y.o. female.   Mallory Pearson is a 38 y.o. female presenting for chief complaint of left ear pain that started several days ago.  She came to urgent care 2 days ago where she was diagnosed with otitis externa and a wick was placed to the left ear canal due to ear swelling.  Urgent care provider 2 days ago was unable to see her left tympanic membrane and prescribed amoxicillin  875 mg twice daily and Cipro-hydrocortisone drops to treat swelling of the ear canal.  Patient has been using these medications as prescribed and states her pain has worsened.  She feels that the wick is irritating the left ear canal more than helping.  She is here to have it removed today.  She denies recent fever, chills, body aches, and dizziness. She reports intermittent nausea without vomiting that she attributes to being caused by the left ear pain. Left ear pain is severe and sometimes brings her to tears at nighttime due to severity of pain while trying to sleep. She is taking Tylenol  and ibuprofen  for pain with minimal relief.  Pain is worsened by manipulation of the outer ear.    Past Medical History:  Diagnosis Date  . Allergy   . Family history of ovarian cancer    12/21 cancer genetic testing letter sent  . GERD (gastroesophageal reflux disease)   . Gestational diabetes   . History of chicken pox   . Hypertension   . Subglottic stenosis   . Tachycardia     Patient Active Problem List   Diagnosis Date Noted  . Essential hypertension 12/13/2017  . BMI 40.0-44.9, adult (HCC) 10/14/2017  . 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 CESAREAN SECTION  11/26/11  . CESAREAN SECTION N/A 04/01/2016   Procedure: CESAREAN SECTION;  Surgeon: Alben Alma, MD;  Location: [facility] OR[facility];   Service: Obstetrics;  Laterality: N/A;  Time of birth 18:50 Sex: Female Weight: 7 lbs, 13 oz   . CESAREAN SECTION N/A 10/27/2017   Procedure: CESAREAN SECTION;  Surgeon: Darl Edu, MD;  Location: [facility];  Service: Obstetrics;  Laterality: N/A;  . KNEE SURGERY Left 2005  . LARYNX SURGERY  2018  . TONSILLECTOMY      OB History     Gravida  4   Para  3   Term  3   Preterm      AB  1   Living  3      SAB  1   IAB      Ectopic      Multiple  0   Live Births  3            Home Medications    Prior to Admission medications   Medication Sig Start Date End Date Taking? Authorizing Provider  amoxicillin -clavulanate (AUGMENTIN ) 875-125 MG tablet Take 1 tablet by mouth every 12 (twelve) hours. 01/29/24  Yes Starlene Eaton, FNP  fluconazole (DIFLUCAN) 150 MG tablet Take 1 tablet (150 mg total) by mouth every 3 (three) days. 01/29/24  Yes Malcolm Quast, Danny Dye, FNP  HYDROcodone-acetaminophen  (NORCO/VICODIN) 5-325 MG tablet Take 1 tablet by mouth every 6 (six) hours as needed. 01/29/24  Yes Starlene Eaton, FNP  ofloxacin (FLOXIN) 0.3 %  OTIC solution Place 10 drops into the left ear daily for 7 days. 01/29/24 02/05/24 Yes Starlene Eaton, FNP  ondansetron  (ZOFRAN -ODT) 4 MG disintegrating tablet Take 1 tablet (4 mg total) by mouth every 8 (eight) hours as needed for nausea or vomiting. 01/29/24  Yes Starlene Eaton, FNP  predniSONE  (DELTASONE ) 20 MG tablet Take 2 tablets (40 mg total) by mouth daily with breakfast for 5 days. 01/29/24 02/03/24 Yes Starlene Eaton, FNP  ipratropium (ATROVENT ) 0.03 % nasal spray Place 2 sprays into both nostrils every 12 (twelve) hours. 11/03/23   Farris Hong, PA-C    Family History Family History  Problem Relation Age of Onset  . Ovarian cancer Maternal Grandmother   . Breast cancer Neg Hx     Social History Social History   Tobacco Use  . Smoking status: Never  . Smokeless tobacco: Never  Vaping  Use  . Vaping status: Never Used  Substance Use Topics  . Alcohol  use: No    Alcohol /week: 0.0 standard drinks of alcohol   . Drug use: No     Allergies   Sulfa antibiotics, Sulfasalazine, and Sulfonamide derivatives   Review of Systems Review of Systems Per HPI  Physical Exam Triage Vital Signs ED Triage Vitals  Encounter Vitals Group     BP 01/29/24 0833 (!) 156/94     Systolic BP Percentile --      Diastolic BP Percentile --      Pulse Rate 01/29/24 0833 82     Resp 01/29/24 0833 17     Temp 01/29/24 0833 98.1 F (36.7 C)     Temp Source 01/29/24 0833 Oral     SpO2 01/29/24 0833 98 %     Weight --      Height --      Head Circumference --      Peak Flow --      Pain Score 01/29/24 0832 4     Pain Loc --      Pain Education --      Exclude from Growth Chart --    No data found.  Updated Vital Signs BP (!) 156/94 (BP Location: Right Arm)   Pulse 82   Temp 98.1 F (36.7 C) (Oral)   Resp 17   SpO2 98%   Visual Acuity Right Eye Distance:   Left Eye Distance:   Bilateral Distance:    Right Eye Near:   Left Eye Near:    Bilateral Near:     Physical Exam Vitals and nursing note reviewed.  Constitutional:      Appearance: Normal appearance. She is not ill-appearing or toxic-appearing.  HENT:     Head: Normocephalic and atraumatic.     Right Ear: Hearing, tympanic membrane, ear canal and external ear normal.     Left Ear: Hearing normal. Drainage, swelling and tenderness present. A foreign body (Ear wick-removed easily with alligator forceps intact) is present.     Ears:     Comments: Ear wick visualized on external exam of the left ear.  This was removed with alligator forceps easily. Unable to visualize left TM due to swelling and erythema of the left ear canal  after removal of the ear wick. Purulent and thick drainage to the floor of the left ear canal.  Significant tenderness with manipulation of the tragus and auricle as well as with insertion of the  otoscope.    Nose: Nose normal.     Mouth/Throat:     Lips:  Pink.     Mouth: Mucous membranes are moist. No injury or oral lesions.     Dentition: Normal dentition.     Tongue: No lesions.     Pharynx: Oropharynx is clear. Uvula midline. No pharyngeal swelling, oropharyngeal exudate, posterior oropharyngeal erythema, uvula swelling or postnasal drip.     Tonsils: No tonsillar exudate.  Eyes:     General: Lids are normal. Vision grossly intact. Gaze aligned appropriately.     Extraocular Movements: Extraocular movements intact.     Conjunctiva/sclera: Conjunctivae normal.  Neck:     Trachea: Trachea and phonation normal.  Pulmonary:     Effort: Pulmonary effort is normal.  Musculoskeletal:     Cervical back: Neck supple.  Lymphadenopathy:     Cervical: No cervical adenopathy.  Skin:    General: Skin is warm and dry.     Capillary Refill: Capillary refill takes less than 2 seconds.     Findings: No rash.  Neurological:     General: No focal deficit present.     Mental Status: She is alert and oriented to person, place, and time. Mental status is at baseline.     Cranial Nerves: No dysarthria or facial asymmetry.  Psychiatric:        Mood and Affect: Mood normal.        Speech: Speech normal.        Behavior: Behavior normal.        Thought Content: Thought content normal.        Judgment: Judgment normal.     UC Treatments / Results  Labs (all labs ordered are listed, but only abnormal results are displayed) Labs Reviewed - No data to display  EKG   Radiology No results found.  Procedures Procedures (including critical care time)  Medications Ordered in UC Medications - No data to display  Initial Impression / Assessment and Plan / UC Course  I have reviewed the triage vital signs and the nursing notes.  Pertinent labs & imaging results that were available during my care of the patient were reviewed by me and considered in my medical decision making (see chart  for details).   1.  Other infective  acute otitis externa of left ear, swelling of left ear, acute left otalgia Ear wick removed easily with alligator forceps in entirety. I am unable to see the TM of the left ear, therefore I would like for her to discontinue the Cipro-hydrocortisone drops due to risks for ototoxicity.  Augmentin  prescribed to treat severe otitis externa. Prednisone  burst ordered to treat swelling.  *** Final Clinical Impressions(s) / UC Diagnoses   Final diagnoses:  Other infective acute otitis externa of left ear  Swelling of left ear  Acute otalgia, left     Discharge Instructions      Stop taking amoxicillin  and start taking Augmentin  antibiotic twice daily for the next 7 days. This  is a slightly stronger antibiotic and will treat your infection a little bit better.  Take prednisone  40 mg once daily for the next 5 days with food. Do not take any ibuprofen  or other NSAIDs when taking prednisone  and always take with a snack or a meal.  Apply 10 drops of ofloxacin eardrops to the left ear canal once daily by laying on your right side for approximately 15 minutes after applying the drops.  Place a cottonball into the left ear canal when you are showering to avoid getting water into the left ear canal.  You may  take 1 pill of hydrocodone/acetaminophen  at bedtime as needed for severe pain. Once your pain is well-controlled with the hydrocodone-acetaminophen , switch back to only using Tylenol  regular strength as needed for pain relief. The prednisone  will help significantly with the swelling and therefore this will help with the pain.  I have provided a prescription for Zofran  that you may use every 8 hours as needed for nausea associated with pain.  I have also ordered Diflucan to treat empirically for acute vaginal yeast infection associated with antibiotic use. Take this if you develop vaginal itching or thick white vaginal discharge after taking  antibiotics.  I have provided a work note at the back of your packet.  Please follow-up with either urgent care, PCP, or ear nose and throat provider or whoever can get you seen first for recheck in the next 5 to 7 days to ensure that your infection is improving.  If you develop fever, chills, nausea, vomiting, dizziness, or any other new or worsening symptoms, please return to urgent care for reevaluation.  If symptoms are severe, please go to the nearest emergency room.  I hope you feel better!   ED Prescriptions     Medication Sig Dispense Auth. Provider   amoxicillin -clavulanate (AUGMENTIN ) 875-125 MG tablet Take 1 tablet by mouth every 12 (twelve) hours. 14 tablet Lama Narayanan M, FNP   HYDROcodone-acetaminophen  (NORCO/VICODIN) 5-325 MG tablet Take 1 tablet by mouth every 6 (six) hours as needed. 4 tablet Starlene Eaton, FNP   predniSONE  (DELTASONE ) 20 MG tablet Take 2 tablets (40 mg total) by mouth daily with breakfast for 5 days. 10 tablet Nariya Neumeyer M, FNP   ofloxacin (FLOXIN) 0.3 % OTIC solution Place 10 drops into the left ear daily for 7 days. 5 mL Shella Devoid M, FNP   ondansetron  (ZOFRAN -ODT) 4 MG disintegrating tablet Take 1 tablet (4 mg total) by mouth every 8 (eight) hours as needed for nausea or vomiting. 20 tablet Cherly Erno M, FNP   fluconazole (DIFLUCAN) 150 MG tablet Take 1 tablet (150 mg total) by mouth every 3 (three) days. 2 tablet Starlene Eaton, FNP      I have reviewed the PDMP during this encounter.

## 2024-01-29 NOTE — Discharge Instructions (Addendum)
 Stop taking amoxicillin  and start taking Augmentin  antibiotic twice daily for the next 7 days. This  is a slightly stronger antibiotic and will treat your infection a little bit better.  Take prednisone  40 mg once daily for the next 5 days with food. Do not take any ibuprofen  or other NSAIDs when taking prednisone  and always take with a snack or a meal.  Apply 10 drops of ofloxacin eardrops to the left ear canal once daily by laying on your right side for approximately 15 minutes after applying the drops.  Place a cottonball into the left ear canal when you are showering to avoid getting water into the left ear canal.  You may take 1 pill of hydrocodone/acetaminophen  at bedtime as needed for severe pain. Once your pain is well-controlled with the hydrocodone-acetaminophen , switch back to only using Tylenol  regular strength as needed for pain relief. The prednisone  will help significantly with the swelling and therefore this will help with the pain.  I have provided a prescription for Zofran  that you may use every 8 hours as needed for nausea associated with pain.  I have also ordered Diflucan to treat empirically for acute vaginal yeast infection associated with antibiotic use. Take this if you develop vaginal itching or thick white vaginal discharge after taking antibiotics.  I have provided a work note at the back of your packet.  Please follow-up with either urgent care, PCP, or ear nose and throat provider or whoever can get you seen first for recheck in the next 5 to 7 days to ensure that your infection is improving.  If you develop fever, chills, nausea, vomiting, dizziness, or any other new or worsening symptoms, please return to urgent care for reevaluation.  If symptoms are severe, please go to the nearest emergency room.  I hope you feel better!

## 2024-02-06 ENCOUNTER — Ambulatory Visit
Admission: RE | Admit: 2024-02-06 | Discharge: 2024-02-06 | Disposition: A | Discharge status: Home / Self Care | Source: Ambulatory Visit | Attending: Family Medicine | Admitting: Family Medicine

## 2024-02-06 VITALS — BP 136/95 | HR 100 | Temp 98.9°F | Resp 18

## 2024-02-06 DIAGNOSIS — H60392 Other infective otitis externa, left ear: Secondary | ICD-10-CM | POA: Diagnosis not present

## 2024-02-06 NOTE — ED Triage Notes (Signed)
 Patient here for a f/u on an ear infection in the left ear.  Patient states the swelling has subsided, achy at times.  Antibiotics and steroids completed.

## 2024-02-06 NOTE — Discharge Instructions (Addendum)
 Your ear looks so much better!   Continue ofloxacin  ear drops for 3 more days, then stop medication.  Nothing into the ear canals, even q-tips!   No swimming or submerging head under water for 2 more weeks.  If you develop any new or worsening symptoms or if your symptoms do not start to improve, please return here or follow-up with your primary care provider. If your symptoms are severe, please go to the emergency room.

## 2024-02-06 NOTE — ED Provider Notes (Signed)
 [facility]    CSN: [id number] Arrival date & time: 02/06/24  0841      History   Chief Complaint Chief Complaint  Patient presents with   Ear Fullness    Entered by patient    HPI Mallory  [name] is a 38 y.o. female.   Patient presents to urgent care for re-check of left ear after receiving treatment for left otitis externa. Seen initially 01/27/2024 at urgent care, diagnosed with AOE, and placed on ciprodex  and amoxicillin  without visualization of the left TM. Symptoms weren't improving so she returned to urgent care 01/29/2024 and was found to have worsening signs of infection with swelling to the ear canal consistent with AOE with probable AOM. She was prescribed Augmentin  BID for 7 days and ofloxacin  drops as the TM was still obstructed due to drainage in the canal.  She presents today for re-check to ensure infection has improved. She took all of antibiotic as prescribed without difficulty. States symptoms have improved significantly. She still has a little itching to the left ear canal but states ears are relatively itchy at baseline. Pain to the left ear has resolved completely.  No recent fever, chills, neck pain, headache, dizziness, tinnitus, decreased hearing, etc.    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    Past Surgical History:  Procedure Laterality Date   CESAREAN SECTION  11/26/11   CESAREAN SECTION N/A 04/01/2016   Procedure: CESAREAN SECTION;  Surgeon: Mallory Alma, MD;  Location: [facility] ORS;  Service: Obstetrics;  Laterality: N/A;   Time of birth 18:[birth year] Sex: Female Weight: 7 lbs, 13 oz    CESAREAN SECTION N/A 10/27/2017   Procedure: CESAREAN SECTION;  Surgeon: Mallory Edu, MD;  Location: [facility] OR[facility];  Service: Obstetrics;  Laterality: N/A;   KNEE SURGERY Left 2005   LARYNX SURGERY  2018   TONSILLECTOMY      OB History     Gravida  4   Para  3   Term  3   Preterm      AB  1   Living  3      SAB  1   IAB      Ectopic      Multiple  0   Live Births  3            Home Medications    Prior to Admission medications   Medication Sig Start Date End Date Taking? Authorizing Provider  amoxicillin -clavulanate (AUGMENTIN ) 875-125 MG tablet Take 1 tablet by mouth every 12 (twelve) hours. 01/29/24  Yes Mallory Eaton, FNP  fluconazole  (DIFLUCAN ) 150 MG tablet Take 1 tablet (150 mg total) by mouth every 3 (three) days. 01/29/24  Yes Mallory Eaton, FNP  HYDROcodone -acetaminophen  (NORCO/VICODIN) 5-325 MG tablet Take 1 tablet by mouth every 6 (six) hours as needed. 01/29/24  Yes Mallory Eaton, FNP  ipratropium (ATROVENT ) 0.03 % nasal spray Place 2 sprays into both nostrils every 12 (twelve) hours. 11/03/23  Yes Mallory Pearson  C, PA-Pearson  ondansetron  (ZOFRAN -ODT) 4 MG disintegrating tablet Take 1 tablet (4 mg total) by mouth every 8 (eight) hours as needed for nausea or vomiting. 01/29/24  Yes Mallory Eaton, FNP    Family History Family History  Problem Relation Age of Onset   Ovarian cancer Maternal Grandmother    Breast cancer Neg Hx     Social History Social History   Tobacco Use   Smoking status: Never   Smokeless tobacco: Never  Vaping Use   Vaping status: Never Used  Substance Use Topics   Alcohol  use: No    Alcohol /week: 0.0 standard drinks of alcohol    Drug use: No     Allergies   Sulfa antibiotics, Sulfasalazine, and Sulfonamide derivatives   Review of Systems Review of Systems Per HPI  Physical Exam Triage Vital Signs Mallory Triage Vitals  Encounter  Vitals Group     BP 02/06/24 0852 (!) 136/95     Systolic BP Percentile --      Diastolic BP Percentile --      Pulse Rate 02/06/24 0852 100     Resp 02/06/24 0852 18     Temp 02/06/24 0852 98.9 F (37.2 Pearson)     Temp Source 02/06/24 0852 Oral     SpO2 02/06/24 0852 99 %     Weight --      Height --      Head Circumference --      Peak Flow --      Pain Score 02/06/24 0854 2     Pain Loc --      Pain Education --      Exclude from Growth Chart --    No data found.  Updated Vital Signs BP (!) 136/95 (BP Location: Right Arm)   Pulse 100   Temp 98.9 F (37.2 Pearson) (Oral)   Resp 18   SpO2 99%   Visual Acuity Right Eye Distance:   Left Eye Distance:   Bilateral Distance:    Right Eye Near:   Left Eye Near:    Bilateral Near:     Physical Exam Vitals and nursing note reviewed.  Constitutional:      Appearance: She is not ill-appearing or toxic-appearing.  HENT:     Head: Normocephalic and atraumatic.     Right Ear: Hearing, tympanic membrane, ear canal and external ear normal.     Left Ear: Hearing, tympanic membrane, ear canal and external ear normal. No drainage or tenderness. Tympanic membrane is not perforated, erythematous or bulging.     Nose: Nose normal.     Mouth/Throat:     Lips: Pink.     Mouth: Mucous membranes are moist. No injury or oral lesions.     Dentition: Normal dentition.     Tongue: No lesions.     Pharynx: Oropharynx is clear. Uvula midline. No pharyngeal swelling, oropharyngeal exudate, posterior oropharyngeal erythema, uvula swelling or postnasal drip.     Tonsils: No tonsillar exudate.  Eyes:     General: Lids are normal. Vision grossly intact. Gaze aligned appropriately.     Extraocular Movements: Extraocular movements intact.     Conjunctiva/sclera: Conjunctivae normal.  Neck:     Trachea: Trachea and phonation normal.  Pulmonary:     Effort: Pulmonary effort is normal.  Musculoskeletal:     Cervical back: Neck supple.  Lymphadenopathy:      Cervical: No cervical adenopathy.  Skin:    General: Skin is warm and dry.  Capillary Refill: Capillary refill takes less than 2 seconds.     Findings: No rash.  Neurological:     General: No focal deficit present.     Mental Status: She is alert and oriented to person, place, and time. Mental status is at baseline.     Cranial Nerves: No dysarthria or facial asymmetry.  Psychiatric:        Mood and Affect: Mood normal.        Speech: Speech normal.        Behavior: Behavior normal.        Thought Content: Thought content normal.        Judgment: Judgment normal.      UC Treatments / Results  Labs (all labs ordered are listed, but only abnormal results are displayed) Labs Reviewed - No data to display  EKG   Radiology No results found.  Procedures Procedures (including critical care time)  Medications Ordered in UC Medications - No data to display  Initial Impression / Assessment and Plan / UC Course  I have reviewed the triage vital signs and the nursing notes.  Pertinent labs & imaging results that were available during my care of the patient were reviewed by me and considered in my medical decision making (see chart for details).   1. Other infective acute otitis externa of left ear Left ear exam appears to be significantly improved since last visit when I saw her 7 days ago.  Advised continued lifestyle modifications to avoid water into the left ear canal.  Advised to use 3 more days of ofloxacin  to total 10 days, then stop using this.  Return if symptoms return.  Counseled patient on potential for adverse effects with medications prescribed/recommended today, strict ER and return-to-clinic precautions discussed, patient verbalized understanding.    Final Clinical Impressions(s) / UC Diagnoses   Final diagnoses:  Other infective acute otitis externa of left ear   Discharge Instructions      Your ear looks so much better!   Continue ofloxacin  ear  drops for 3 more days, then stop medication.  Nothing into the ear canals, even q-tips!   No swimming or submerging head under water for 2 more weeks.  If you develop any new or worsening symptoms or if your symptoms do not start to improve, please return here or follow-up with your primary care provider. If your symptoms are severe, please go to the emergency room.   Mallory Prescriptions   None    PDMP not reviewed this encounter.   Mallory Pearson, [place] 02/09/24 2203

## 2024-03-01 ENCOUNTER — Encounter (HOSPITAL_COMMUNITY): Payer: Self-pay

## 2024-03-01 ENCOUNTER — Other Ambulatory Visit: Payer: Self-pay

## 2024-03-01 ENCOUNTER — Ambulatory Visit (HOSPITAL_COMMUNITY)
Admission: RE | Admit: 2024-03-01 | Discharge: 2024-03-01 | Disposition: A | Discharge status: Home / Self Care | Source: Ambulatory Visit | Attending: Family Medicine | Admitting: Family Medicine

## 2024-03-01 VITALS — BP 139/90 | HR 92 | Temp 98.4°F | Resp 18

## 2024-03-01 DIAGNOSIS — L02212 Cutaneous abscess of back [any part, except buttock]: Secondary | ICD-10-CM

## 2024-03-01 MED ORDER — DOXYCYCLINE HYCLATE 100 MG PO CAPS: 100.0000 mg | ORAL_CAPSULE | Freq: Two times a day (BID) | ORAL | 0 refills | Status: AC

## 2024-03-01 NOTE — ED Provider Notes (Signed)
 [facility]    CSN: [id number] Arrival date & time: 03/01/24  1254      History   Chief Complaint Chief Complaint  Patient presents with   Cyst    HPI Mallory Pearson is a 38 y.o. female.   Mallory Pearson is a 38 y.o. female presenting for chief complaint of possible cyst to the left upper back near the bra line that started approximately 4 days ago.  Cyst began as a small red bump and has now grown significantly in size and pain over the last 2 to 3 days.  She tried to expel some drainage from the cyst and was able to get a little bit of pus out but the rest of the cyst is very hard to touch.  She has been using warm compresses with some relief of pain and swelling.  Denies fever, chills, nausea, vomiting, injury to the area of concern, and bodyaches.  She had amoxicillin  and Augmentin  1 month ago for infective otitis externa, otherwise no other recent antibiotics in the last 90 days reported.  Denies history of immunosuppression.  Denies chance of pregnancy, she has an IUD.     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    Past Surgical History:  Procedure Laterality Date   CESAREAN SECTION  11/26/11   CESAREAN SECTION N/A 04/01/2016   Procedure: CESAREAN SECTION;  Surgeon: Lamar SHAUNNA Lesches, MD;  Location: [facility];  Service: Obstetrics;  Laterality: N/A;  Time of birth 18:50 Sex: Female Weight: 7 lbs, 13 oz    CESAREAN SECTION N/A 10/27/2017   Procedure: CESAREAN SECTION;  Surgeon: Lake Read, MD;  Location: [facility];  Service: Obstetrics;  Laterality: N/A;    KNEE SURGERY Left 2005   LARYNX SURGERY  2018   TONSILLECTOMY      OB History     Gravida  4   Para  3   Term  3   Preterm      AB  1   Living  3      SAB  1   IAB      Ectopic      Multiple  0   Live Births  3            Home Medications    Prior to Admission medications   Medication Sig Start Date End Date Taking? Authorizing Provider  doxycycline (VIBRAMYCIN) 100 MG capsule Take 1 capsule (100 mg total) by mouth 2 (two) times daily for 7 days. 03/01/24 03/08/24 Yes Enedelia Dorna HERO, FNP  amoxicillin -clavulanate (AUGMENTIN ) 875-125 MG tablet Take 1 tablet by mouth every 12 (twelve) hours. 01/29/24   Enedelia Dorna HERO, FNP  fluconazole  (DIFLUCAN ) 150 MG tablet Take 1 tablet (150 mg total) by mouth every 3 (three) days. 01/29/24   Enedelia Dorna HERO, FNP  HYDROcodone -acetaminophen  (NORCO/VICODIN) 5-325 MG tablet Take 1 tablet by mouth every 6 (six) hours as needed. 01/29/24   Enedelia Dorna HERO, FNP  ipratropium (ATROVENT ) 0.03 % nasal spray Place  2 sprays into both nostrils every 12 (twelve) hours. 11/03/23   Gladis Elsie BROCKS, PA-C  ondansetron  (ZOFRAN -ODT) 4 MG disintegrating tablet Take 1 tablet (4 mg total) by mouth every 8 (eight) hours as needed for nausea or vomiting. 01/29/24   Enedelia Dorna HERO, FNP    Family History Family History  Problem Relation Age of Onset   Ovarian cancer Maternal Grandmother    Breast cancer Neg Hx     Social History Social History   Tobacco Use   Smoking status: Never   Smokeless tobacco: Never  Vaping Use   Vaping status: Never Used  Substance Use Topics   Alcohol  use: Yes   Drug use: No     Allergies   Sulfa antibiotics, Sulfasalazine, and Sulfonamide derivatives   Review of Systems Review of Systems Per HPI  Physical Exam Triage Vital Signs ED Triage Vitals  Encounter Vitals Group     BP 03/01/24 1317 (!) 139/90     Girls Systolic BP Percentile --      Girls Diastolic BP Percentile --       Boys Systolic BP Percentile --      Boys Diastolic BP Percentile --      Pulse Rate 03/01/24 1317 92     Resp 03/01/24 1317 18     Temp 03/01/24 1317 98.4 F (36.9 C)     Temp Source 03/01/24 1317 Oral     SpO2 03/01/24 1317 97 %     Weight --      Height --      Head Circumference --      Peak Flow --      Pain Score 03/01/24 1314 4     Pain Loc --      Pain Education --      Exclude from Growth Chart --    No data found.  Updated Vital Signs BP (!) 139/90 (BP Location: Right Arm)   Pulse 92   Temp 98.4 F (36.9 C) (Oral)   Resp 18   SpO2 97%   Visual Acuity Right Eye Distance:   Left Eye Distance:   Bilateral Distance:    Right Eye Near:   Left Eye Near:    Bilateral Near:     Physical Exam Vitals and nursing note reviewed.  Constitutional:      Appearance: She is not ill-appearing or toxic-appearing.  HENT:     Head: Normocephalic and atraumatic.     Right Ear: Hearing and external ear normal.     Left Ear: Hearing and external ear normal.     Nose: Nose normal.     Mouth/Throat:     Lips: Pink.   Eyes:     General: Lids are normal. Vision grossly intact. Gaze aligned appropriately.     Extraocular Movements: Extraocular movements intact.     Conjunctiva/sclera: Conjunctivae normal.   Pulmonary:     Effort: Pulmonary effort is normal.   Musculoskeletal:     Cervical back: Neck supple.   Skin:    General: Skin is warm and dry.     Capillary Refill: Capillary refill takes less than 2 seconds.     Findings: Abscess (Abscess to the left upper back/side as seen in image below.  Abscess is nonfluctuant, approximately 4 cm x 1.5 cm, and is very erythematous/swollen.  Nondraining.  Surrounding induration of the skin is present.) and erythema present. No acne, laceration, petechiae, rash or wound.       Neurological:  General: No focal deficit present.     Mental Status: She is alert and oriented to person, place, and time. Mental status is at  baseline.     Cranial Nerves: No dysarthria or facial asymmetry.   Psychiatric:        Mood and Affect: Mood normal.        Speech: Speech normal.        Behavior: Behavior normal.        Thought Content: Thought content normal.        Judgment: Judgment normal.    Left upper back abscess   UC Treatments / Results  Labs (all labs ordered are listed, but only abnormal results are displayed) Labs Reviewed - No data to display  EKG   Radiology No results found.  Procedures Procedures (including critical care time)  Medications Ordered in UC Medications - No data to display  Initial Impression / Assessment and Plan / UC Course  I have reviewed the triage vital signs and the nursing notes.  Pertinent labs & imaging results that were available during my care of the patient were reviewed by me and considered in my medical decision making (see chart for details).   1. Abscess of upper back excluding scapular region Abscess appears infected, though is immature and is not ready for drainage.  Doxycycline antibiotic ordered, discussed supportive care and wound care as outlined in AVS. Return for I&D if abscess fails to drain on its own in the next 2-3 days with use of antibiotic and supportive care.   Counseled patient on potential for adverse effects with medications prescribed/recommended today, strict ER and return-to-clinic precautions discussed, patient verbalized understanding.    Final Clinical Impressions(s) / UC Diagnoses   Final diagnoses:  Abscess of upper back excluding scapular region     Discharge Instructions      Your abscess has been evaluated in the clinic and appears to be infected, but is not quite ready for drainage.   I would like for you to perform warm compresses to the area frequently and continue taking over the counter medications for pain and inflammation as directed.   Start taking antibiotic sent to pharmacy as directed. This will help  reduce infection to area and help the abscess mature/drain.   If abscess does not begin to drain on its own over the next few days and becomes softer, please return to urgent care for this to be drained appropriately.   If you have any fever, chills, nausea, vomiting, or worsening pain/swelling to the area, please return to urgent care for reevaluation.    ED Prescriptions     Medication Sig Dispense Auth. Provider   doxycycline (VIBRAMYCIN) 100 MG capsule Take 1 capsule (100 mg total) by mouth 2 (two) times daily for 7 days. 14 capsule Enedelia Dorna HERO, FNP      PDMP not reviewed this encounter.   Enedelia Dorna HERO, [place] 03/01/24 1407

## 2024-03-01 NOTE — Discharge Instructions (Signed)

## 2024-03-01 NOTE — ED Triage Notes (Addendum)
 Reports a knot on her back for 4 days.  Red raised area on left side of body.  Bra lays on top of this bump

## 2024-09-10 ENCOUNTER — Encounter: Admitting: Physician Assistant

## 2024-09-10 NOTE — Progress Notes (Signed)
 Patient chose to cancel appt due to not being able to prescribe Tussionex.   Mark erroneous.

## 2024-09-10 NOTE — Telephone Encounter (Signed)
 This encounter was created in error - please disregard.
